# Patient Record
Sex: Female | Born: 1975 | Hispanic: No | Marital: Married | Smoking: Current some day smoker
Health system: Southern US, Community
[De-identification: ages and names within clinical notes are randomized; demographics above are authoritative.]

## PROBLEM LIST (undated history)

## (undated) DIAGNOSIS — Z5189 Encounter for other specified aftercare: Secondary | ICD-10-CM

## (undated) DIAGNOSIS — E785 Hyperlipidemia, unspecified: Secondary | ICD-10-CM

## (undated) DIAGNOSIS — K5900 Constipation, unspecified: Secondary | ICD-10-CM

## (undated) DIAGNOSIS — F909 Attention-deficit hyperactivity disorder, unspecified type: Secondary | ICD-10-CM

## (undated) DIAGNOSIS — R159 Full incontinence of feces: Secondary | ICD-10-CM

## (undated) DIAGNOSIS — E1065 Type 1 diabetes mellitus with hyperglycemia: Secondary | ICD-10-CM

## (undated) DIAGNOSIS — IMO0002 Reserved for concepts with insufficient information to code with codable children: Secondary | ICD-10-CM

## (undated) DIAGNOSIS — K219 Gastro-esophageal reflux disease without esophagitis: Secondary | ICD-10-CM

## (undated) DIAGNOSIS — F4310 Post-traumatic stress disorder, unspecified: Secondary | ICD-10-CM

## (undated) DIAGNOSIS — D649 Anemia, unspecified: Secondary | ICD-10-CM

## (undated) HISTORY — DX: Post-traumatic stress disorder, unspecified: F43.10

## (undated) HISTORY — DX: Hyperlipidemia, unspecified: E78.5

## (undated) HISTORY — PX: TUBAL LIGATION: SHX77

## (undated) HISTORY — DX: Constipation, unspecified: K59.00

## (undated) HISTORY — DX: Type 1 diabetes mellitus with hyperglycemia: E10.65

## (undated) HISTORY — DX: Encounter for other specified aftercare: Z51.89

## (undated) HISTORY — DX: Full incontinence of feces: R15.9

## (undated) HISTORY — DX: Reserved for concepts with insufficient information to code with codable children: IMO0002

---

## 1998-01-30 ENCOUNTER — Emergency Department (HOSPITAL_COMMUNITY): Admission: EM | Admit: 1998-01-30 | Discharge: 1998-01-30 | Payer: Self-pay | Admitting: Emergency Medicine

## 1998-02-13 ENCOUNTER — Inpatient Hospital Stay (HOSPITAL_COMMUNITY): Admission: AD | Admit: 1998-02-13 | Discharge: 1998-02-13 | Payer: Self-pay | Admitting: *Deleted

## 1998-03-30 ENCOUNTER — Emergency Department (HOSPITAL_COMMUNITY): Admission: EM | Admit: 1998-03-30 | Discharge: 1998-03-30 | Payer: Self-pay | Admitting: Emergency Medicine

## 1998-09-08 ENCOUNTER — Emergency Department (HOSPITAL_COMMUNITY): Admission: EM | Admit: 1998-09-08 | Discharge: 1998-09-08 | Payer: Self-pay | Admitting: Emergency Medicine

## 1998-10-17 ENCOUNTER — Encounter: Payer: Self-pay | Admitting: Emergency Medicine

## 1998-10-17 ENCOUNTER — Emergency Department (HOSPITAL_COMMUNITY): Admission: EM | Admit: 1998-10-17 | Discharge: 1998-10-17 | Payer: Self-pay | Admitting: Emergency Medicine

## 2000-03-16 ENCOUNTER — Emergency Department (HOSPITAL_COMMUNITY): Admission: EM | Admit: 2000-03-16 | Discharge: 2000-03-16 | Payer: Self-pay | Admitting: Emergency Medicine

## 2000-05-27 ENCOUNTER — Emergency Department (HOSPITAL_COMMUNITY): Admission: EM | Admit: 2000-05-27 | Discharge: 2000-05-27 | Payer: Self-pay

## 2000-08-28 ENCOUNTER — Emergency Department (HOSPITAL_COMMUNITY): Admission: EM | Admit: 2000-08-28 | Discharge: 2000-08-28 | Payer: Self-pay | Admitting: Emergency Medicine

## 2001-02-08 ENCOUNTER — Emergency Department (HOSPITAL_COMMUNITY): Admission: EM | Admit: 2001-02-08 | Discharge: 2001-02-08 | Payer: Self-pay | Admitting: Emergency Medicine

## 2001-02-16 ENCOUNTER — Encounter: Payer: Self-pay | Admitting: Obstetrics

## 2001-02-16 ENCOUNTER — Ambulatory Visit (HOSPITAL_COMMUNITY): Admission: RE | Admit: 2001-02-16 | Discharge: 2001-02-16 | Payer: Self-pay | Admitting: Obstetrics

## 2001-10-12 ENCOUNTER — Encounter: Payer: Self-pay | Admitting: Emergency Medicine

## 2001-10-12 ENCOUNTER — Emergency Department (HOSPITAL_COMMUNITY): Admission: EM | Admit: 2001-10-12 | Discharge: 2001-10-12 | Payer: Self-pay | Admitting: Emergency Medicine

## 2001-10-14 ENCOUNTER — Ambulatory Visit (HOSPITAL_COMMUNITY): Admission: RE | Admit: 2001-10-14 | Discharge: 2001-10-14 | Payer: Self-pay | Admitting: Obstetrics

## 2010-07-23 DIAGNOSIS — E1065 Type 1 diabetes mellitus with hyperglycemia: Secondary | ICD-10-CM | POA: Diagnosis present

## 2014-11-15 DIAGNOSIS — Z2821 Immunization not carried out because of patient refusal: Secondary | ICD-10-CM | POA: Insufficient documentation

## 2017-05-26 DIAGNOSIS — F4312 Post-traumatic stress disorder, chronic: Secondary | ICD-10-CM | POA: Insufficient documentation

## 2017-10-16 ENCOUNTER — Other Ambulatory Visit: Payer: Self-pay

## 2017-10-16 ENCOUNTER — Inpatient Hospital Stay (HOSPITAL_COMMUNITY)
Admission: EM | Admit: 2017-10-16 | Discharge: 2017-10-17 | DRG: 639 | Disposition: A | Discharge status: Home / Self Care | Payer: Medicaid Other | Attending: Family Medicine | Admitting: Family Medicine

## 2017-10-16 ENCOUNTER — Encounter (HOSPITAL_COMMUNITY): Payer: Self-pay | Admitting: Emergency Medicine

## 2017-10-16 DIAGNOSIS — Z882 Allergy status to sulfonamides status: Secondary | ICD-10-CM

## 2017-10-16 DIAGNOSIS — Z794 Long term (current) use of insulin: Secondary | ICD-10-CM | POA: Diagnosis not present

## 2017-10-16 DIAGNOSIS — Z9114 Patient's other noncompliance with medication regimen: Secondary | ICD-10-CM

## 2017-10-16 DIAGNOSIS — F17210 Nicotine dependence, cigarettes, uncomplicated: Secondary | ICD-10-CM | POA: Diagnosis present

## 2017-10-16 DIAGNOSIS — Z599 Problem related to housing and economic circumstances, unspecified: Secondary | ICD-10-CM | POA: Diagnosis not present

## 2017-10-16 DIAGNOSIS — F4310 Post-traumatic stress disorder, unspecified: Secondary | ICD-10-CM | POA: Diagnosis present

## 2017-10-16 DIAGNOSIS — E1010 Type 1 diabetes mellitus with ketoacidosis without coma: Principal | ICD-10-CM | POA: Diagnosis present

## 2017-10-16 LAB — BLOOD GAS, VENOUS
ACID-BASE DEFICIT: 18.3 mmol/L — AB (ref 0.0–2.0)
Bicarbonate: 9.2 mmol/L — ABNORMAL LOW (ref 20.0–28.0)
O2 SAT: 46.1 %
PCO2 VEN: 25.3 mmHg — AB (ref 44.0–60.0)
Patient temperature: 98.6
pH, Ven: 7.185 — CL (ref 7.250–7.430)

## 2017-10-16 LAB — URINALYSIS, ROUTINE W REFLEX MICROSCOPIC
BILIRUBIN URINE: NEGATIVE
Glucose, UA: >=500 mg/dL — AB
HGB URINE DIPSTICK: NEGATIVE
Ketones, ur: 80 mg/dL — AB
Leukocytes, UA: NEGATIVE
NITRITE: NEGATIVE
Protein, ur: NEGATIVE mg/dL
SPECIFIC GRAVITY, URINE: 1.017 (ref 1.005–1.030)
pH: 5 (ref 5.0–8.0)

## 2017-10-16 LAB — BASIC METABOLIC PANEL
ANION GAP: 24 — AB (ref 5–15)
ANION GAP: 10 (ref 5–15)
Anion gap: 10 (ref 5–15)
BUN: 25 mg/dL — AB (ref 6–20)
BUN: 16 mg/dL (ref 6–20)
BUN: 19 mg/dL (ref 6–20)
CHLORIDE: 108 mmol/L (ref 101–111)
CO2: 9 mmol/L — AB (ref 22–32)
CO2: 18 mmol/L — AB (ref 22–32)
CO2: 18 mmol/L — ABNORMAL LOW (ref 22–32)
CREATININE: 0.63 mg/dL (ref 0.44–1.00)
Calcium: 8.6 mg/dL — ABNORMAL LOW (ref 8.9–10.3)
Calcium: 8.3 mg/dL — ABNORMAL LOW (ref 8.9–10.3)
Calcium: 8.3 mg/dL — ABNORMAL LOW (ref 8.9–10.3)
Chloride: 111 mmol/L (ref 101–111)
Chloride: 110 mmol/L (ref 101–111)
Creatinine, Ser: 1.18 mg/dL — ABNORMAL HIGH (ref 0.44–1.00)
Creatinine, Ser: 0.65 mg/dL (ref 0.44–1.00)
GFR calc Af Amer: >60 mL/min (ref >60)
GFR calc Af Amer: >60 mL/min (ref >60)
GFR calc non Af Amer: 56 mL/min — ABNORMAL LOW (ref >60)
GFR calc non Af Amer: >60 mL/min (ref >60)
GLUCOSE: 378 mg/dL — AB (ref 65–99)
GLUCOSE: 134 mg/dL — AB (ref 65–99)
Glucose, Bld: 151 mg/dL — ABNORMAL HIGH (ref 65–99)
POTASSIUM: 4.5 mmol/L (ref 3.5–5.1)
POTASSIUM: 4 mmol/L (ref 3.5–5.1)
Potassium: 4.2 mmol/L (ref 3.5–5.1)
SODIUM: 138 mmol/L (ref 135–145)
Sodium: 141 mmol/L (ref 135–145)
Sodium: 139 mmol/L (ref 135–145)

## 2017-10-16 LAB — CBG MONITORING, ED
GLUCOSE-CAPILLARY: 507 mg/dL — AB (ref 65–99)
Glucose-Capillary: 251 mg/dL — ABNORMAL HIGH (ref 65–99)
Glucose-Capillary: 241 mg/dL — ABNORMAL HIGH (ref 65–99)
Glucose-Capillary: 279 mg/dL — ABNORMAL HIGH (ref 65–99)
Glucose-Capillary: 154 mg/dL — ABNORMAL HIGH (ref 65–99)

## 2017-10-16 LAB — GLUCOSE, CAPILLARY
GLUCOSE-CAPILLARY: 109 mg/dL — AB (ref 65–99)
GLUCOSE-CAPILLARY: 114 mg/dL — AB (ref 65–99)
Glucose-Capillary: 200 mg/dL — ABNORMAL HIGH (ref 65–99)
Glucose-Capillary: 222 mg/dL — ABNORMAL HIGH (ref 65–99)
Glucose-Capillary: 153 mg/dL — ABNORMAL HIGH (ref 65–99)
Glucose-Capillary: 113 mg/dL — ABNORMAL HIGH (ref 65–99)

## 2017-10-16 LAB — MRSA PCR SCREENING: MRSA BY PCR: NEGATIVE

## 2017-10-16 LAB — CBC
HEMATOCRIT: 38.8 % (ref 36.0–46.0)
Hemoglobin: 12.9 g/dL (ref 12.0–15.0)
MCH: 30.9 pg (ref 26.0–34.0)
MCHC: 33.2 g/dL (ref 30.0–36.0)
MCV: 93 fL (ref 78.0–100.0)
Platelets: 313 10*3/uL (ref 150–400)
RBC: 4.17 MIL/uL (ref 3.87–5.11)
RDW: 13.1 % (ref 11.5–15.5)
WBC: 6.4 10*3/uL (ref 4.0–10.5)

## 2017-10-16 LAB — I-STAT CHEM 8, ED
BUN: 21 mg/dL — AB (ref 6–20)
CHLORIDE: 109 mmol/L (ref 101–111)
CREATININE: 0.6 mg/dL (ref 0.44–1.00)
Calcium, Ion: 1.16 mmol/L (ref 1.15–1.40)
GLUCOSE: 367 mg/dL — AB (ref 65–99)
HCT: 40 % (ref 36.0–46.0)
Hemoglobin: 13.6 g/dL (ref 12.0–15.0)
POTASSIUM: 4.3 mmol/L (ref 3.5–5.1)
Sodium: 140 mmol/L (ref 135–145)
TCO2: 11 mmol/L — ABNORMAL LOW (ref 22–32)

## 2017-10-16 LAB — PHOSPHORUS: PHOSPHORUS: 2.8 mg/dL (ref 2.5–4.6)

## 2017-10-16 LAB — I-STAT BETA HCG BLOOD, ED (MC, WL, AP ONLY): I-stat hCG, quantitative: <5 m[IU]/mL (ref <5)

## 2017-10-16 LAB — HEMOGLOBIN A1C
HEMOGLOBIN A1C: 11.8 % — AB (ref 4.8–5.6)
Mean Plasma Glucose: 291.96 mg/dL

## 2017-10-16 LAB — MAGNESIUM: Magnesium: 1.7 mg/dL (ref 1.7–2.4)

## 2017-10-16 MED ORDER — DEXTROSE-NACL 5-0.45 % IV SOLN
INTRAVENOUS | Status: DC
  Administered 2017-10-16: 15:00:00 via INTRAVENOUS

## 2017-10-16 MED ORDER — ENOXAPARIN SODIUM 40 MG/0.4ML ~~LOC~~ SOLN
40.0000 mg | SUBCUTANEOUS | Status: DC
  Administered 2017-10-16: 40 mg via SUBCUTANEOUS
  Filled 2017-10-16: qty 0.4

## 2017-10-16 MED ORDER — DEXTROSE-NACL 5-0.45 % IV SOLN
INTRAVENOUS | Status: DC
  Administered 2017-10-16: 18:00:00 via INTRAVENOUS

## 2017-10-16 MED ORDER — POTASSIUM CHLORIDE 10 MEQ/100ML IV SOLN
10.0000 meq | INTRAVENOUS | Status: AC
  Administered 2017-10-16 (×2): 10 meq via INTRAVENOUS
  Filled 2017-10-16 (×2): qty 100

## 2017-10-16 MED ORDER — SODIUM CHLORIDE 0.9 % IV BOLUS
1000.0000 mL | Freq: Once | INTRAVENOUS | Status: AC
  Administered 2017-10-16: 1000 mL via INTRAVENOUS

## 2017-10-16 MED ORDER — SODIUM CHLORIDE 0.9 % IV SOLN
INTRAVENOUS | Status: DC
  Administered 2017-10-16: 14:00:00 via INTRAVENOUS

## 2017-10-16 MED ORDER — ONDANSETRON HCL 4 MG/2ML IJ SOLN
4.0000 mg | Freq: Once | INTRAMUSCULAR | Status: AC
  Administered 2017-10-16: 4 mg via INTRAVENOUS
  Filled 2017-10-16: qty 2

## 2017-10-16 MED ORDER — SODIUM CHLORIDE 0.9 % IV SOLN
INTRAVENOUS | Status: DC
  Administered 2017-10-16: 1.8 [IU]/h via INTRAVENOUS
  Administered 2017-10-16: 4.4 [IU]/h via INTRAVENOUS
  Filled 2017-10-16: qty 1

## 2017-10-16 NOTE — H&P (Signed)
History and Physical  Connie Vaughn:[id number] DOB: 06/20/1976 DOA: 10/16/2017  Referring physician: Dr Erma Heritage PCP: Patient, No Pcp Per  Outpatient Specialists:  Patient coming from: Home  Chief Complaint: Nausea, vomiting  HPI: Connie Vaughn is a 42 y.o. female with medical history significant for type 1 diabetes, PTSD, previous DKA who presented to [facility] ED with complaints of persistent nausea and vomiting of 2-days duration.  Ran out of Lantus a week ago and used her NovoLog instead due to financial constraints.  Denies any subjective fevers, chills, or cough.  Admits to polyuria and loose stools.  ED Course: DKA with anion gap 24 and metabolic acidosis with bicarb of 9.  PH of 7.1.  Afebrile with no leukocytosis.  Review of Systems: Review of systems as noted in the HPI.  All other systems reviewed and are negative.   Past Medical History:  Diagnosis Date  . Diabetes mellitus without complication [facility])    Past Surgical History:  Procedure Laterality Date  . TUBAL LIGATION      Social History:  reports that she has been smoking cigarettes.  She has never used smokeless tobacco. She reports that she does not drink alcohol or use drugs.   Allergies  Allergen Reactions  . Sulfa Antibiotics Hives    History reviewed. No pertinent family history.    Prior to Admission medications   Medication Sig Start Date End Date Taking? Authorizing Provider  insulin aspart (NOVOLOG) 100 UNIT/ML injection Inject 10-12 Units into the skin 3 (three) times daily before meals.   Yes [provider]  insulin glargine (LANTUS) 100 UNIT/ML injection Inject 30 Units into the skin at bedtime.   Yes [provider]    Physical Exam: BP (!) 98/58   Pulse (!) 107   Temp (!) 97.5 F (36.4 C) (Oral)   Resp 20   Ht 5\' 4"  (1.626 m)   Wt 61.2 kg (135 lb)   SpO2 99%   BMI 23.17 kg/m   General: 42 year old female well-developed well-nourished in no acute distress.  Alert  and oriented x3. Eyes: Anicteric sclerae. ENT: Dry mucous membranes.  With no erythema or exudates. Neck: No JVD or thyromegaly. Cardiovascular: Regular rate and rhythm with no rubs or gallops. Respiratory: Clear to auscultation with no wheezes or rales. Abdomen: Soft nontender nondistended with normal bowel sounds x4 quadrant. Skin: No ulcerative lesions or rashes. Musculoskeletal: No lower extremity edema.  Moves all 4 extremities. Psychiatric: Mood is appropriate for conditioning and setting. Neurologic: Alert and oriented x3.  No focal motor deficits.          Labs on Admission:  Basic Metabolic Panel: Recent Labs  Lab 10/16/17 1305 10/16/17 1312  NA 141 140  K 4.5 4.3  CL 108 109  CO2 9*  --   GLUCOSE 378* 367*  BUN 25* 21*  CREATININE 1.18* 0.60  CALCIUM 8.6*  --    Liver Function Tests: No results for input(s): AST, ALT, ALKPHOS, BILITOT, PROT, ALBUMIN in the last 168 hours. No results for input(s): LIPASE, AMYLASE in the last 168 hours. No results for input(s): AMMONIA in the last 168 hours. CBC: Recent Labs  Lab 10/16/17 1305 10/16/17 1312  WBC 6.4  --   HGB 12.9 13.6  HCT 38.8 40.0  MCV 93.0  --   PLT 313  --    Cardiac Enzymes: No results for input(s): CKTOTAL, CKMB, CKMBINDEX, TROPONINI in the last 168 hours.  BNP (last 3 results) No results  for input(s): BNP in the last 8760 hours.  ProBNP (last 3 results) No results for input(s): PROBNP in the last 8760 hours.  CBG: Recent Labs  Lab 10/16/17 1218 10/16/17 1421 10/16/17 1451 10/16/17 1606  GLUCAP 507* 251* 241* 279*    Radiological Exams on Admission: No results found.  EKG: Independently reviewed.  Personally reviewed and revealed sinus tachycardia at a rate of 120.  Assessment/Plan Present on Admission: . DKA, type 1 (HCC)  Active Problems:   DKA, type 1 (HCC)  Type I DKA secondary to noncompliance due to financial constraints DKA protocol in place BMP every 4  hours Transition to long-acting lantus once anion gap closes between 8 and 10.  Then continue IV insulin for 2 hours while allowing the patient to eat. Notify MD once anion gap closes Start insulin sliding scale when off insulin drip Obtain hemoglobin A1c Case manager consulted to assist with medications, lantus and novolog  Metabolic acidosis secondary to DKA Continue IV fluid Continue insulin drip Continue BMP every 4 hours Monitor urine output  Noncompliance due to financial constraints Case manager to assist with medications    DVT prophylaxis: Subcu Lovenox daily  Code Status: Full code  Family Communication: Husband at bedside  Disposition Plan: Admit to ICU  Consults called: None  Admission status: Inpatient    Darlin Droparole N Marci Polito MD [facility] Pager [phone]  If 7PM-7AM, please contact night-coverage [email or web address] Password [email or web address]  10/16/2017, 4:22 PM

## 2017-10-16 NOTE — Progress Notes (Signed)
Inpatient Diabetes Program Recommendations  AACE/ADA: New Consensus Statement on Inpatient Glycemic Control (2015)  Target Ranges:  Prepandial:   less than 140 mg/dL      Peak postprandial:   less than 180 mg/dL (1-2 hours)      Critically ill patients:  140 - 180 mg/dL   Lab Results  Component Value Date   GLUCAP 279 (H) 10/16/2017    Review of Glycemic Control  Diabetes history: DM1 Outpatient Diabetes medications: Lantus 30 units QHS, Novolog 10-12 units tidwc Current orders for Inpatient glycemic control: IV insulin per DKA order set  The [place]  She was previously on charity care there but has been unable to have regular access to insulin since September.  She has been intermittently using her friend's insulin.  She says she has been using short acting only and has not had any long-acting recently.    Inpatient Diabetes Program Recommendations:     Continue IV insulin until criteria met for discontinuation of drip. Will need care management consult for medication assistance and PCP HgbA1C - please order. Since pt is Type 1, will need meal coverage insulin when eating - 4 units tidwc. Give Lantus 15-20 units 1-2 hours before discontinuation of drip. Add Novolog 0-9 units tidwc and hs when drip is discontinued.  Will follow.  Thank you. Lorenda Peck, RD, LDN, CDE Inpatient Diabetes Coordinator [phone]

## 2017-10-16 NOTE — ED Provider Notes (Signed)
[facility]-EMERGENCY DEPT Provider Note   CSN: [id number] Arrival date & time: 10/16/17  1207     History   Chief Complaint Chief Complaint  Patient presents with  . Hyperglycemia    HPI Connie Vaughn is a 42 y.o. female.  HPI 42 year old female with history of type 1 diabetes here with generalized weakness.  The patient reportedly just moved from another county.  She was previously on charity care there but has been unable to have regular access to insulin since September.  She has been intermittently using her friend's insulin.  She says she has been using short acting only and has not had any long-acting recently.  Patient states that over the last week, she said progressive worsening polyuria, polydipsia, and polyphagia.  She has had shortness of breath.  No cough.  No fevers.  No chest pain.  No abdominal pain.  She has had some intermittent nausea. H/o DKA and sx feel similar today. No pain currently.   Past Medical History:  Diagnosis Date  . Diabetes mellitus without complication (HCC)     There are no active problems to display for this patient.   History reviewed. No pertinent surgical history.   OB History   None      Home Medications    Prior to Admission medications   Medication Sig Start Date End Date Taking? Authorizing Provider  insulin aspart (NOVOLOG) 100 UNIT/ML injection Inject 10-12 Units into the skin 3 (three) times daily before meals.   Yes [provider]  insulin glargine (LANTUS) 100 UNIT/ML injection Inject 30 Units into the skin at bedtime.   Yes [provider]    Family History No family history on file.  Social History Social History   Tobacco Use  . Smoking status: Not on file  Substance Use Topics  . Alcohol use: Not on file  . Drug use: Not on file     Allergies   Sulfa antibiotics   Review of Systems Review of Systems  Constitutional: Positive for fatigue.  Respiratory:  Positive for shortness of breath.   Endocrine: Positive for polydipsia, polyphagia and polyuria.  Neurological: Positive for weakness.  All other systems reviewed and are negative.    Physical Exam Updated Vital Signs BP (!) 98/58   Pulse (!) 107   Temp (!) 97.5 F (36.4 C) (Oral)   Resp 20   Ht 5\' 4"  (1.626 m)   Wt 61.2 kg (135 lb)   SpO2 99%   BMI 23.17 kg/m   Physical Exam  Constitutional: She is oriented to person, place, and time. She appears well-developed and well-nourished. She appears distressed.  HENT:  Head: Normocephalic and atraumatic.  Dry MM  Eyes: Conjunctivae are normal.  Neck: Neck supple.  Cardiovascular: Regular rhythm and normal heart sounds. Tachycardia present. Exam reveals no friction rub.  No murmur heard. Pulmonary/Chest: Effort normal and breath sounds normal. Tachypnea noted. No respiratory distress. She has no wheezes. She has no rales.  Abdominal: She exhibits no distension.  Musculoskeletal: She exhibits no edema.  Neurological: She is alert and oriented to person, place, and time. She exhibits normal muscle tone.  Skin: Skin is warm. Capillary refill takes less than 2 seconds.  Psychiatric: She has a normal mood and affect.  Nursing note and vitals reviewed.    ED Treatments / Results  Labs (all labs ordered are listed, but only abnormal results are displayed) Labs Reviewed  BASIC METABOLIC PANEL - Abnormal; Notable for  the following components:      Result Value   CO2 9 (*)    Glucose, Bld 378 (*)    BUN 25 (*)    Creatinine, Ser 1.18 (*)    Calcium 8.6 (*)    GFR calc non Af Amer 56 (*)    Anion gap 24 (*)    All other components within normal limits  URINALYSIS, ROUTINE W REFLEX MICROSCOPIC - Abnormal; Notable for the following components:   Color, Urine STRAW (*)    Glucose, UA >=500 (*)    Ketones, ur 80 (*)    Bacteria, UA RARE (*)    Squamous Epithelial / LPF 0-5 (*)    All other components within normal limits  BLOOD  GAS, VENOUS - Abnormal; Notable for the following components:   pH, Ven 7.185 (*)    pCO2, Ven 25.3 (*)    Bicarbonate 9.2 (*)    Acid-base deficit 18.3 (*)    All other components within normal limits  CBG MONITORING, ED - Abnormal; Notable for the following components:   Glucose-Capillary 507 (*)    All other components within normal limits  CBG MONITORING, ED - Abnormal; Notable for the following components:   Glucose-Capillary 251 (*)    All other components within normal limits  I-STAT CHEM 8, ED - Abnormal; Notable for the following components:   BUN 21 (*)    Glucose, Bld 367 (*)    TCO2 11 (*)    All other components within normal limits  CBG MONITORING, ED - Abnormal; Notable for the following components:   Glucose-Capillary 241 (*)    All other components within normal limits  CBC  I-STAT BETA HCG BLOOD, ED (MC, WL, AP ONLY)    EKG EKG Interpretation  Date/Time:  Friday October 16 2017 12:41:46 EDT Ventricular Rate:  120 PR Interval:    QRS Duration: 106 QT Interval:  351 QTC Calculation: 496 R Axis:   135 Text Interpretation:  Sinus tachycardia Right axis deviation Borderline T wave abnormalities Borderline prolonged QT interval No old tracing to compare Confirmed by Shaune Pollack [phone]) on 10/16/2017 2:45:33 PM   Radiology No results found.  Procedures .Critical Care Performed by: Shaune Pollack, MD Authorized by: Shaune Pollack, MD   Critical care provider statement:    Critical care time (minutes):  35   Critical care time was exclusive of:  Separately billable procedures and treating other patients and teaching time   Critical care was necessary to treat or prevent imminent or life-threatening deterioration of the following conditions:  Dehydration, metabolic crisis and endocrine crisis   Critical care was time spent personally by me on the following activities:  Development of treatment plan with patient or surrogate, discussions with consultants,  evaluation of patient's response to treatment, examination of patient, obtaining history from patient or surrogate, ordering and performing treatments and interventions, ordering and review of laboratory studies, ordering and review of radiographic studies, pulse oximetry, re-evaluation of patient's condition and review of old charts   I assumed direction of critical care for this patient from another provider in my specialty: no     (including critical care time)  Medications Ordered in ED Medications  insulin regular (NOVOLIN R,HUMULIN R) 100 Units in sodium chloride 0.9 % 100 mL (1 Units/mL) infusion (1.8 Units/hr Intravenous New Bag/Given 10/16/17 1512)  dextrose 5 %-0.45 % sodium chloride infusion (has no administration in time range)  potassium chloride 10 mEq in 100 mL IVPB (10 mEq Intravenous  New Bag/Given 10/16/17 1510)  0.9 %  sodium chloride infusion ( Intravenous New Bag/Given 10/16/17 1414)  dextrose 5 %-0.45 % sodium chloride infusion ( Intravenous New Bag/Given 10/16/17 1510)  sodium chloride 0.9 % bolus 1,000 mL (0 mLs Intravenous Stopped 10/16/17 1253)  ondansetron (ZOFRAN) injection 4 mg (4 mg Intravenous Given 10/16/17 1417)     Initial Impression / Assessment and Plan / ED Course  I have reviewed the triage vital signs and the nursing notes.  Pertinent labs & imaging results that were available during my care of the patient were reviewed by me and considered in my medical decision making (see chart for details).  Clinical Course as of Oct 16 1525  Fri Oct 16, 2017  [id number] 42 year old type I diabetic here with hyperglycemia and shortness of breath in setting of running out of her insulin.  Concern for DKA.  Patient appears significantly dehydrated clinically.  She is been given 2 L of fluid while awaiting a room.  Will plan to follow-up stat labs, blood gas, and likely need for insulin drip.  No apparent infectious triggers.   [CI]  1242 Patient frustrated with nursing care.  I  had a long discussion with her as well as nursing.  Will also have social work contact her regarding need to follow-up with [place] insurance here in [place].  She previously received charity care in [place].   [CI]  1444 Lab work shows bicarb of 9, anion gap of 24, consistent with DKA.  Mild dehydration.  pH 7.19.  Patient has been given 2 L of fluid.  Will start her on glucose stabilizer.  Supplemental potassium provided.  Admit to stepdown.   [CI]    Clinical Course User Index [CI] Shaune PollackIsaacs, Miko Markwood, MD     Final Clinical Impressions(s) / ED Diagnoses   Final diagnoses:  Diabetic ketoacidosis without coma associated with type 1 diabetes mellitus [facility](HCC)    ED Discharge Orders    None       Shaune PollackIsaacs, Yeraldine Forney, MD 10/16/17 1527

## 2017-10-16 NOTE — ED Notes (Signed)
CRITICAL VALUE STICKER  CRITICAL VALUE: pH 7.185  RECEIVER (on-site recipient of call): Jake T  DATE & TIME NOTIFIED: 1335  MESSENGER (representative from lab): respiratory therapy  MD NOTIFIED: Isaacs  TIME OF NOTIFICATION: 1340  RESPONSE: See orders

## 2017-10-16 NOTE — Progress Notes (Signed)
When bedside rounds were made patient was noted to have two trays in room. States the ED gave them to her.  Patient has not had a diet ordered and is in DKA and on an insulin drip. Glucose levels were in range to stop insulin drip at last reading. Explained to patient that she is NPO and will probably have to restart drip at next glucose check. Connie ColaceMelinda P Klohe Lovering, RN-C

## 2017-10-16 NOTE — ED Notes (Signed)
Bed: WLPT4 Expected date:  Expected time:  Means of arrival:  Comments: 

## 2017-10-16 NOTE — ED Notes (Signed)
Bed: WA08 Expected date:  Expected time:  Means of arrival:  Comments: TR 4 per Erma HeritageIsaacs

## 2017-10-16 NOTE — ED Triage Notes (Signed)
Per EMS pt out of insulin for a week. CBG reading high; pt given one liter of normal saline with EMS.

## 2017-10-16 NOTE — ED Notes (Signed)
ED TO INPATIENT HANDOFF REPORT  Name/Age/Gender Connie Vaughn 42 y.o. female  Code Status   Home/SNF/Other Home  Chief Complaint Hypeglycemia   Level of Care/Admitting Diagnosis ED Disposition    ED Disposition Condition [facility] Area: [facility] [[id number]]  Level of Care: Stepdown [14]  Admit to SDU based on following criteria: Hemodynamic compromise or significant risk of instability:  Patient requiring short term acute titration and management of vasoactive drips, and invasive monitoring (i.e., CVP and Arterial line).  Diagnosis: DKA, type 1 [facility]) [[id number]]  Admitting Physician: Kayleen Memos [[id number]]  Attending Physician: Kayleen Memos [phone]  Estimated length of stay: past midnight tomorrow  Certification:: I certify this patient will need inpatient services for at least 2 midnights  PT Class (Do Not Modify): Inpatient [101]  PT Acc Code (Do Not Modify): Private [1]       Medical History Past Medical History:  Diagnosis Date  . Diabetes mellitus without complication (HCC)     Allergies Allergies  Allergen Reactions  . Sulfa Antibiotics Hives    IV Location/Drains/Wounds Patient Lines/Drains/Airways Status   Active Line/Drains/Airways    Name:   Placement date:   Placement time:   Site:   Days:   Peripheral IV 10/16/17 Left Forearm   10/16/17    1214    Forearm   less than 1   Peripheral IV 10/16/17 Left Hand   10/16/17    1414    Hand   less than 1   Peripheral IV 10/16/17 Right Antecubital   10/16/17    1516    Antecubital   less than 1          Labs/Imaging Results for orders placed or performed during the hospital encounter of 10/16/17 (from the past 48 hour(s))  CBG monitoring, ED     Status: Abnormal   Collection Time: 10/16/17 12:18 PM  Result Value Ref Range   Glucose-Capillary 507 (HH) 65 - 99 mg/dL   Comment 1 Notify RN    Comment 2 Document in Chart   Basic metabolic panel     Status: Abnormal   Collection Time: 10/16/17  1:05 PM  Result Value Ref Range   Sodium 141 135 - 145 mmol/L    Comment: REPEATED TO VERIFY   Potassium 4.5 3.5 - 5.1 mmol/L    Comment: REPEATED TO VERIFY   Chloride 108 101 - 111 mmol/L    Comment: REPEATED TO VERIFY   CO2 9 (L) 22 - 32 mmol/L    Comment: REPEATED TO VERIFY   Glucose, Bld 378 (H) 65 - 99 mg/dL   BUN 25 (H) 6 - 20 mg/dL   Creatinine, Ser 1.18 (H) 0.44 - 1.00 mg/dL   Calcium 8.6 (L) 8.9 - 10.3 mg/dL    Comment: REPEATED TO VERIFY   GFR calc non Af Amer 56 (L) >60 mL/min   GFR calc Af Amer >60 >60 mL/min    Comment: (NOTE) The eGFR has been calculated using the CKD EPI equation. This calculation has not been validated in all clinical situations. eGFR's persistently <60 mL/min signify possible Chronic Kidney Disease.    Anion gap 24 (H) 5 - 15    Comment: REPEATED TO VERIFY Performed at [facility], [place] [address] [place], [place] [address]   CBC     Status: None   Collection Time: 10/16/17  1:05 PM  Result Value Ref Range   WBC 6.4 4.0 -  10.5 K/uL   RBC 4.17 3.87 - 5.11 MIL/uL   Hemoglobin 12.9 12.0 - 15.0 g/dL   HCT 38.8 36.0 - 46.0 %   MCV 93.0 78.0 - 100.0 fL   MCH 30.9 26.0 - 34.0 pg   MCHC 33.2 30.0 - 36.0 g/dL   RDW 13.1 11.5 - 15.5 %   Platelets 313 150 - 400 K/uL    Comment: Performed at [facility], [place] [address], [place], [place] [address]  Blood gas, venous     Status: Abnormal   Collection Time: 10/16/17  1:05 PM  Result Value Ref Range   pH, Ven 7.185 (LL) 7.250 - 7.430    Comment: RBV DR. ISAACS AT 1326 BY T. BURGESS,RRT,RCP ON 10/16/2017    pCO2, Ven 25.3 (L) 44.0 - 60.0 mmHg   pO2, Ven BELOW REPORTABLE RANGE 32.0 - 45.0 mmHg    Comment: RBV DR. ISAACS AT 1326 BY T.BURGESS,RRT,RCP ON 04/19/209    Bicarbonate 9.2 (L) 20.0 - 28.0 mmol/L   Acid-base deficit 18.3 (H) 0.0 - 2.0 mmol/L   O2 Saturation 46.1 %   Patient temperature 98.6    Collection site VEIN    Drawn  by [place]    Sample type VENOUS     Comment: Performed at [facility], [place] [address], [place], [place] [address]  I-Stat beta hCG blood, ED     Status: None   Collection Time: 10/16/17  1:10 PM  Result Value Ref Range   I-stat hCG, quantitative <5.0 <5 mIU/mL   Comment 3            Comment:   GEST. AGE      CONC.  (mIU/mL)   <=1 WEEK        5 - 50     2 WEEKS       50 - 500     3 WEEKS       100 - 10,000     4 WEEKS     1,000 - 30,000        FEMALE AND NON-PREGNANT FEMALE:     LESS THAN 5 mIU/mL   I-Stat Chem 8, ED     Status: Abnormal   Collection Time: 10/16/17  1:12 PM  Result Value Ref Range   Sodium 140 135 - 145 mmol/L   Potassium 4.3 3.5 - 5.1 mmol/L   Chloride 109 101 - 111 mmol/L   BUN 21 (H) 6 - 20 mg/dL   Creatinine, Ser 0.60 0.44 - 1.00 mg/dL   Glucose, Bld 367 (H) 65 - 99 mg/dL   Calcium, Ion 1.16 1.15 - 1.40 mmol/L   TCO2 11 (L) 22 - 32 mmol/L   Hemoglobin 13.6 12.0 - 15.0 g/dL   HCT 40.0 36.0 - 46.0 %  Urinalysis, Routine w reflex microscopic     Status: Abnormal   Collection Time: 10/16/17  2:08 PM  Result Value Ref Range   Color, Urine STRAW (A) YELLOW   APPearance CLEAR CLEAR   Specific Gravity, Urine 1.017 1.005 - 1.030   pH 5.0 5.0 - 8.0   Glucose, UA >=500 (A) NEGATIVE mg/dL   Hgb urine dipstick NEGATIVE NEGATIVE   Bilirubin Urine NEGATIVE NEGATIVE   Ketones, ur 80 (A) NEGATIVE mg/dL   Protein, ur NEGATIVE NEGATIVE mg/dL   Nitrite NEGATIVE NEGATIVE   Leukocytes, UA NEGATIVE NEGATIVE   RBC / HPF 0-5 0 - 5 RBC/hpf   WBC, UA 0-5 0 - 5  WBC/hpf   Bacteria, UA RARE (A) NONE SEEN   Squamous Epithelial / LPF 0-5 (A) NONE SEEN   Mucus PRESENT     Comment: Performed at [facility], [place] [address], [place], [place] [address]  POC CBG, ED     Status: Abnormal   Collection Time: 10/16/17  2:21 PM  Result Value Ref Range   Glucose-Capillary 251 (H) 65 - 99 mg/dL  CBG monitoring, ED     Status: Abnormal    Collection Time: 10/16/17  2:51 PM  Result Value Ref Range   Glucose-Capillary 241 (H) 65 - 99 mg/dL  CBG monitoring, ED     Status: Abnormal   Collection Time: 10/16/17  4:06 PM  Result Value Ref Range   Glucose-Capillary 279 (H) 65 - 99 mg/dL  CBG monitoring, ED     Status: Abnormal   Collection Time: 10/16/17  5:07 PM  Result Value Ref Range   Glucose-Capillary 154 (H) 65 - 99 mg/dL   No results found.  Pending Labs Unresulted Labs (From admission, onward)   None      Vitals/Pain Today's Vitals   10/16/17 1530 10/16/17 1600 10/16/17 1630 10/16/17 1700  BP: 104/65 (!) 103/55 100/61 95/62  Pulse: (!) 114 (!) 110 (!) 104 (!) 106  Resp: '16 17 14 18  ' Temp:      TempSrc:      SpO2: 98% 99% 100% 99%  Weight:      Height:      PainSc:        Isolation Precautions No active isolations  Medications Medications  insulin regular (NOVOLIN R,HUMULIN R) 100 Units in sodium chloride 0.9 % 100 mL (1 Units/mL) infusion (1.9 Units/hr Intravenous Rate/Dose Change 10/16/17 1708)  dextrose 5 %-0.45 % sodium chloride infusion (has no administration in time range)  potassium chloride 10 mEq in 100 mL IVPB (10 mEq Intravenous New Bag/Given 10/16/17 1633)  0.9 %  sodium chloride infusion ( Intravenous New Bag/Given 10/16/17 1414)  dextrose 5 %-0.45 % sodium chloride infusion ( Intravenous New Bag/Given 10/16/17 1510)  sodium chloride 0.9 % bolus 1,000 mL (0 mLs Intravenous Stopped 10/16/17 1253)  ondansetron (ZOFRAN) injection 4 mg (4 mg Intravenous Given 10/16/17 1417)    Mobility walks with person assist

## 2017-10-17 LAB — BASIC METABOLIC PANEL
ANION GAP: 9 (ref 5–15)
Anion gap: 9 (ref 5–15)
BUN: 18 mg/dL (ref 6–20)
BUN: 14 mg/dL (ref 6–20)
CHLORIDE: 110 mmol/L (ref 101–111)
CHLORIDE: 109 mmol/L (ref 101–111)
CO2: 19 mmol/L — ABNORMAL LOW (ref 22–32)
CO2: 20 mmol/L — ABNORMAL LOW (ref 22–32)
Calcium: 8.3 mg/dL — ABNORMAL LOW (ref 8.9–10.3)
Calcium: 8.3 mg/dL — ABNORMAL LOW (ref 8.9–10.3)
Creatinine, Ser: 0.57 mg/dL (ref 0.44–1.00)
Creatinine, Ser: 0.53 mg/dL (ref 0.44–1.00)
GFR calc Af Amer: >60 mL/min (ref >60)
GFR calc Af Amer: >60 mL/min (ref >60)
GFR calc non Af Amer: >60 mL/min (ref >60)
GFR calc non Af Amer: >60 mL/min (ref >60)
GLUCOSE: 151 mg/dL — AB (ref 65–99)
Glucose, Bld: 132 mg/dL — ABNORMAL HIGH (ref 65–99)
POTASSIUM: 3.7 mmol/L (ref 3.5–5.1)
POTASSIUM: 3.7 mmol/L (ref 3.5–5.1)
SODIUM: 138 mmol/L (ref 135–145)
SODIUM: 138 mmol/L (ref 135–145)

## 2017-10-17 LAB — GLUCOSE, CAPILLARY
GLUCOSE-CAPILLARY: 130 mg/dL — AB (ref 65–99)
GLUCOSE-CAPILLARY: 152 mg/dL — AB (ref 65–99)
Glucose-Capillary: 110 mg/dL — ABNORMAL HIGH (ref 65–99)
Glucose-Capillary: 112 mg/dL — ABNORMAL HIGH (ref 65–99)
Glucose-Capillary: 114 mg/dL — ABNORMAL HIGH (ref 65–99)
Glucose-Capillary: 116 mg/dL — ABNORMAL HIGH (ref 65–99)
Glucose-Capillary: 144 mg/dL — ABNORMAL HIGH (ref 65–99)
Glucose-Capillary: 152 mg/dL — ABNORMAL HIGH (ref 65–99)
Glucose-Capillary: 170 mg/dL — ABNORMAL HIGH (ref 65–99)
Glucose-Capillary: 246 mg/dL — ABNORMAL HIGH (ref 65–99)

## 2017-10-17 LAB — MAGNESIUM: Magnesium: 1.8 mg/dL (ref 1.7–2.4)

## 2017-10-17 MED ORDER — INSULIN GLARGINE 100 UNIT/ML ~~LOC~~ SOLN
30.0000 [IU] | Freq: Every day | SUBCUTANEOUS | Status: DC
  Administered 2017-10-17: 30 [IU] via SUBCUTANEOUS
  Filled 2017-10-17: qty 0.3

## 2017-10-17 MED ORDER — INSULIN ASPART PROT & ASPART (70-30 MIX) 100 UNIT/ML ~~LOC~~ SUSP: 30.0000 [IU] | Freq: Every day | SUBCUTANEOUS | Status: DC

## 2017-10-17 MED ORDER — INSULIN ASPART 100 UNIT/ML ~~LOC~~ SOLN: 10.0000 [IU] | Freq: Three times a day (TID) | SUBCUTANEOUS | 1 refills | Status: DC

## 2017-10-17 MED ORDER — INSULIN ASPART 100 UNIT/ML ~~LOC~~ SOLN
0.0000 [IU] | SUBCUTANEOUS | Status: DC
  Administered 2017-10-17: 5 [IU] via SUBCUTANEOUS

## 2017-10-17 MED ORDER — INSULIN GLARGINE 100 UNIT/ML ~~LOC~~ SOLN: 30.0000 [IU] | Freq: Every day | SUBCUTANEOUS | 1 refills | Status: DC

## 2017-10-17 MED ORDER — INSULIN ASPART 100 UNIT/ML ~~LOC~~ SOLN
4.0000 [IU] | Freq: Three times a day (TID) | SUBCUTANEOUS | Status: DC
  Administered 2017-10-17: 4 [IU] via SUBCUTANEOUS

## 2017-10-17 NOTE — Discharge Summary (Signed)
Physician Discharge Summary  Connie Vaughn  ZOX:[id number]  DOB: 11-15-1975  DOA: 10/16/2017 PCP: Patient, No Pcp Per  Admit date: 10/16/2017 Discharge date: 10/17/2017  Admitted From: Home  Disposition: Home   Recommendations for Outpatient Follow-up:  1. Follow up with PCP in 1 week  2. Please obtain BMP/CBC in one week monitor renal function and hemoglobin  Discharge Condition: Stable CODE STATUS: Full code Diet recommendation: Heart Healthy / Carb Modified   Brief/Interim Summary: For full details see H&P/Progress note, but in brief, Connie Vaughn is a 42 year old female with medical history significant for type 1 diabetes mellitus and PTSD who presented to the emergency department complaining of nausea and vomiting for 2 days prior to admission.  Patient ran out of Lantus about a week ago and was trying to manage her glucose with NovoLog only.  Patient developed polyuria and diarrhea.  Upon ED evaluation glucose was found to be 367, bicarb 9 and anion gap of 24.  PH 7.1.  Patient was admitted with working diagnosis of DKA due to noncompliant with medication.  Subjective: Patient seen and examined, she reported that she is hungry, anion gap is closed.  Denies chest pain, shortness of breath and weakness.  Discharge Diagnoses/Hospital Course:  DKA in setting of type 1 diabetes mellitus Noncompliance with medications due to financial issues. Patient was started on insulin drip, subsequently transitioned to subcu insulin with Lantus and NovoLog. A1c 11.8 down from 14 per patient.  Continue home insulin regimen. Case manager contacted provided [facility] letter so patient can get Lantus prescription. Patient tolerated diet well, CBGs were acceptable and patient has been discharged to follow-up as an outpatient.  PTSD Stable  Discharge Instructions  You were cared for by a hospitalist during your hospital stay. If you have any questions about your discharge medications or the care  you received while you were in the hospital after you are discharged, you can call the unit and asked to speak with the hospitalist on call if the hospitalist that took care of you is not available. Once you are discharged, your primary care physician will handle any further medical issues. Please note that NO REFILLS for any discharge medications will be authorized once you are discharged, as it is imperative that you return to your primary care physician (or establish a relationship with a primary care physician if you do not have one) for your aftercare needs so that they can reassess your need for medications and monitor your lab values.  Discharge Instructions    Call MD for:  difficulty breathing, headache or visual disturbances   Complete by:  As directed    Call MD for:  extreme fatigue   Complete by:  As directed    Call MD for:  hives   Complete by:  As directed    Call MD for:  persistant dizziness or light-headedness   Complete by:  As directed    Call MD for:  persistant nausea and vomiting   Complete by:  As directed    Call MD for:  redness, tenderness, or signs of infection (pain, swelling, redness, odor or green/yellow discharge around incision site)   Complete by:  As directed    Call MD for:  severe uncontrolled pain   Complete by:  As directed    Call MD for:  temperature >100.4   Complete by:  As directed    Diet - low sodium heart healthy   Complete by:  As directed  Increase activity slowly   Complete by:  As directed      Allergies as of 10/17/2017      Reactions   Sulfa Antibiotics Hives      Medication List    TAKE these medications   insulin aspart 100 UNIT/ML injection Commonly known as:  novoLOG Inject 10-12 Units into the skin 3 (three) times daily before meals.   insulin glargine 100 UNIT/ML injection Commonly known as:  LANTUS Inject 0.3 mLs (30 Units total) into the skin at bedtime.      Follow-up Information    [facility] Follow up.   Why:  Please call to make an appointment on Monday.  If unable to get an appointment, you may go on Thursday morning at 8am and wait to be seen.  You may use the pharmacy at this location ($4-10/med)if you are a patient any of these 3 clinics. Contact information: [address] [place] [phone]       [facility] Follow up.   Specialty:  Internal Medicine Why:  Please call to make an appointment at this or another listed clinic.  You may use pharmacy at [facility] if you establish care here.  Contact information: [address] [place] [id number] [phone]       [facility] Follow up.   Why:  Please call to make an appointment at this or another listed clinic.  You may use pharmacy at [facility] if you establish care here.  Appointments may be more available at this clinic as they only take certain zip codes- yours is included. Contact information: [place] [place] [phone]         Allergies  Allergen Reactions  . Sulfa Antibiotics Hives    Consultations:  None    Procedures/Studies: No results found.    Discharge Exam: Vitals:   10/17/17 0909 10/17/17 1400  BP: 97/66 128/75  Pulse:    Resp: 13 15  Temp:    SpO2:     Vitals:   10/17/17 0800 10/17/17 0908 10/17/17 0909 10/17/17 1400  BP:   97/66 128/75  Pulse:  91    Resp:  13 13 15   Temp: 98.2 F (36.8 C)     TempSrc: Oral     SpO2:  98%    Weight:      Height:        General: Pt is alert, awake, not in acute distress Cardiovascular: RRR, S1/S2 +, no rubs, no gallops Respiratory: CTA bilaterally, no wheezing, no rhonchi Abdominal: Soft, NT, ND, bowel sounds + Extremities: no edema, no cyanosis   The results of significant diagnostics from this hospitalization (including  imaging, microbiology, ancillary and laboratory) are listed below for reference.     Microbiology: Recent Results (from the past 240 hour(s))  MRSA PCR Screening     Status: None   Collection Time: 10/16/17  5:38 PM  Result Value Ref Range Status   MRSA by PCR NEGATIVE NEGATIVE Final    Comment:        The GeneXpert MRSA Assay (FDA approved for NASAL specimens only), is one component of a comprehensive MRSA colonization surveillance program. It is not intended to diagnose MRSA infection nor to guide or monitor treatment for MRSA infections. Performed at [facility], [address] [place], [place] [id number]  Labs: BNP (last 3 results) No results for input(s): BNP in the last 8760 hours. Basic Metabolic Panel: Recent Labs  Lab 10/16/17 1305 10/16/17 1312 10/16/17 1847 10/16/17 2235 10/17/17 0200 10/17/17 0600  NA 141 140 139 138 138 138  K 4.5 4.3 4.2 4.0 3.7 3.7  CL 108 109 111 110 110 109  CO2 9*  --  18* 18* 19* 20*  GLUCOSE 378* 367* 134* 151* 151* 132*  BUN 25* 21* 16 19 18 14   CREATININE 1.18* 0.60 0.63 0.65 0.57 0.53  CALCIUM 8.6*  --  8.3* 8.3* 8.3* 8.3*  MG  --   --  1.7  --  1.8  --   PHOS  --   --  2.8  --   --   --    Liver Function Tests: No results for input(s): AST, ALT, ALKPHOS, BILITOT, PROT, ALBUMIN in the last 168 hours. No results for input(s): LIPASE, AMYLASE in the last 168 hours. No results for input(s): AMMONIA in the last 168 hours. CBC: Recent Labs  Lab 10/16/17 1305 10/16/17 1312  WBC 6.4  --   HGB 12.9 13.6  HCT 38.8 40.0  MCV 93.0  --   PLT 313  --    Cardiac Enzymes: No results for input(s): CKTOTAL, CKMB, CKMBINDEX, TROPONINI in the last 168 hours. BNP: Invalid input(s): POCBNP CBG: Recent Labs  Lab 10/17/17 0506 10/17/17 0609 10/17/17 0754 10/17/17 1302 10/17/17 1609  GLUCAP 114* 152* 152* 246* 110*   D-Dimer No results for input(s): DDIMER in the last 72 hours. Hgb A1c Recent Labs     10/16/17 1847  HGBA1C 11.8*   Lipid Profile No results for input(s): CHOL, HDL, LDLCALC, TRIG, CHOLHDL, LDLDIRECT in the last 72 hours. Thyroid function studies No results for input(s): TSH, T4TOTAL, T3FREE, THYROIDAB in the last 72 hours.  Invalid input(s): FREET3 Anemia work up No results for input(s): VITAMINB12, FOLATE, FERRITIN, TIBC, IRON, RETICCTPCT in the last 72 hours. Urinalysis    Component Value Date/Time   COLORURINE STRAW (A) 10/16/2017 1408   APPEARANCEUR CLEAR 10/16/2017 1408   LABSPEC 1.017 10/16/2017 1408   PHURINE 5.0 10/16/2017 1408   GLUCOSEU >=500 (A) 10/16/2017 1408   HGBUR NEGATIVE 10/16/2017 1408   BILIRUBINUR NEGATIVE 10/16/2017 1408   KETONESUR 80 (A) 10/16/2017 1408   PROTEINUR NEGATIVE 10/16/2017 1408   NITRITE NEGATIVE 10/16/2017 1408   LEUKOCYTESUR NEGATIVE 10/16/2017 1408   Sepsis Labs Invalid input(s): PROCALCITONIN,  WBC,  LACTICIDVEN Microbiology Recent Results (from the past 240 hour(s))  MRSA PCR Screening     Status: None   Collection Time: 10/16/17  5:38 PM  Result Value Ref Range Status   MRSA by PCR NEGATIVE NEGATIVE Final    Comment:        The GeneXpert MRSA Assay (FDA approved for NASAL specimens only), is one component of a comprehensive MRSA colonization surveillance program. It is not intended to diagnose MRSA infection nor to guide or monitor treatment for MRSA infections. Performed at [facility], [address], [place], [place] [address]      Time coordinating discharge: 35 minutes  SIGNED:  Latrelle Dodrill, MD  [facility] 10/17/2017, 4:28 PM  Pager please text page via  [email or web address]  Note - This record has been created using [employer]. Chart creation errors have been sought, but may not always have been located. Such creation errors do not reflect on the standard of medical care.

## 2017-10-17 NOTE — Care Management (Signed)
Contacted by Dr. Edward JollySilva to ask for assistance for patient's medications and PCP.  Clinics placed on AVS for patient to call and make appointment.  Pt given [facility] letter [facility]  Letter/pharmacy list discussed with and faxed to bedside RN as this CM is covering remotely.  RN will give letter and list to patient to present when prescriptions are filled.  Also will advise patient to f/u with clinics on AVS.

## 2017-10-20 MED FILL — LANTUS 100 UNITS/ML VIAL: 100 | 28 days supply | Qty: 10 | Fill #0

## 2017-10-20 MED FILL — NovoLOG 100 UNIT/ML SOLN: 100 | 27 days supply | Qty: 10 | Fill #0

## 2017-10-20 NOTE — Progress Notes (Signed)
Received call from Pharmacy-they were [facility]-CM updated the info-now successful MATCH Program per pharamcy.

## 2017-11-18 ENCOUNTER — Encounter (INDEPENDENT_AMBULATORY_CARE_PROVIDER_SITE_OTHER): Payer: Self-pay | Admitting: Nurse Practitioner

## 2017-11-18 ENCOUNTER — Other Ambulatory Visit: Payer: Self-pay

## 2017-11-18 ENCOUNTER — Ambulatory Visit (INDEPENDENT_AMBULATORY_CARE_PROVIDER_SITE_OTHER): Payer: Self-pay | Admitting: Nurse Practitioner

## 2017-11-18 VITALS — BP 125/87 | HR 93 | Temp 98.2°F | Ht 64.0 in | Wt 134.8 lb

## 2017-11-18 DIAGNOSIS — F17200 Nicotine dependence, unspecified, uncomplicated: Secondary | ICD-10-CM

## 2017-11-18 DIAGNOSIS — E1065 Type 1 diabetes mellitus with hyperglycemia: Secondary | ICD-10-CM

## 2017-11-18 DIAGNOSIS — E1010 Type 1 diabetes mellitus with ketoacidosis without coma: Secondary | ICD-10-CM

## 2017-11-18 MED ORDER — INSULIN PEN NEEDLE 31G X 5 MM MISC: 2 refills | Status: AC

## 2017-11-18 MED ORDER — GLUCOSE BLOOD VI STRP: ORAL_STRIP | 12 refills | Status: AC

## 2017-11-18 MED ORDER — INSULIN GLARGINE 100 UNIT/ML SOLOSTAR PEN: 20.0000 [IU] | PEN_INJECTOR | Freq: Every day | SUBCUTANEOUS | 11 refills | Status: DC

## 2017-11-18 MED ORDER — INSULIN ASPART 100 UNIT/ML FLEXPEN: 10.0000 [IU] | PEN_INJECTOR | Freq: Three times a day (TID) | SUBCUTANEOUS | 11 refills | Status: DC

## 2017-11-18 MED FILL — TRUE METRIX TEST STRIP: 25 days supply | Qty: 100 | Fill #0

## 2017-11-18 MED FILL — !LANTUS SOLOSTAR 100UNITS/M: 100 | 30 days supply | Qty: 6 | Fill #0

## 2017-11-18 MED FILL — !NOVOLOG FLEXPEN SYRINGE 1: 100/ML | 33 days supply | Qty: 12 | Fill #0

## 2017-11-18 NOTE — Progress Notes (Signed)
Assessment & Plan:  Connie Vaughn was seen today for new patient (initial visit).  Diagnoses and all orders for this visit:  Type 1 diabetes mellitus with hyperglycemia (HCC) -     Insulin Glargine (LANTUS) 100 UNIT/ML Solostar Pen; Inject 20 Units into the skin daily at 10 pm. -     insulin aspart (NOVOLOG) 100 UNIT/ML FlexPen; Inject 10-12 Units into the skin 3 (three) times daily with meals. -     Insulin Pen Needle (B-D UF III MINI PEN NEEDLES) 31G X 5 MM MISC; Use as instructed -     glucose blood test strip; Use as instructed -     Microalbumin/Creatinine Ratio, Urine -     Lipid panel -     TSH Diabetes is poorly controlled. Advised patient to keep a fasting blood sugar log fast, 2 hours post lunch and bedtime which will be reviewed at the next office visit. Continue blood sugar control as discussed in office today, low carbohydrate diet, and regular physical exercise as tolerated, 150 minutes per week (30 min each day, 5 days per week, or 50 min 3 days per week). Keep blood sugar logs with fasting goal of 80-130 mg/dl, post prandial less than 180.  For Hypoglycemia: BS <60 and Hyperglycemia BS >400; contact the clinic ASAP. Annual eye exams and foot exams are recommended.  Tobacco Dependence [name] was counseled on the dangers of tobacco use, and was advised to quit. Reviewed strategies to maximize success, including removing cigarettes and smoking materials from environment, stress management and support of family/friends as well as pharmacological alternatives including: Wellbutrin, Chantix, Nicotine patch, Nicotine gum or lozenges. Smoking cessation support: smoking cessation hotline: 1-800-QUIT-NOW.  Smoking cessation classes are also available through [facility]. Call [phone] or visit our website at [email or web address].   A total of 3 minutes was spent on counseling for smoking cessation and [name] is not ready to quit.   Patient has been counseled on  age-appropriate routine health concerns for screening and prevention. These are reviewed and up-to-date. Referrals have been placed accordingly. Immunizations are up-to-date or declined.   She was referred to the community breast clinic today for mammogram. Subjective:   Chief Complaint  Patient presents with  . New Patient (Initial Visit)    DM   HPI Connie Vaughn 42 y.o. female presents to office today to establish care as a new patient.  She has a history of diabetes mellitus type 1 and PTSD.   Diabetes Mellitus Type 1 She was admitted to the hospital on 10/16/2017 with complaints of nausea and vomiting.  Apparently she had ran out of her Lantus approximately 1 week prior to the hospital admission and had been taking NovoLog only.  She was diagnosed with DKA and medication noncompliance.  She was transitioned from IV insulin to subcutaneous insulin and discharged on 10-17-2017.   Upon alcohol hospital admission she had been a patient at the [facility] of [place] where she was being treated for diabetes and incontinence of feces.  She endorses an upcoming appointment with her gastroenterologist for evaluation of her incontinence.  He moved to [place] last month and is noninsured.  She has been instructed to follow-up with the financial counselor for possible financial assistance.  She reports being denied for Medicaid due to her husband's income.  Her diabetes is chronic in nature and she was diagnosed at the age of 43. She does not check her blood sugars daily and tries to  to count her carbs however she also endorses frequent stress eating of unhealthy foods that tend to raise her blood sugars. Last eye exam 2019. Denies any retinopathy.  Currently taking Lantus 20 units at night and NovoLog 10 to 12 units 3 times daily.  She endorses medication compliance however again to note she is not checking her blood sugars.  She denies any history of hypertension or hyperlipidemia.  Lab Results   Component Value Date   HGBA1C 11.8 (H) 10/16/2017   Tobacco dependence She states that she smokes clove cigarettes.  The cigarettes containing tobacco and cloves and she reports she does not like the smell of tobacco.  She is aware that smoking cigarettes can also interfere with circulation as well as  poorly controlled diabetes and increases her risk for heart attack or stroke.   Review of Systems  Constitutional: Negative for fever, malaise/fatigue and weight loss.  HENT: Negative.  Negative for nosebleeds.   Eyes: Negative.  Negative for blurred vision, double vision and photophobia.  Respiratory: Negative.  Negative for cough and shortness of breath.   Cardiovascular: Negative.  Negative for chest pain, palpitations and leg swelling.  Gastrointestinal: Negative.  Negative for heartburn, nausea and vomiting.       Incontinence; lactose intolerance  Musculoskeletal: Negative.  Negative for myalgias.  Neurological: Negative.  Negative for dizziness, focal weakness, seizures and headaches.  Psychiatric/Behavioral: Negative for suicidal ideas. The patient is nervous/anxious.        PTSD    Past Medical History:  Diagnosis Date  . Diabetes mellitus type 1, uncontrolled (HCC)   . Incontinence, feces   . PTSD (post-traumatic stress disorder)     Past Surgical History:  Procedure Laterality Date  . TUBAL LIGATION      Family History  Problem Relation Age of Onset  . Heart disease Mother   . Congestive Heart Failure Mother   . Congestive Heart Failure Sister   . Diabetes Maternal Grandmother    Social History Reviewed with no changes to be made today.   Outpatient Medications Prior to Visit  Medication Sig Dispense Refill  . Lancets MISC by Does not apply route.    . insulin aspart (NOVOLOG) 100 UNIT/ML injection Inject 10-12 Units into the skin 3 (three) times daily before meals. 10 mL 1  . insulin glargine (LANTUS) 100 UNIT/ML injection Inject 0.3 mLs (30 Units total) into  the skin at bedtime. 10 mL 1   No facility-administered medications prior to visit.     Allergies  Allergen Reactions  . Sulfa Antibiotics Hives       Objective:    BP 125/87 (BP Location: Left Arm, Patient Position: Sitting, Cuff Size: Normal)   Pulse 93   Temp 98.2 F (36.8 C) (Oral)   Ht  (1.626 m)   Wt 134 lb 12.8 oz (61.1 kg)   LMP 10/20/2017 (Approximate)   SpO2 98%   BMI 23.14 kg/m  Wt Readings from Last 3 Encounters:  11/18/17 134 lb 12.8 oz (61.1 kg)  10/16/17 135 lb (61.2 kg)    Physical Exam  Constitutional: She is oriented to person, place, and time. She appears well-developed and well-nourished. She is cooperative.  HENT:  Head: Normocephalic and atraumatic.  Eyes: EOM are normal.  Neck: Normal range of motion.  Cardiovascular: Normal rate, regular rhythm and normal heart sounds. Exam reveals no gallop and no friction rub.  No murmur heard. Pulmonary/Chest: Effort normal and breath sounds normal. No tachypnea. No respiratory distress.  She has no decreased breath sounds. She has no wheezes. She has no rhonchi. She has no rales. She exhibits no tenderness.  Abdominal: Bowel sounds are normal.  Musculoskeletal: Normal range of motion. She exhibits no edema.  Neurological: She is alert and oriented to person, place, and time. Coordination normal.  Skin: Skin is warm and dry.  Psychiatric: She has a normal mood and affect. Her behavior is normal. Judgment and thought content normal.  Nursing note and vitals reviewed.     Patient has been counseled extensively about nutrition and exercise as well as the importance of adherence with medications and regular follow-up. The patient was given clear instructions to go to ER or return to medical center if symptoms don't improve, worsen or new problems develop. The patient verbalized understanding.   Follow-up: Return in about 2 months (around 01/18/2018) for DM.   [name], FNP-BC [facility] [place], [place] [phone]   11/18/2017, 2:15 PM

## 2017-11-18 NOTE — Patient Instructions (Signed)
Stress and Stress Management Stress is a normal reaction to life events. It is what you feel when life demands more than you are used to or more than you can handle. Some stress can be useful. For example, the stress reaction can help you catch the last bus of the day, study for a test, or meet a deadline at work. But stress that occurs too often or for too long can cause problems. It can affect your emotional health and interfere with relationships and normal daily activities. Too much stress can weaken your immune system and increase your risk for physical illness. If you already have a medical problem, stress can make it worse. What are the causes? All sorts of life events may cause stress. An event that causes stress for one person may not be stressful for another person. Major life events commonly cause stress. These may be positive or negative. Examples include losing your job, moving into a new home, getting married, having a baby, or losing a loved one. Less obvious life events may also cause stress, especially if they occur day after day or in combination. Examples include working long hours, driving in traffic, caring for children, being in debt, or being in a difficult relationship. What are the signs or symptoms? Stress may cause emotional symptoms including, the following:  Anxiety. This is feeling worried, afraid, on edge, overwhelmed, or out of control.  Anger. This is feeling irritated or impatient.  Depression. This is feeling sad, down, helpless, or guilty.  Difficulty focusing, remembering, or making decisions.  Stress may cause physical symptoms, including the following:  Aches and pains. These may affect your head, neck, back, stomach, or other areas of your body.  Tight muscles or clenched jaw.  Low energy or trouble sleeping.  Stress may cause unhealthy behaviors, including the following:  Eating to feel better (overeating) or skipping meals.  Sleeping too little,  too much, or both.  Working too much or putting off tasks (procrastination).  Smoking, drinking alcohol, or using drugs to feel better.  How is this diagnosed? Stress is diagnosed through an assessment by your health care provider. Your health care provider will ask questions about your symptoms and any stressful life events.Your health care provider will also ask about your medical history and may order blood tests or other tests. Certain medical conditions and medicine can cause physical symptoms similar to stress. Mental illness can cause emotional symptoms and unhealthy behaviors similar to stress. Your health care provider may refer you to a mental health professional for further evaluation. How is this treated? Stress management is the recommended treatment for stress.The goals of stress management are reducing stressful life events and coping with stress in healthy ways. Techniques for reducing stressful life events include the following:  Stress identification. Self-monitor for stress and identify what causes stress for you. These skills may help you to avoid some stressful events.  Time management. Set your priorities, keep a calendar of events, and learn to say "no." These tools can help you avoid making too many commitments.  Techniques for coping with stress include the following:  Rethinking the problem. Try to think realistically about stressful events rather than ignoring them or overreacting. Try to find the positives in a stressful situation rather than focusing on the negatives.  Exercise. Physical exercise can release both physical and emotional tension. The key is to find a form of exercise you enjoy and do it regularly.  Relaxation techniques. These relax the body and  mind. Examples include yoga, meditation, tai chi, biofeedback, deep breathing, progressive muscle relaxation, listening to music, being out in nature, journaling, and other hobbies. Again, the key is to find  one or more that you enjoy and can do regularly.  Healthy lifestyle. Eat a balanced diet, get plenty of sleep, and do not smoke. Avoid using alcohol or drugs to relax.  Strong support network. Spend time with family, friends, or other people you enjoy being around.Express your feelings and talk things over with someone you trust.  Counseling or talktherapy with a mental health professional may be helpful if you are having difficulty managing stress on your own. Medicine is typically not recommended for the treatment of stress.Talk to your health care provider if you think you need medicine for symptoms of stress. Follow these instructions at home:  Keep all follow-up visits as directed by your health care provider.  Take all medicines as directed by your health care provider. Contact a health care provider if:  Your symptoms get worse or you start having new symptoms.  You feel overwhelmed by your problems and can no longer manage them on your own. Get help right away if:  You feel like hurting yourself or someone else. This information is not intended to replace advice given to you by your health care provider. Make sure you discuss any questions you have with your health care provider. Document Released: 12/10/2000 Document Revised: 11/22/2015 Document Reviewed: 02/08/2013 Elsevier Interactive Patient Education  2017 [employer]vier Inc.  

## 2017-11-19 LAB — MICROALBUMIN / CREATININE URINE RATIO
Creatinine, Urine: 70.2 mg/dL
MICROALB/CREAT RATIO: 7.3 mg/g{creat} (ref 0.0–30.0)
Microalbumin, Urine: 5.1 ug/mL

## 2017-11-19 LAB — LIPID PANEL
CHOL/HDL RATIO: 3.1 ratio (ref 0.0–4.4)
Cholesterol, Total: 197 mg/dL (ref 100–199)
HDL: 63 mg/dL (ref >39)
LDL CALC: 113 mg/dL — AB (ref 0–99)
Triglycerides: 104 mg/dL (ref 0–149)
VLDL CHOLESTEROL CAL: 21 mg/dL (ref 5–40)

## 2017-11-19 LAB — TSH: TSH: 2.14 u[IU]/mL (ref 0.450–4.500)

## 2017-11-27 ENCOUNTER — Ambulatory Visit: Payer: Self-pay

## 2017-11-27 ENCOUNTER — Telehealth (INDEPENDENT_AMBULATORY_CARE_PROVIDER_SITE_OTHER): Payer: Self-pay

## 2017-11-27 NOTE — Telephone Encounter (Signed)
Patient voicemail not setup yet will call back. Connie Vaughn, CMA

## 2017-11-27 NOTE — Telephone Encounter (Signed)
-----   Message from [name], NP sent at 11/23/2017  6:51 PM EDT ----- Thyroid level is normal. Kidneys are not being affected by diabetes at this time. Tests show increased LDL/lipid levels.  Patient should work on a low fat, heart healthy diet and participate in regular aerobic exercise program to control as well by working out at least 150 minutes per week. Avoid red meat/beef, fried foods. No junk foods, sodas, sugary drinks, unhealthy snacking, or smoking.

## 2017-12-02 ENCOUNTER — Telehealth (INDEPENDENT_AMBULATORY_CARE_PROVIDER_SITE_OTHER): Payer: Self-pay

## 2017-12-02 NOTE — Telephone Encounter (Signed)
-----   Message from Claiborne RiggZelda W Fleming, NP sent at 11/23/2017  6:51 PM EDT ----- Thyroid level is normal. Kidneys are not being affected by diabetes at this time. Tests show increased LDL/lipid levels.  Patient should work on a low fat, heart healthy diet and participate in regular aerobic exercise program to control as well by working out at least 150 minutes per week. Avoid red meat/beef, fried foods. No junk foods, sodas, sugary drinks, unhealthy snacking, or smoking.

## 2017-12-02 NOTE — Telephone Encounter (Signed)
Patient is aware of normal thyroid levels, increased LDL/lipids but no medication at this time. Patient is aware that her kidneys are not being affected by her diabetes at this time. Advised to work on a low fat heart healthy diet and participate in regular aerobic exercise program to control by working out at least 150 minutes a week. Avoid red meat/beef and fried foods. No junk foods, sodas, sugary drinks, unhealthy snacking or smoking. Patient expressed understanding. Connie Vaughn, CMA

## 2017-12-04 ENCOUNTER — Telehealth (INDEPENDENT_AMBULATORY_CARE_PROVIDER_SITE_OTHER): Payer: Self-pay | Admitting: Physician Assistant

## 2017-12-04 NOTE — Telephone Encounter (Signed)
Pt called to request a medication change on -Insulin Glargine (LANTUS) 100 UNIT/ML Solostar Pen  She states she has always taken 32 units,[facility]

## 2017-12-05 NOTE — Telephone Encounter (Signed)
Please make an appointment or go to urgent care until she can be seen here.

## 2017-12-07 NOTE — Telephone Encounter (Signed)
Attempted to contact patient several times to schedule something sooner. Unable reach her no VM was set up

## 2017-12-18 MED FILL — !LANTUS SOLOSTAR 100UNITS/M: 100 | 30 days supply | Qty: 6 | Fill #1

## 2017-12-18 MED FILL — $novoLOG FLEXPEN SYRINGE: 100 | 33 days supply | Qty: 12 | Fill #1

## 2018-01-11 ENCOUNTER — Ambulatory Visit: Payer: Self-pay | Attending: Physician Assistant

## 2018-01-12 ENCOUNTER — Telehealth: Payer: Self-pay

## 2018-01-12 NOTE — Telephone Encounter (Signed)
At request of Jenene Slickerarlos Mosquera, [facility] Financial Counselor, application for legal aid assistance faxed to Ranee GosselinMaria Ramirez Perez, [employer] [facility] [place]

## 2018-01-15 MED FILL — $novoLOG FLEXPEN SYRINGE: 100 | 33 days supply | Qty: 12 | Fill #2

## 2018-01-15 MED FILL — $LANTUS SOLOSTAR 100 UNITS/: 100 | 30 days supply | Qty: 6 | Fill #2

## 2018-01-19 ENCOUNTER — Ambulatory Visit (INDEPENDENT_AMBULATORY_CARE_PROVIDER_SITE_OTHER): Payer: Self-pay | Admitting: Physician Assistant

## 2018-01-19 MED FILL — TRUEPLUS PEN NDL 31GX3/16": 31G X 5 MM | 25 days supply | Qty: 100 | Fill #0

## 2018-01-19 MED FILL — TRUEPLUS PEN NDL 31GX3/16: 31G X 5 MM | 25 days supply | Qty: 100 | Fill #0

## 2018-01-28 ENCOUNTER — Ambulatory Visit (INDEPENDENT_AMBULATORY_CARE_PROVIDER_SITE_OTHER): Payer: Self-pay | Admitting: Physician Assistant

## 2018-02-25 MED FILL — $novoLOG FLEXPEN SYRINGE: 100 | 33 days supply | Qty: 12 | Fill #3

## 2018-02-25 MED FILL — $LANTUS SOLOSTAR 100 UNITS/: 100 | 30 days supply | Qty: 6 | Fill #3

## 2018-03-24 ENCOUNTER — Telehealth: Payer: Self-pay

## 2018-03-24 MED FILL — $novoLOG FLEXPEN SYRINGE: 100 | 33 days supply | Qty: 12 | Fill #4

## 2018-03-24 MED FILL — $LANTUS SOLOSTAR 100 UNITS/: 100 | 90 days supply | Qty: 18 | Fill #4

## 2018-03-24 NOTE — Telephone Encounter (Signed)
As per Ranee GosselinMaria Ramirez Perez, [employer] [place], they have offered the patient advice about applying for disability and have closed the case

## 2018-04-23 MED FILL — $novoLOG FLEXPEN SYRINGE: 100 | 99 days supply | Qty: 36 | Fill #5

## 2018-07-16 MED FILL — $LANTUS SOLOSTAR 100 UNITS/: 100 | 36 days supply | Qty: 9 | Fill #0

## 2018-07-26 MED FILL — $novoLOG FLEXPEN SYRINGE: 100 | 50 days supply | Qty: 18 | Fill #6

## 2018-08-18 DIAGNOSIS — E1042 Type 1 diabetes mellitus with diabetic polyneuropathy: Secondary | ICD-10-CM | POA: Insufficient documentation

## 2018-08-23 MED FILL — $LANTUS SOLOSTAR 100 UNITS/: 100 | 60 days supply | Qty: 15 | Fill #1

## 2018-10-19 MED FILL — $novoLOG FLEXPEN SYRINGE: 100 | 50 days supply | Qty: 18 | Fill #7

## 2018-10-19 MED FILL — $LANTUS SOLOSTAR 100 UNITS/: 100 | 60 days supply | Qty: 15 | Fill #2

## 2018-10-19 MED FILL — TRUE METRIX TEST STRIP: 25 days supply | Qty: 100 | Fill #1

## 2019-01-10 ENCOUNTER — Other Ambulatory Visit: Payer: Self-pay

## 2019-01-10 ENCOUNTER — Encounter (INDEPENDENT_AMBULATORY_CARE_PROVIDER_SITE_OTHER): Payer: Managed Care, Other (non HMO) | Admitting: Ophthalmology

## 2019-01-10 DIAGNOSIS — E10311 Type 1 diabetes mellitus with unspecified diabetic retinopathy with macular edema: Secondary | ICD-10-CM | POA: Diagnosis not present

## 2019-01-10 DIAGNOSIS — H43813 Vitreous degeneration, bilateral: Secondary | ICD-10-CM | POA: Diagnosis not present

## 2019-01-10 DIAGNOSIS — E103313 Type 1 diabetes mellitus with moderate nonproliferative diabetic retinopathy with macular edema, bilateral: Secondary | ICD-10-CM | POA: Diagnosis not present

## 2019-01-10 DIAGNOSIS — H2513 Age-related nuclear cataract, bilateral: Secondary | ICD-10-CM

## 2019-01-28 ENCOUNTER — Other Ambulatory Visit: Payer: Self-pay

## 2019-01-28 ENCOUNTER — Ambulatory Visit (INDEPENDENT_AMBULATORY_CARE_PROVIDER_SITE_OTHER): Payer: 59 | Admitting: Mental Health

## 2019-01-28 DIAGNOSIS — F331 Major depressive disorder, recurrent, moderate: Secondary | ICD-10-CM
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
## 2019-02-09 ENCOUNTER — Ambulatory Visit (INDEPENDENT_AMBULATORY_CARE_PROVIDER_SITE_OTHER): Payer: 59 | Admitting: Mental Health

## 2019-02-09 ENCOUNTER — Other Ambulatory Visit: Payer: Self-pay

## 2019-02-09 DIAGNOSIS — F331 Major depressive disorder, recurrent, moderate: Secondary | ICD-10-CM
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
## 2019-02-10 MED FILL — TRUEPLUS 5-BEVEL PEN NEEDLE: 31G X 5 MM | 25 days supply | Qty: 100 | Fill #0

## 2019-02-10 MED FILL — BASAGLAR 100 UNIT/ML KWIKPE: 100 | 30 days supply | Qty: 9 | Fill #0

## 2019-03-08 ENCOUNTER — Ambulatory Visit (INDEPENDENT_AMBULATORY_CARE_PROVIDER_SITE_OTHER): Payer: Self-pay | Admitting: Mental Health

## 2019-03-08 ENCOUNTER — Other Ambulatory Visit: Payer: Self-pay

## 2019-03-08 DIAGNOSIS — F331 Major depressive disorder, recurrent, moderate: Secondary | ICD-10-CM

## 2019-03-08 NOTE — Progress Notes (Signed)
[facility] Psychotherapy  Name: Connie Vaughn Date: 03/08/2019 MRN: [id number] DOB: [birth year] PCP: Patient, No Pcp Per  Time spent:  55 minutes   Mental Status Exam:   Appearance:   Casual     Behavior:  Appropriate  Motor:  Normal  Speech/L anguage:   Clear and Coherent  Affect:  Appropriate  Mood:  depressed  Thought process:  normal  Thought content:    WNL  Sensory/Perceptual disturbances:    WNL  Orientation:  oriented to person, place and time/date  Attention:  Good  Concentration:  Good  Memory:  WNL  Fund of knowledge:   Good  Insight:    Good  Judgment:   Good  Impulse Control:  Good   Reported Symptoms:   Depressed, anxious, irritability, low motivation at times, negative self talk, crying spells, anhedonia  Risk Assessment: Danger to Self:  No Self-injurious Behavior: No Danger to Others: No Duty to Warn:no Physical Aggression / Violence:No  Access to Firearms a concern: No  Gang Involvement:No  Patient / guardian was educated about steps to take if suicide or homicide risk level increases between visits: yes While future psychiatric events cannot be accurately predicted, the patient does not currently require acute inpatient psychiatric care and does not currently meet [facility] involuntary commitment criteria.  Substance Abuse History: Current substance abuse: No     Subjective: Patient arrived on time for today's session.  Discussed progress, recent events.  Patient shared how she was depressed last week reporting to have less motivation and was more irritable.  She continues to adjust to her son moving out.  She shared how she was able to spend time with her husband over the weekend as they were able to go fishing on [address].  Patient shared how this is highly relaxing and enjoyable.  She shared her passion for this and other things such as cooking.  Ways she can continue to identify and express her needs with her husband was shared.   Patient wants to continue this to keep her stress lower.  Provide support as she processed feelings related to her barriers with trust with others due to her past family and other life experiences.  Interventions: CBT, supportive therapy  Diagnoses:    ICD-10-CM   1. Major depressive disorder, recurrent episode, moderate (HCC)  F33.1     Plan of Care:  Complete part II of the assessment.   Anson [place], [facility]

## 2019-03-23 ENCOUNTER — Ambulatory Visit: Payer: 59 | Admitting: Mental Health

## 2019-04-06 ENCOUNTER — Ambulatory Visit: Payer: 59 | Admitting: Mental Health

## 2019-05-16 ENCOUNTER — Encounter (INDEPENDENT_AMBULATORY_CARE_PROVIDER_SITE_OTHER): Payer: Managed Care, Other (non HMO) | Admitting: Ophthalmology

## 2019-05-17 ENCOUNTER — Ambulatory Visit: Payer: 59 | Admitting: Mental Health

## 2019-05-24 ENCOUNTER — Other Ambulatory Visit: Payer: Self-pay

## 2019-05-24 ENCOUNTER — Encounter (INDEPENDENT_AMBULATORY_CARE_PROVIDER_SITE_OTHER): Payer: Managed Care, Other (non HMO) | Admitting: Ophthalmology

## 2019-05-24 DIAGNOSIS — H43813 Vitreous degeneration, bilateral: Secondary | ICD-10-CM

## 2019-05-24 DIAGNOSIS — E11311 Type 2 diabetes mellitus with unspecified diabetic retinopathy with macular edema: Secondary | ICD-10-CM

## 2019-05-24 DIAGNOSIS — H2511 Age-related nuclear cataract, right eye: Secondary | ICD-10-CM

## 2019-05-24 DIAGNOSIS — E113311 Type 2 diabetes mellitus with moderate nonproliferative diabetic retinopathy with macular edema, right eye: Secondary | ICD-10-CM

## 2019-05-24 DIAGNOSIS — E113392 Type 2 diabetes mellitus with moderate nonproliferative diabetic retinopathy without macular edema, left eye: Secondary | ICD-10-CM | POA: Diagnosis not present

## 2019-11-21 ENCOUNTER — Encounter (INDEPENDENT_AMBULATORY_CARE_PROVIDER_SITE_OTHER): Payer: Managed Care, Other (non HMO) | Admitting: Ophthalmology

## 2019-12-07 ENCOUNTER — Other Ambulatory Visit: Payer: Self-pay

## 2019-12-07 ENCOUNTER — Encounter (INDEPENDENT_AMBULATORY_CARE_PROVIDER_SITE_OTHER): Payer: Managed Care, Other (non HMO) | Admitting: Ophthalmology

## 2019-12-07 DIAGNOSIS — H43813 Vitreous degeneration, bilateral: Secondary | ICD-10-CM | POA: Diagnosis not present

## 2019-12-07 DIAGNOSIS — E11311 Type 2 diabetes mellitus with unspecified diabetic retinopathy with macular edema: Secondary | ICD-10-CM

## 2019-12-07 DIAGNOSIS — E113313 Type 2 diabetes mellitus with moderate nonproliferative diabetic retinopathy with macular edema, bilateral: Secondary | ICD-10-CM | POA: Diagnosis not present

## 2020-06-19 ENCOUNTER — Encounter (INDEPENDENT_AMBULATORY_CARE_PROVIDER_SITE_OTHER): Payer: Managed Care, Other (non HMO) | Admitting: Ophthalmology

## 2020-10-06 ENCOUNTER — Other Ambulatory Visit: Payer: Self-pay

## 2020-10-06 ENCOUNTER — Emergency Department (HOSPITAL_COMMUNITY): Payer: Managed Care, Other (non HMO)

## 2020-10-06 ENCOUNTER — Encounter (HOSPITAL_COMMUNITY): Payer: Self-pay

## 2020-10-06 ENCOUNTER — Inpatient Hospital Stay (HOSPITAL_COMMUNITY)
Admission: EM | Admit: 2020-10-06 | Discharge: 2020-10-07 | DRG: 638 | Discharge status: Against Medical Advice | Payer: Managed Care, Other (non HMO) | Attending: Internal Medicine | Admitting: Internal Medicine

## 2020-10-06 DIAGNOSIS — Z20822 Contact with and (suspected) exposure to covid-19: Secondary | ICD-10-CM | POA: Diagnosis present

## 2020-10-06 DIAGNOSIS — F4310 Post-traumatic stress disorder, unspecified: Secondary | ICD-10-CM | POA: Diagnosis present

## 2020-10-06 DIAGNOSIS — D649 Anemia, unspecified: Secondary | ICD-10-CM | POA: Diagnosis present

## 2020-10-06 DIAGNOSIS — E1010 Type 1 diabetes mellitus with ketoacidosis without coma: Principal | ICD-10-CM | POA: Diagnosis present

## 2020-10-06 DIAGNOSIS — Z833 Family history of diabetes mellitus: Secondary | ICD-10-CM

## 2020-10-06 DIAGNOSIS — Z882 Allergy status to sulfonamides status: Secondary | ICD-10-CM

## 2020-10-06 DIAGNOSIS — F17210 Nicotine dependence, cigarettes, uncomplicated: Secondary | ICD-10-CM | POA: Diagnosis present

## 2020-10-06 DIAGNOSIS — Z9851 Tubal ligation status: Secondary | ICD-10-CM

## 2020-10-06 DIAGNOSIS — N179 Acute kidney failure, unspecified: Secondary | ICD-10-CM

## 2020-10-06 DIAGNOSIS — Z8249 Family history of ischemic heart disease and other diseases of the circulatory system: Secondary | ICD-10-CM | POA: Diagnosis not present

## 2020-10-06 DIAGNOSIS — R52 Pain, unspecified: Secondary | ICD-10-CM | POA: Diagnosis present

## 2020-10-06 DIAGNOSIS — E785 Hyperlipidemia, unspecified: Secondary | ICD-10-CM | POA: Diagnosis present

## 2020-10-06 DIAGNOSIS — F1290 Cannabis use, unspecified, uncomplicated: Secondary | ICD-10-CM | POA: Diagnosis present

## 2020-10-06 DIAGNOSIS — Z5329 Procedure and treatment not carried out because of patient's decision for other reasons: Secondary | ICD-10-CM | POA: Diagnosis not present

## 2020-10-06 DIAGNOSIS — R32 Unspecified urinary incontinence: Secondary | ICD-10-CM | POA: Diagnosis present

## 2020-10-06 DIAGNOSIS — I959 Hypotension, unspecified: Secondary | ICD-10-CM | POA: Diagnosis present

## 2020-10-06 DIAGNOSIS — J069 Acute upper respiratory infection, unspecified: Secondary | ICD-10-CM | POA: Diagnosis present

## 2020-10-06 DIAGNOSIS — Z794 Long term (current) use of insulin: Secondary | ICD-10-CM

## 2020-10-06 DIAGNOSIS — E861 Hypovolemia: Secondary | ICD-10-CM | POA: Diagnosis present

## 2020-10-06 DIAGNOSIS — E1110 Type 2 diabetes mellitus with ketoacidosis without coma: Secondary | ICD-10-CM

## 2020-10-06 HISTORY — DX: Acute kidney failure, unspecified: N17.9

## 2020-10-06 HISTORY — DX: Type 2 diabetes mellitus with ketoacidosis without coma: E11.10

## 2020-10-06 LAB — CBC WITH DIFFERENTIAL/PLATELET
Abs Immature Granulocytes: 0.02 10*3/uL (ref 0.00–0.07)
Basophils Absolute: 0 10*3/uL (ref 0.0–0.1)
Basophils Relative: 1 %
Eosinophils Absolute: 0 10*3/uL (ref 0.0–0.5)
Eosinophils Relative: 1 %
HCT: 37.9 % (ref 36.0–46.0)
Hemoglobin: 11.9 g/dL — ABNORMAL LOW (ref 12.0–15.0)
Immature Granulocytes: 0 %
Lymphocytes Relative: 19 %
Lymphs Abs: 1 10*3/uL (ref 0.7–4.0)
MCH: 29.9 pg (ref 26.0–34.0)
MCHC: 31.4 g/dL (ref 30.0–36.0)
MCV: 95.2 fL (ref 80.0–100.0)
Monocytes Absolute: 0.4 10*3/uL (ref 0.1–1.0)
Monocytes Relative: 8 %
Neutro Abs: 3.8 10*3/uL (ref 1.7–7.7)
Neutrophils Relative %: 71 %
Platelets: 246 10*3/uL (ref 150–400)
RBC: 3.98 MIL/uL (ref 3.87–5.11)
RDW: 13.2 % (ref 11.5–15.5)
WBC: 5.3 10*3/uL (ref 4.0–10.5)
nRBC: 0 % (ref 0.0–0.2)

## 2020-10-06 LAB — CBG MONITORING, ED
Glucose-Capillary: >600 mg/dL (ref 70–99)
Glucose-Capillary: >600 mg/dL (ref 70–99)

## 2020-10-06 LAB — URINALYSIS, ROUTINE W REFLEX MICROSCOPIC
Bacteria, UA: NONE SEEN
Bilirubin Urine: NEGATIVE
Glucose, UA: >=500 mg/dL — AB
Ketones, ur: 80 mg/dL — AB
Leukocytes,Ua: NEGATIVE
Nitrite: NEGATIVE
Protein, ur: NEGATIVE mg/dL
Specific Gravity, Urine: 1.023 (ref 1.005–1.030)
pH: 5 (ref 5.0–8.0)

## 2020-10-06 LAB — BLOOD GAS, VENOUS
Acid-base deficit: 13.6 mmol/L — ABNORMAL HIGH (ref 0.0–2.0)
Bicarbonate: 12.6 mmol/L — ABNORMAL LOW (ref 20.0–28.0)
FIO2: 21
O2 Saturation: 84.8 %
Patient temperature: 98.6
pCO2, Ven: 30.7 mmHg — ABNORMAL LOW (ref 44.0–60.0)
pH, Ven: 7.236 — ABNORMAL LOW (ref 7.250–7.430)
pO2, Ven: 58 mmHg — ABNORMAL HIGH (ref 32.0–45.0)

## 2020-10-06 LAB — BASIC METABOLIC PANEL
Anion gap: 18 — ABNORMAL HIGH (ref 5–15)
BUN: 23 mg/dL — ABNORMAL HIGH (ref 6–20)
CO2: 14 mmol/L — ABNORMAL LOW (ref 22–32)
Calcium: 8.3 mg/dL — ABNORMAL LOW (ref 8.9–10.3)
Chloride: 94 mmol/L — ABNORMAL LOW (ref 98–111)
Creatinine, Ser: 1.22 mg/dL — ABNORMAL HIGH (ref 0.44–1.00)
GFR, Estimated: 56 mL/min — ABNORMAL LOW (ref >60)
Glucose, Bld: 804 mg/dL (ref 70–99)
Potassium: 5.3 mmol/L — ABNORMAL HIGH (ref 3.5–5.1)
Sodium: 126 mmol/L — ABNORMAL LOW (ref 135–145)

## 2020-10-06 LAB — GLUCOSE, CAPILLARY
Glucose-Capillary: 400 mg/dL — ABNORMAL HIGH (ref 70–99)
Glucose-Capillary: 315 mg/dL — ABNORMAL HIGH (ref 70–99)

## 2020-10-06 LAB — POC SARS CORONAVIRUS 2 AG -  ED: SARS Coronavirus 2 Ag: NEGATIVE

## 2020-10-06 LAB — MRSA PCR SCREENING: MRSA by PCR: NEGATIVE

## 2020-10-06 LAB — I-STAT BETA HCG BLOOD, ED (MC, WL, AP ONLY): I-stat hCG, quantitative: <5 m[IU]/mL (ref <5)

## 2020-10-06 MED ORDER — DEXTROSE 50 % IV SOLN: 0.0000 mL | INTRAVENOUS | Status: DC | PRN

## 2020-10-06 MED ORDER — INSULIN REGULAR(HUMAN) IN NACL 100-0.9 UT/100ML-% IV SOLN
INTRAVENOUS | Status: DC
  Administered 2020-10-06: 7 [IU]/h via INTRAVENOUS
  Filled 2020-10-06: qty 100

## 2020-10-06 MED ORDER — LACTATED RINGERS IV BOLUS
20.0000 mL/kg | Freq: Once | INTRAVENOUS | Status: AC
  Administered 2020-10-06: 1152 mL via INTRAVENOUS

## 2020-10-06 MED ORDER — CHLORHEXIDINE GLUCONATE CLOTH 2 % EX PADS
6.0000 | MEDICATED_PAD | Freq: Every day | CUTANEOUS | Status: DC
  Administered 2020-10-07: 6 via TOPICAL

## 2020-10-06 MED ORDER — LACTATED RINGERS IV SOLN: INTRAVENOUS | Status: DC

## 2020-10-06 MED ORDER — ENOXAPARIN SODIUM 40 MG/0.4ML ~~LOC~~ SOLN
40.0000 mg | SUBCUTANEOUS | Status: DC
  Administered 2020-10-06: 40 mg via SUBCUTANEOUS
  Filled 2020-10-06: qty 0.4

## 2020-10-06 MED ORDER — DEXTROSE IN LACTATED RINGERS 5 % IV SOLN: INTRAVENOUS | Status: DC

## 2020-10-06 MED ORDER — SODIUM CHLORIDE 0.9 % IV BOLUS
1000.0000 mL | Freq: Once | INTRAVENOUS | Status: AC
  Administered 2020-10-06: 1000 mL via INTRAVENOUS

## 2020-10-06 NOTE — ED Notes (Signed)
Patient noted to have asked husband to bring her a hamburger and "sneak it in under the blanket." Patient reminded of NPO status due to her sugar being in the 800s. Patient verbalized understanding.

## 2020-10-06 NOTE — ED Notes (Signed)
Attempted to start an IV to give fluids. Patient requests to wait until Covid test results come back and she can walk to the restroom. Patient educated on the end for fluids due to low blood pressure. Patient still refused and requested to wait.

## 2020-10-06 NOTE — ED Provider Notes (Signed)
[facility]-EMERGENCY DEPT Provider Note   CSN: [id number] Arrival date & time: 10/06/20  1613     History Chief Complaint  Patient presents with  . bodyaches    Generalized body aches.  . Chills    Runny nose and chills.    Connie Vaughn is a 45 y.o. female diabetes type 1.  Patient presents today with chief complaint of viral-like illness.  Reports that she developed nasal congestion, sinus pressure, generalized weakness, rhinorrhea, chills, decreased appetite, cough, shortness of breath, myalgias 10/02/2020.  Reports symptoms have worsened over this time.   Patient reports no relief of symptoms using over-the-counter medications.  She reports that her son was recently ill as well.  He was not tested for COVID-19.  Reports that she had COVID-19 vaccinations x2.  Patient has not received her booster.  She reports she is a diabetic and has been having difficulty controlling her sugars over the last 5 days.  Patient states that she took 15 units of insulin 1 hour prior to her arrival in the emergency department.  HPI     Past Medical History:  Diagnosis Date  . Diabetes mellitus type 1, uncontrolled (HCC)   . Incontinence, feces   . PTSD (post-traumatic stress disorder)     Patient Active Problem List   Diagnosis Date Noted  . DKA, type 1 (HCC) 10/16/2017  . Diabetic ketoacidosis without coma associated with type 1 diabetes mellitus [facility])     Past Surgical History:  Procedure Laterality Date  . TUBAL LIGATION       OB History   No obstetric history on file.     Family History  Problem Relation Age of Onset  . Heart disease Mother   . Congestive Heart Failure Mother   . Congestive Heart Failure Sister   . Diabetes Maternal Grandmother     Social History   Tobacco Use  . Smoking status: Current Every Day Smoker    Types: Cigarettes  . Smokeless tobacco: Never Used  . Tobacco comment: also uses Black and Mild  Vaping Use  . Vaping Use:  Never used  Substance Use Topics  . Alcohol use: Never  . Drug use: Yes    Types: Marijuana    Comment: edibles    Home Medications Prior to Admission medications   Medication Sig Start Date End Date Taking? Authorizing Provider  glucose blood test strip Use as instructed 11/18/17   [name], NP  insulin aspart (NOVOLOG) 100 UNIT/ML FlexPen Inject 10-12 Units into the skin 3 (three) times daily with meals. 11/18/17   [name], NP  Insulin Glargine (LANTUS) 100 UNIT/ML Solostar Pen Inject 20 Units into the skin daily at 10 pm. 11/18/17 12/18/17  [name], NP  Insulin Pen Needle (B-D UF III MINI PEN NEEDLES) 31G X 5 MM MISC Use as instructed 11/18/17   [name], NP  Lancets MISC by Does not apply route. 07/09/13   [provider]    Allergies    Sulfa antibiotics  Review of Systems   Review of Systems  Constitutional: Positive for appetite change and chills. Negative for fever. Diaphoresis: decreased.  HENT: Positive for congestion, rhinorrhea and sinus pressure. Negative for sore throat.   Eyes: Negative for visual disturbance.  Respiratory: Positive for cough. Negative for shortness of breath.   Cardiovascular: Negative for chest pain, palpitations and leg swelling.  Gastrointestinal: Negative for abdominal pain, constipation, diarrhea, nausea, rectal pain and vomiting.  Genitourinary: Negative for difficulty urinating, dysuria, flank pain, frequency, hematuria, vaginal bleeding, vaginal discharge and vaginal pain.  Musculoskeletal: Positive for myalgias. Negative for back pain and neck pain.  Skin: Negative for color change and rash.  Neurological: Negative for dizziness, syncope, light-headedness and headaches.  Psychiatric/Behavioral: Negative for confusion.    Physical Exam Updated Vital Signs BP 103/62 (BP Location: Left Arm)   Pulse (!) 121   Temp 97.6 F (36.4 C) (Oral)   Resp 20   Ht 5\' 4"  (1.626 m)   Wt 57.6 kg   LMP 10/02/2020  (Exact Date)   BMI 21.80 kg/m   Physical Exam Vitals and nursing note reviewed.  Constitutional:      General: She is not in acute distress.    Appearance: She is ill-appearing. She is not toxic-appearing or diaphoretic.  HENT:     Head: Normocephalic and atraumatic.  Eyes:     General: No scleral icterus.       Right eye: No discharge.        Left eye: No discharge.  Cardiovascular:     Rate and Rhythm: Normal rate.     Heart sounds: Normal heart sounds.  Pulmonary:     Effort: Pulmonary effort is normal. No tachypnea, bradypnea or respiratory distress.     Breath sounds: Normal breath sounds. No stridor.  Abdominal:     General: Abdomen is flat. There is no distension. There are no signs of injury.     Palpations: Abdomen is soft. There is no mass or pulsatile mass.     Tenderness: There is no abdominal tenderness. There is no guarding or rebound.  Musculoskeletal:     Cervical back: Neck supple.     Right lower leg: Normal. No swelling or tenderness. No edema.     Left lower leg: Normal. No swelling or tenderness. No edema.  Skin:    General: Skin is warm and dry.     Coloration: Skin is not jaundiced or pale.  Neurological:     General: No focal deficit present.     Mental Status: She is alert.  Psychiatric:        Behavior: Behavior is cooperative.     ED Results / Procedures / Treatments   Labs (all labs ordered are listed, but only abnormal results are displayed) Labs Reviewed  CBC WITH DIFFERENTIAL/PLATELET - Abnormal; Notable for the following components:      Result Value   Hemoglobin 11.9 (*)    All other components within normal limits  BASIC METABOLIC PANEL - Abnormal; Notable for the following components:   Sodium 126 (*)    Potassium 5.3 (*)    Chloride 94 (*)    CO2 14 (*)    Glucose, Bld 804 (*)    BUN 23 (*)    Creatinine, Ser 1.22 (*)    Calcium 8.3 (*)    GFR, Estimated 56 (*)    Anion gap 18 (*)    All other components within normal  limits  SARS CORONAVIRUS 2 (TAT 6-24 HRS)  BLOOD GAS, VENOUS  BETA-HYDROXYBUTYRIC ACID  URINALYSIS, ROUTINE W REFLEX MICROSCOPIC  POC SARS CORONAVIRUS 2 AG -  ED  I-STAT BETA HCG BLOOD, ED ([facility], [facility], [facility] ONLY)  CBG MONITORING, ED    EKG None  Radiology DG Chest Portable 1 View  Result Date: 10/06/2020 CLINICAL DATA:  Cough.  Myalgias.  Chills.  Rhinorrhea. EXAM: PORTABLE CHEST 1 VIEW COMPARISON:  Report dated 12/25/2015 FINDINGS: Normal sized heart.  Clear lungs with normal vascularity. Moderate dextroconvex thoracic scoliosis. IMPRESSION: No acute abnormality. Electronically Signed   By: Beckie Salts M.D.   On: 10/06/2020 17:42    Procedures .Critical Care Performed by: Haskel Schroeder, PA-C Authorized by: Haskel Schroeder, PA-C   Critical care provider statement:    Critical care time (minutes):  30   Critical care was time spent personally by me on the following activities:  Discussions with consultants, evaluation of patient's response to treatment, examination of patient, ordering and performing treatments and interventions, ordering and review of laboratory studies, ordering and review of radiographic studies, pulse oximetry, re-evaluation of patient's condition, obtaining history from patient or surrogate and review of old charts   Care discussed with: admitting provider       Medications Ordered in ED Medications  lactated ringers bolus 1,152 mL (has no administration in time range)  insulin regular, human (MYXREDLIN) 100 units/ 100 mL infusion (has no administration in time range)  lactated ringers infusion (has no administration in time range)  dextrose 5 % in lactated ringers infusion (has no administration in time range)  dextrose 50 % solution 0-50 mL (has no administration in time range)  sodium chloride 0.9 % bolus 1,000 mL (1,000 mLs Intravenous New Bag/Given 10/06/20 1922)    ED Course  I have reviewed the triage vital signs and the nursing  notes.  Pertinent labs & imaging results that were available during my care of the patient were reviewed by me and considered in my medical decision making (see chart for details).  Clinical Course as of 10/06/20 1922  Sat Oct 06, 2020  1904 Glucose(!!): 804 Informed by RN about critical report  [PB]    Clinical Course User Index [PB] Berneice Heinrich   MDM Rules/Calculators/A&P                          Alert 45 year old female no acute distress, nontoxic-appearing, ill-appearing.  Patient presents with chief complaint of flulike symptoms x5 days.  She also reports that she is having difficulty with controlling her blood sugars.  She is a type I diabetic.  We will obtain Covid test, BMP, CBC, and chest x-ray.  Chest x-ray shows no signs of active cardiopulmonary disease. CBC is unremarkable. Covid antigen test negative; will order PCR test. Advised by RN that is hypotensive at rate of 87/60.  1 L fluid bolus ordered.  Informed by RN  glucose was 804.  Potassium 5.3, bicarb decreased to 14, and anion gap of 18.  Concern for DKA.  Will initiate DKA protocol and admit patient.  2000 spoke to hospitalist Dr.Tu who will see the patient for admission.   Final Clinical Impression(s) / ED Diagnoses Final diagnoses:  Type 1 diabetes mellitus with ketoacidosis without coma [facility])    Rx / DC Orders ED Discharge Orders    None       Berneice Heinrich 10/07/20 0056    Lorre Nick, MD 10/11/20 1112

## 2020-10-06 NOTE — ED Notes (Addendum)
Patient refusing following CBGs. Patient states, "this is why I have this," referencing her CGM on the back of her right arm, "because it's painful and I don't like to do it." Explained to patient that we are unable to use her CGM due to the results not being able to enter the system, and our inability to verify if the results are accurate, in order to properly dose her insulin through endotool. Patient verbalized understanding of the system and need for finger sticks, and patient is still adamantly refusing further CBGs. Tu, DO made aware and is at the bedside speaking with the patient.

## 2020-10-06 NOTE — H&P (Signed)
History and Physical    Connie Vaughn NID:[id number] DOB: 1976-06-23 DOA: 10/06/2020  PCP: Family, [facility]  Patient coming from: Home  I have personally briefly reviewed patient's old medical records in [facility]  Chief Complaint: hyperglycemia  HPI: Connie Vaughn is a 45 y.o. female with medical history significant for type 1diabetes, PTSD hyperlipidemia who presents with concerns of hypoglycemia.  About 5 days ago patient began to have symptoms of sinus congestion, runny nose, chills without objective fever, nausea and diarrhea.  Also had significant worsening body ache today. Has shortness of breath due to body ache and pain. Son at home also has similar symptoms but did not do COVID testing. Sugars have been running in the 500s. She normally  does sliding scale with meals and at nighttime.  States she has been trying to avoid carbs.  She normally follows with endocrinology outpatient but has not seen them for at least 5 months since she switched jobs and has not had time to go.  Unsure of her recent hemoglobin A1c.  ED Course: She was afebrile but tachycardic and hypotensive down to systolic of 80s over 50s with improvement following fluids.  No leukocytosis.  Mild anemia with hemoglobin of 11.9.  Sodium of 126, glucose of 804, anion gap of 18, CO2 of 14.  K of 5.3.  Creatinine of 1.22. VBG and beta hydroxybutyrate pending at time of admission.  UA negative.  Chest x-ray negative.  Review of Systems: Constitutional: No Weight Change, No Fever ENT/Mouth: No sore throat, No Rhinorrhea Eyes: No Eye Pain, No Vision Changes Cardiovascular: No Chest Pain, + SOB Respiratory: + Cough, No Sputum, No Wheezing, no Dyspnea  Gastrointestinal: + Nausea, No Vomiting, + Diarrhea, No Constipation, No Pain Genitourinary: no Urinary Incontinence Musculoskeletal: No Arthralgias, No Myalgias Skin: No Skin Lesions, No Pruritus Neuro: no Weakness, No Numbness Psych: No  Anxiety/Panic, No Depression, no decrease appetite Heme/Lymph: No Bruising, No Bleeding  Past Medical History:  Diagnosis Date  . Diabetes mellitus type 1, uncontrolled (HCC)   . Incontinence, feces   . PTSD (post-traumatic stress disorder)     Past Surgical History:  Procedure Laterality Date  . TUBAL LIGATION       reports that she has been smoking cigarettes. She has never used smokeless tobacco. She reports current drug use. Drug: Marijuana. She reports that she does not drink alcohol. Social History  Allergies  Allergen Reactions  . Sulfa Antibiotics Hives    Family History  Problem Relation Age of Onset  . Heart disease Mother   . Congestive Heart Failure Mother   . Congestive Heart Failure Sister   . Diabetes Maternal Grandmother      Prior to Admission medications   Medication Sig Start Date End Date Taking? Authorizing Provider  acetaminophen (TYLENOL) 500 MG tablet Take 1,000 mg by mouth every 6 (six) hours as needed for moderate pain.   Yes [provider]  Chlorphen-Pseudoephed-APAP (THERAFLU FLU/COLD PO) Take 1 tablet by mouth daily.   Yes [provider]  insulin aspart (NOVOLOG) 100 UNIT/ML FlexPen Inject 10-12 Units into the skin 3 (three) times daily with meals. Patient taking differently: Inject 12-15 Units into the skin 3 (three) times daily with meals. Sliding scale 11/18/17  Yes [name], NP  insulin degludec (TRESIBA FLEXTOUCH) 100 UNIT/ML FlexTouch Pen Inject 12-16 Units into the skin at bedtime. 08/22/19  Yes [provider]  glucose blood test strip Use as instructed 11/18/17  [name], NP  Insulin Glargine (LANTUS) 100 UNIT/ML Solostar Pen Inject 20 Units into the skin daily at 10 pm. Patient not taking: Reported on 10/06/2020 11/18/17 12/18/17  [name], NP  Insulin Pen Needle (B-D UF III MINI PEN NEEDLES) 31G X 5 MM MISC Use as instructed 11/18/17   [name], NP  Lancets MISC by Does not apply  route. 07/09/13   [provider]    Physical Exam: Vitals:   10/06/20 1809 10/06/20 1827 10/06/20 1830 10/06/20 1932  BP:  (!) 86/56 (!) 87/53 109/67  Pulse: (!) 103   (!) 110  Resp:    18  Temp:      TempSrc:      SpO2: 94%   100%  Weight:      Height:        Constitutional: NAD, calm, comfortable, non-toxic appearing middle age female laying at 32 degree incline in bed Vitals:   10/06/20 1809 10/06/20 1827 10/06/20 1830 10/06/20 1932  BP:  (!) 86/56 (!) 87/53 109/67  Pulse: (!) 103   (!) 110  Resp:    18  Temp:      TempSrc:      SpO2: 94%   100%  Weight:      Height:       Eyes: PERRL, lids and conjunctivae normal ENMT: Mucous membranes are moist.  Neck: normal, supple Respiratory: clear to auscultation bilaterally, no wheezing, no crackles. Normal respiratory effort. No accessory muscle use.  Cardiovascular: Sinus tachycardia on telemetry, no murmurs / rubs / gallops. No extremity edema. Abdomen: no tenderness, no masses palpated. Bowel sounds positive.  Musculoskeletal: no clubbing / cyanosis. No joint deformity upper and lower extremities.  Skin: no rashes, lesions, ulcers. No induration Neurologic: CN 2-12 grossly intact. Sensation intact,  Strength 5/5 in all 4.  Psychiatric: Normal judgment and insight. Alert and oriented x 3. Normal mood.     Labs on Admission: I have personally reviewed following labs and imaging studies  CBC: Recent Labs  Lab 10/06/20 1802  WBC 5.3  NEUTROABS 3.8  HGB 11.9*  HCT 37.9  MCV 95.2  PLT 246   Basic Metabolic Panel: Recent Labs  Lab 10/06/20 1802  NA 126*  K 5.3*  CL 94*  CO2 14*  GLUCOSE 804*  BUN 23*  CREATININE 1.22*  CALCIUM 8.3*   GFR: Estimated Creatinine Clearance: 50.8 mL/min (A) (by C-G formula based on SCr of 1.22 mg/dL (H)). Liver Function Tests: No results for input(s): AST, ALT, ALKPHOS, BILITOT, PROT, ALBUMIN in the last 168 hours. No results for input(s): LIPASE, AMYLASE in the last  168 hours. No results for input(s): AMMONIA in the last 168 hours. Coagulation Profile: No results for input(s): INR, PROTIME in the last 168 hours. Cardiac Enzymes: No results for input(s): CKTOTAL, CKMB, CKMBINDEX, TROPONINI in the last 168 hours. BNP (last 3 results) No results for input(s): PROBNP in the last 8760 hours. HbA1C: No results for input(s): HGBA1C in the last 72 hours. CBG: No results for input(s): GLUCAP in the last 168 hours. Lipid Profile: No results for input(s): CHOL, HDL, LDLCALC, TRIG, CHOLHDL, LDLDIRECT in the last 72 hours. Thyroid Function Tests: No results for input(s): TSH, T4TOTAL, FREET4, T3FREE, THYROIDAB in the last 72 hours. Anemia Panel: No results for input(s): VITAMINB12, FOLATE, FERRITIN, TIBC, IRON, RETICCTPCT in the last 72 hours. Urine analysis:    Component Value Date/Time   COLORURINE STRAW (A) 10/06/2020 1913   APPEARANCEUR CLEAR 10/06/2020  1913   LABSPEC 1.023 10/06/2020 1913   PHURINE 5.0 10/06/2020 1913   GLUCOSEU >=500 (A) 10/06/2020 1913   HGBUR MODERATE (A) 10/06/2020 1913   BILIRUBINUR NEGATIVE 10/06/2020 1913   KETONESUR 80 (A) 10/06/2020 1913   PROTEINUR NEGATIVE 10/06/2020 1913   NITRITE NEGATIVE 10/06/2020 1913   LEUKOCYTESUR NEGATIVE 10/06/2020 1913    Radiological Exams on Admission: DG Chest Portable 1 View  Result Date: 10/06/2020 CLINICAL DATA:  Cough.  Myalgias.  Chills.  Rhinorrhea. EXAM: PORTABLE CHEST 1 VIEW COMPARISON:  Report dated 12/25/2015 FINDINGS: Normal sized heart. Clear lungs with normal vascularity. Moderate dextroconvex thoracic scoliosis. IMPRESSION: No acute abnormality. Electronically Signed   By: Beckie Salts M.D.   On: 10/06/2020 17:42      Assessment/Plan  DKA w/hx of uncontrolled Type 1 DM  PH of 7.23, BG of 804, CO2 of 14, gap of 18 - replete potassium as needed - continue insulin gtt with goal of 140-180 and AG <12 - IV NS until BG <250, then switch to D5 1/2 NS  - BMP q4hr  - keep  NPO - obtain HbA1C   Viral URI COVID Ag negative. Pending PCR test.  If negative, will obtain respiratory viral panel  Hypotension Due to hypovolemia from osmotic shifts Monitor with continuous IV fluids  AKI Creatinine of 1.22 admission Follow with repeat labs after continuous IV fluids overnight  Hyperlipidemia No statin on pt's med and is not listed in allergy list Need moderate-intensity statin when able to take PO  DVT prophylaxis:.Lovenox Code Status: Full Family Communication: Plan discussed with patient at bedside  disposition Plan: Home with at least 2 midnight stays  Consults called:  Admission status: inpatient  Level of care: Stepdown  Status is: Inpatient  Remains inpatient appropriate because:Inpatient level of care appropriate due to severity of illness   Dispo: The patient is from: Home              Anticipated d/c is to: Home              Patient currently is not medically stable to d/c.   Difficult to place patient No         Anselm Jungling DO [facility]   If 7PM-7AM, please contact night-coverage [email or web address]   10/06/2020, 8:20 PM

## 2020-10-06 NOTE — ED Notes (Signed)
ED TO INPATIENT HANDOFF REPORT  ED Nurse Name and Phone #: Clarise Cruz [phone]  S Name/Age/Gender Connie Vaughn 45 y.o. female Room/Bed: RESB/RESB  Code Status   Code Status: Full Code  Home/SNF/Other Home Patient oriented to: self, place, time and situation Is this baseline? Yes   Triage Complete: Triage complete  Chief Complaint DKA (diabetic ketoacidosis) ([place]) [E11.10]  Triage Note No notes on file   Allergies Allergies  Allergen Reactions  . Sulfa Antibiotics Hives    Level of Care/Admitting Diagnosis ED Disposition    ED Disposition Condition Comment   Admit  Hospital Area: [place] [[id number]]  Level of Care: Stepdown [14]  Admit to SDU based on following criteria: Other see comments  Comments: DKA  May admit patient to [facility] or [facility] if equivalent level of care is available:: No  Covid Evaluation: Symptomatic Person Under Investigation (PUI)  Diagnosis: DKA (diabetic ketoacidosis) [facility]) [[id number]]  Admitting Physician: Orene Desanctis [[id number]]  Attending Physician: Orene Desanctis [[id number]]  Estimated length of stay: past midnight tomorrow  Certification:: I certify this patient will need inpatient services for at least 2 midnights       B Medical/Surgery History Past Medical History:  Diagnosis Date  . Diabetes mellitus type 1, uncontrolled ([place])   . Incontinence, feces   . PTSD (post-traumatic stress disorder)    Past Surgical History:  Procedure Laterality Date  . TUBAL LIGATION       A IV Location/Drains/Wounds Patient Lines/Drains/Airways Status    Active Line/Drains/Airways    Name Placement date Placement time Site Days   Peripheral IV 10/06/20 Right Antecubital 10/06/20  1919  Antecubital  less than 1          Intake/Output Last 24 hours No intake or output data in the 24 hours ending 10/06/20 2050  Labs/Imaging Results for orders placed or performed during the hospital encounter of 10/06/20 (from the past  48 hour(s))  CBC with Differential     Status: Abnormal   Collection Time: 10/06/20  6:02 PM  Result Value Ref Range   WBC 5.3 4.0 - 10.5 K/uL   RBC 3.98 3.87 - 5.11 MIL/uL   Hemoglobin 11.9 (L) 12.0 - 15.0 g/dL   HCT 37.9 36.0 - 46.0 %   MCV 95.2 80.0 - 100.0 fL   MCH 29.9 26.0 - 34.0 pg   MCHC 31.4 30.0 - 36.0 g/dL   RDW 13.2 11.5 - 15.5 %   Platelets 246 150 - 400 K/uL   nRBC 0.0 0.0 - 0.2 %   Neutrophils Relative % 71 %   Neutro Abs 3.8 1.7 - 7.7 K/uL   Lymphocytes Relative 19 %   Lymphs Abs 1.0 0.7 - 4.0 K/uL   Monocytes Relative 8 %   Monocytes Absolute 0.4 0.1 - 1.0 K/uL   Eosinophils Relative 1 %   Eosinophils Absolute 0.0 0.0 - 0.5 K/uL   Basophils Relative 1 %   Basophils Absolute 0.0 0.0 - 0.1 K/uL   Immature Granulocytes 0 %   Abs Immature Granulocytes 0.02 0.00 - 0.07 K/uL    Comment: Performed at [facility], [place] [address] [place], [place] [address]  Basic metabolic panel     Status: Abnormal   Collection Time: 10/06/20  6:02 PM  Result Value Ref Range   Sodium 126 (L) 135 - 145 mmol/L   Potassium 5.3 (H) 3.5 - 5.1 mmol/L   Chloride 94 (L) 98 - 111 mmol/L  CO2 14 (L) 22 - 32 mmol/L   Glucose, Bld 804 (HH) 70 - 99 mg/dL    Comment: CRITICAL RESULT CALLED TO, READ BACK BY AND VERIFIED WITH: SUSAN CLAPP RN 10/06/20 '@1903'  BY P.HENDERSON    BUN 23 (H) 6 - 20 mg/dL   Creatinine, Ser 1.22 (H) 0.44 - 1.00 mg/dL   Calcium 8.3 (L) 8.9 - 10.3 mg/dL   GFR, Estimated 56 (L) >60 mL/min    Comment: (NOTE) Calculated using the CKD-EPI Creatinine Equation (2021)    Anion gap 18 (H) 5 - 15    Comment: Performed at [facility], [place] [address] [place], [place] [address]  POC SARS Coronavirus 2 Ag-ED - Nasal Swab (BD Veritor Kit)     Status: None   Collection Time: 10/06/20  6:55 PM  Result Value Ref Range   SARS Coronavirus 2 Ag NEGATIVE NEGATIVE    Comment: (NOTE) SARS-CoV-2 antigen NOT DETECTED.   Negative results are  presumptive.  Negative results do not preclude SARS-CoV-2 infection and should not be used as the sole basis for treatment or other patient management decisions, including infection  control decisions, particularly in the presence of clinical signs and  symptoms consistent with COVID-19, or in those who have been in contact with the virus.  Negative results must be combined with clinical observations, patient history, and epidemiological information. The expected result is Negative.  Fact Sheet for Patients: HandmadeRecipes.com.cy  Fact Sheet for Healthcare Providers: FuneralLife.at  This test is not yet approved or cleared by the [place] FDA and  has been authorized for detection and/or diagnosis of SARS-CoV-2 by FDA under an Emergency Use Authorization (EUA).  This EUA will remain in effect (meaning this test can be used) for the duration of  the COV ID-19 declaration under Section 564(b)(1) of the Act, 21 U.S.C. section 360bbb-3(b)(1), unless the authorization is terminated or revoked sooner.    Urinalysis, Routine w reflex microscopic     Status: Abnormal   Collection Time: 10/06/20  7:13 PM  Result Value Ref Range   Color, Urine STRAW (A) YELLOW   APPearance CLEAR CLEAR   Specific Gravity, Urine 1.023 1.005 - 1.030   pH 5.0 5.0 - 8.0   Glucose, UA >=500 (A) NEGATIVE mg/dL   Hgb urine dipstick MODERATE (A) NEGATIVE   Bilirubin Urine NEGATIVE NEGATIVE   Ketones, ur 80 (A) NEGATIVE mg/dL   Protein, ur NEGATIVE NEGATIVE mg/dL   Nitrite NEGATIVE NEGATIVE   Leukocytes,Ua NEGATIVE NEGATIVE   RBC / HPF 6-10 0 - 5 RBC/hpf   Bacteria, UA NONE SEEN NONE SEEN   Squamous Epithelial / LPF 0-5 0 - 5    Comment: Performed at [facility], [place] [address] [place], [place] [address]  CBG monitoring, ED     Status: Abnormal   Collection Time: 10/06/20  8:06 PM  Result Value Ref Range   Glucose-Capillary >600 (HH) 70  - 99 mg/dL    Comment: Glucose reference range applies only to samples taken after fasting for at least 8 hours.  Blood gas, venous (at [facility] and [facility], not at [facility])     Status: Abnormal   Collection Time: 10/06/20  8:07 PM  Result Value Ref Range   FIO2 21.00    pH, Ven 7.236 (L) 7.250 - 7.430   pCO2, Ven 30.7 (L) 44.0 - 60.0 mmHg   pO2, Ven 58.0 (H) 32.0 - 45.0 mmHg   Bicarbonate 12.6 (L) 20.0 - 28.0 mmol/L  Acid-base deficit 13.6 (H) 0.0 - 2.0 mmol/L   O2 Saturation 84.8 %   Patient temperature 98.6     Comment: Performed at [facility], [place] [address], [place], [place] [address]  I-Stat beta hCG blood, ED     Status: None   Collection Time: 10/06/20  8:13 PM  Result Value Ref Range   I-stat hCG, quantitative <5.0 <5 mIU/mL   Comment 3            Comment:   GEST. AGE      CONC.  (mIU/mL)   <=1 WEEK        5 - 50     2 WEEKS       50 - 500     3 WEEKS       100 - 10,000     4 WEEKS     1,000 - 30,000        FEMALE AND NON-PREGNANT FEMALE:     LESS THAN 5 mIU/mL    DG Chest Portable 1 View  Result Date: 10/06/2020 CLINICAL DATA:  Cough.  Myalgias.  Chills.  Rhinorrhea. EXAM: PORTABLE CHEST 1 VIEW COMPARISON:  Report dated 12/25/2015 FINDINGS: Normal sized heart. Clear lungs with normal vascularity. Moderate dextroconvex thoracic scoliosis. IMPRESSION: No acute abnormality. Electronically Signed   By: Claudie Revering M.D.   On: 10/06/2020 17:42    Pending Labs Unresulted Labs (From admission, onward)          Start     Ordered   10/06/20 5974  Basic metabolic panel  (Diabetes Ketoacidosis (DKA))  STAT Now then every 4 hours ,   STAT      10/06/20 2019   10/06/20 2019  Hemoglobin A1c  (Diabetes Ketoacidosis (DKA))  Once,   STAT       Comments: To assess prior glycemic control.    10/06/20 2019   10/06/20 1906  Beta-hydroxybutyric acid  Once,   STAT        10/06/20 1905   10/06/20 1857  SARS CORONAVIRUS 2 (TAT 6-24 HRS) Nasopharyngeal Nasopharyngeal Swab  (Tier 3  - Symptomatic/asymptomatic with Precautions)  Once,   STAT       Question Answer Comment  Is this test for diagnosis or screening Diagnosis of ill patient   Symptomatic for COVID-19 as defined by CDC Yes   Date of Symptom Onset 10/02/2020   Hospitalized for COVID-19 Yes   Admitted to ICU for COVID-19 No   Previously tested for COVID-19 Unknown   Resident in a congregate (group) care setting No   Employed in healthcare setting Unknown   Pregnant Unknown   Has patient completed COVID vaccination(s) (2 doses of Pfizer/Moderna 1 dose of [employer]) Yes   Has patient completed COVID Booster / 3rd dose No      10/06/20 1856          Vitals/Pain Today's Vitals   10/06/20 1809 10/06/20 1827 10/06/20 1830 10/06/20 1932  BP:  (!) 86/56 (!) 87/53 109/67  Pulse: (!) 103   (!) 110  Resp:    18  Temp:      TempSrc:      SpO2: 94%   100%  Weight:      Height:      PainSc:        Isolation Precautions Airborne and Contact precautions  Medications Medications  insulin regular, human (MYXREDLIN) 100 units/ 100 mL infusion (7 Units/hr Intravenous New Bag/Given 10/06/20 2024)  lactated  ringers infusion ( Intravenous New Bag/Given 10/06/20 2026)  dextrose 5 % in lactated ringers infusion (0 mLs Intravenous Hold 10/06/20 1941)  dextrose 50 % solution 0-50 mL (has no administration in time range)  enoxaparin (LOVENOX) injection 40 mg (has no administration in time range)  sodium chloride 0.9 % bolus 1,000 mL (0 mLs Intravenous Stopped 10/06/20 2027)  lactated ringers bolus 1,152 mL (1,152 mLs Intravenous New Bag/Given 10/06/20 2026)    Mobility walks Low fall risk   Focused Assessments    R Recommendations: See Admitting Provider Note  Report given to:   Additional Notes:

## 2020-10-06 NOTE — ED Notes (Signed)
Per DO, after discussion with patient, patient is agreeable to finger sticks.

## 2020-10-06 NOTE — ED Notes (Signed)
Unsuccessful IV attempt x2.  

## 2020-10-07 LAB — BASIC METABOLIC PANEL
Anion gap: 10 (ref 5–15)
Anion gap: 6 (ref 5–15)
BUN: 16 mg/dL (ref 6–20)
BUN: 13 mg/dL (ref 6–20)
CO2: 21 mmol/L — ABNORMAL LOW (ref 22–32)
CO2: 22 mmol/L (ref 22–32)
Calcium: 8.2 mg/dL — ABNORMAL LOW (ref 8.9–10.3)
Calcium: 8.1 mg/dL — ABNORMAL LOW (ref 8.9–10.3)
Chloride: 105 mmol/L (ref 98–111)
Chloride: 110 mmol/L (ref 98–111)
Creatinine, Ser: 0.65 mg/dL (ref 0.44–1.00)
Creatinine, Ser: 0.58 mg/dL (ref 0.44–1.00)
GFR, Estimated: >60 mL/min (ref >60)
GFR, Estimated: >60 mL/min (ref >60)
Glucose, Bld: 232 mg/dL — ABNORMAL HIGH (ref 70–99)
Glucose, Bld: 129 mg/dL — ABNORMAL HIGH (ref 70–99)
Potassium: 3.5 mmol/L (ref 3.5–5.1)
Potassium: 3.8 mmol/L (ref 3.5–5.1)
Sodium: 136 mmol/L (ref 135–145)
Sodium: 138 mmol/L (ref 135–145)

## 2020-10-07 LAB — HEMOGLOBIN A1C
Hgb A1c MFr Bld: 10.8 % — ABNORMAL HIGH (ref 4.8–5.6)
Hgb A1c MFr Bld: 11.1 % — ABNORMAL HIGH (ref 4.8–5.6)
Mean Plasma Glucose: 263.26 mg/dL
Mean Plasma Glucose: 271.87 mg/dL

## 2020-10-07 LAB — GLUCOSE, CAPILLARY
Glucose-Capillary: 228 mg/dL — ABNORMAL HIGH (ref 70–99)
Glucose-Capillary: 182 mg/dL — ABNORMAL HIGH (ref 70–99)
Glucose-Capillary: 194 mg/dL — ABNORMAL HIGH (ref 70–99)
Glucose-Capillary: 155 mg/dL — ABNORMAL HIGH (ref 70–99)
Glucose-Capillary: 137 mg/dL — ABNORMAL HIGH (ref 70–99)
Glucose-Capillary: 156 mg/dL — ABNORMAL HIGH (ref 70–99)
Glucose-Capillary: 141 mg/dL — ABNORMAL HIGH (ref 70–99)

## 2020-10-07 LAB — SARS CORONAVIRUS 2 (TAT 6-24 HRS): SARS Coronavirus 2: NEGATIVE

## 2020-10-07 MED ORDER — BENZONATATE 100 MG PO CAPS
200.0000 mg | ORAL_CAPSULE | Freq: Three times a day (TID) | ORAL | Status: DC | PRN
  Administered 2020-10-07: 200 mg via ORAL
  Filled 2020-10-07: qty 2

## 2020-10-07 MED ORDER — INSULIN GLARGINE 100 UNIT/ML ~~LOC~~ SOLN
15.0000 [IU] | Freq: Every day | SUBCUTANEOUS | Status: DC
  Filled 2020-10-07: qty 0.15

## 2020-10-07 MED ORDER — INSULIN ASPART 100 UNIT/ML ~~LOC~~ SOLN: 0.0000 [IU] | Freq: Every day | SUBCUTANEOUS | Status: DC

## 2020-10-07 MED ORDER — INSULIN ASPART 100 UNIT/ML ~~LOC~~ SOLN: 3.0000 [IU] | Freq: Three times a day (TID) | SUBCUTANEOUS | Status: DC

## 2020-10-07 MED ORDER — INSULIN ASPART 100 UNIT/ML ~~LOC~~ SOLN: 0.0000 [IU] | Freq: Three times a day (TID) | SUBCUTANEOUS | Status: DC

## 2020-10-07 NOTE — Progress Notes (Signed)
Pt was addiment about leaving the hospital this morning. RN explained to pt the importance of staying until we completed the treatment for her DKA and until her COVID test resulted. MD was made aware of pt's intentions to leave. At [phone] pt began to take out her IV and tele leads. Education was completed again but pt signed herself out AMA. Husband present and walked her out.

## 2020-10-07 NOTE — Discharge Summary (Signed)
Physician Discharge Summary  Connie Vaughn ZOX:[id number] DOB: [birth year] DOA: 10/06/2020  PCP: [facility]  Admit date: 10/06/2020 Discharge date: 10/07/2020  PATIENT LEFT AGAINST MEDICAL ADVICE BEFORE SHE COULD BE SEEN AND EXAMINED  Brief/Interim Summary: 45 y.o. female with medical history significant for type 1diabetes, PTSD hyperlipidemia who presents with concerns of hypoglycemia  Discharge Diagnoses:  Principal Problem:   DKA (diabetic ketoacidosis) (HCC) Active Problems:   DKA, type 1 (HCC)   AKI (acute kidney injury) (HCC)   HLD (hyperlipidemia)  DKA w/hx of uncontrolled Type 1 DM  PH of 7.23, BG of 804, CO2 of 14, gap of 18 -Patient was continued on IVF and insulin gtt overnight -Glucose trends improved and anion gap closed -Orders were placed for transition off insulin gtt and onto subq insulin, however before regimen could be initiated, patient had already elected to leave against medical advice. Pt was aware of risks involved in leaving when not medically stable, see nursing documentation  Viral URI COVID neg  Hypotension Due to hypovolemia from osmotic shifts Was given IVF hydration overnight  AKI Creatinine of 1.22 admission Renal function normalized with IVF  Hyperlipidemia No statin on pt's med and is not listed in allergy list   Discharge Instructions   Allergies as of 10/07/2020      Reactions   Sulfa Antibiotics Hives      Medication List    ASK your doctor about these medications   acetaminophen 500 MG tablet Commonly known as: TYLENOL Take 1,000 mg by mouth every 6 (six) hours as needed for moderate pain.   glucose blood test strip Use as instructed   insulin aspart 100 UNIT/ML FlexPen Commonly known as: NOVOLOG Inject 10-12 Units into the skin 3 (three) times daily with meals.   insulin glargine 100 UNIT/ML Solostar Pen Commonly known as: LANTUS Inject 20 Units into the skin daily at 10 pm.   Insulin Pen  Needle 31G X 5 MM Misc Commonly known as: B-D UF III MINI PEN NEEDLES Use as instructed   Lancets Misc by Does not apply route.   THERAFLU FLU/COLD PO Take 1 tablet by mouth daily.   [name] FlexTouch 100 UNIT/ML FlexTouch Pen Generic drug: insulin degludec Inject 12-16 Units into the skin at bedtime.       Allergies  Allergen Reactions  . Sulfa Antibiotics Hives    Procedures/Studies: DG Chest Portable 1 View  Result Date: 10/06/2020 CLINICAL DATA:  Cough.  Myalgias.  Chills.  Rhinorrhea. EXAM: PORTABLE CHEST 1 VIEW COMPARISON:  Report dated 12/25/2015 FINDINGS: Normal sized heart. Clear lungs with normal vascularity. Moderate dextroconvex thoracic scoliosis. IMPRESSION: No acute abnormality. Electronically Signed   By: Beckie Salts M.D.   On: 10/06/2020 17:42     Patient was not seen or examined because she elected to leave against medical advice before she could be seen   Discharge Exam: Vitals:   10/07/20 0700 10/07/20 0800  BP: 123/70   Pulse: 81 81  Resp: 13 14  Temp:    SpO2: 97% 100%   Vitals:   10/07/20 0045 10/07/20 0336 10/07/20 0700 10/07/20 0800  BP: 128/79  123/70   Pulse:   81 81  Resp: 15  13 14   Temp:  98.4 F (36.9 C)    TempSrc:  Oral    SpO2:   97% 100%  Weight:      Height:        The results of significant diagnostics from this hospitalization (including imaging,  microbiology, ancillary and laboratory) are listed below for reference.     Microbiology: Recent Results (from the past 240 hour(s))  SARS CORONAVIRUS 2 (TAT 6-24 HRS) Nasopharyngeal Nasopharyngeal Swab     Status: None   Collection Time: 10/06/20  8:07 PM   Specimen: Nasopharyngeal Swab  Result Value Ref Range Status   SARS Coronavirus 2 NEGATIVE NEGATIVE Final    Comment: (NOTE) SARS-CoV-2 target nucleic acids are NOT DETECTED.  The SARS-CoV-2 RNA is generally detectable in upper and lower respiratory specimens during the acute phase of infection. Negative results do  not preclude SARS-CoV-2 infection, do not rule out co-infections with other pathogens, and should not be used as the sole basis for treatment or other patient management decisions. Negative results must be combined with clinical observations, patient history, and epidemiological information. The expected result is Negative.  Fact Sheet for Patients: HairSlick.no  Fact Sheet for Healthcare Providers: quierodirigir.com  This test is not yet approved or cleared by the [place] FDA and  has been authorized for detection and/or diagnosis of SARS-CoV-2 by FDA under an Emergency Use Authorization (EUA). This EUA will remain  in effect (meaning this test can be used) for the duration of the COVID-19 declaration under Se ction 564(b)(1) of the Act, 21 U.S.C. section 360bbb-3(b)(1), unless the authorization is terminated or revoked sooner.  Performed at [facility], [address] [place], [place] [address]   MRSA PCR Screening     Status: None   Collection Time: 10/06/20 10:01 PM   Specimen: Nasal Mucosa; Nasopharyngeal  Result Value Ref Range Status   MRSA by PCR NEGATIVE NEGATIVE Final    Comment:        The GeneXpert MRSA Assay (FDA approved for NASAL specimens only), is one component of a comprehensive MRSA colonization surveillance program. It is not intended to diagnose MRSA infection nor to guide or monitor treatment for MRSA infections. Performed at [facility], [address], [place], [place] [address]      Labs: BNP (last 3 results) No results for input(s): BNP in the last 8760 hours. Basic Metabolic Panel: Recent Labs  Lab 10/06/20 1802 10/07/20 0104 10/07/20 0801  NA 126* 136 138  K 5.3* 3.5 3.8  CL 94* 105 110  CO2 14* 21* 22  GLUCOSE 804* 232* 129*  BUN 23* 16 13  CREATININE 1.22* 0.65 0.58  CALCIUM 8.3* 8.2* 8.1*   Liver Function Tests: No results for input(s):  AST, ALT, ALKPHOS, BILITOT, PROT, ALBUMIN in the last 168 hours. No results for input(s): LIPASE, AMYLASE in the last 168 hours. No results for input(s): AMMONIA in the last 168 hours. CBC: Recent Labs  Lab 10/06/20 1802  WBC 5.3  NEUTROABS 3.8  HGB 11.9*  HCT 37.9  MCV 95.2  PLT 246   Cardiac Enzymes: No results for input(s): CKTOTAL, CKMB, CKMBINDEX, TROPONINI in the last 168 hours. BNP: Invalid input(s): POCBNP CBG: Recent Labs  Lab 10/07/20 0335 10/07/20 0518 10/07/20 0603 10/07/20 0707 10/07/20 0809  GLUCAP 194* 155* 137* 156* 141*   D-Dimer No results for input(s): DDIMER in the last 72 hours. Hgb A1c Recent Labs    10/07/20 0911  HGBA1C 11.1*   Lipid Profile No results for input(s): CHOL, HDL, LDLCALC, TRIG, CHOLHDL, LDLDIRECT in the last 72 hours. Thyroid function studies No results for input(s): TSH, T4TOTAL, T3FREE, THYROIDAB in the last 72 hours.  Invalid input(s): FREET3 Anemia work up No results for input(s): VITAMINB12, FOLATE, FERRITIN,  TIBC, IRON, RETICCTPCT in the last 72 hours. Urinalysis    Component Value Date/Time   COLORURINE STRAW (A) 10/06/2020 1913   APPEARANCEUR CLEAR 10/06/2020 1913   LABSPEC 1.023 10/06/2020 1913   PHURINE 5.0 10/06/2020 1913   GLUCOSEU >=500 (A) 10/06/2020 1913   HGBUR MODERATE (A) 10/06/2020 1913   BILIRUBINUR NEGATIVE 10/06/2020 1913   KETONESUR 80 (A) 10/06/2020 1913   PROTEINUR NEGATIVE 10/06/2020 1913   NITRITE NEGATIVE 10/06/2020 1913   LEUKOCYTESUR NEGATIVE 10/06/2020 1913   Sepsis Labs Invalid input(s): PROCALCITONIN,  WBC,  LACTICIDVEN Microbiology Recent Results (from the past 240 hour(s))  SARS CORONAVIRUS 2 (TAT 6-24 HRS) Nasopharyngeal Nasopharyngeal Swab     Status: None   Collection Time: 10/06/20  8:07 PM   Specimen: Nasopharyngeal Swab  Result Value Ref Range Status   SARS Coronavirus 2 NEGATIVE NEGATIVE Final    Comment: (NOTE) SARS-CoV-2 target nucleic acids are NOT DETECTED.  The  SARS-CoV-2 RNA is generally detectable in upper and lower respiratory specimens during the acute phase of infection. Negative results do not preclude SARS-CoV-2 infection, do not rule out co-infections with other pathogens, and should not be used as the sole basis for treatment or other patient management decisions. Negative results must be combined with clinical observations, patient history, and epidemiological information. The expected result is Negative.  Fact Sheet for Patients: HairSlick.no  Fact Sheet for Healthcare Providers: quierodirigir.com  This test is not yet approved or cleared by the [place] FDA and  has been authorized for detection and/or diagnosis of SARS-CoV-2 by FDA under an Emergency Use Authorization (EUA). This EUA will remain  in effect (meaning this test can be used) for the duration of the COVID-19 declaration under Se ction 564(b)(1) of the Act, 21 U.S.C. section 360bbb-3(b)(1), unless the authorization is terminated or revoked sooner.  Performed at [facility], [address], [place], [place] [address]   MRSA PCR Screening     Status: None   Collection Time: 10/06/20 10:01 PM   Specimen: Nasal Mucosa; Nasopharyngeal  Result Value Ref Range Status   MRSA by PCR NEGATIVE NEGATIVE Final    Comment:        The GeneXpert MRSA Assay (FDA approved for NASAL specimens only), is one component of a comprehensive MRSA colonization surveillance program. It is not intended to diagnose MRSA infection nor to guide or monitor treatment for MRSA infections. Performed at [facility], [address], [place], [place] [address]      SIGNED:   Rickey Barbara, MD  [facility] 10/07/2020, 3:57 PM  If 7PM-7AM, please contact night-coverage

## 2020-10-07 NOTE — Progress Notes (Signed)
Inpatient Diabetes Program Recommendations  AACE/ADA: New Consensus Statement on Inpatient Glycemic Control (2015)  Target Ranges:  Prepandial:   less than 140 mg/dL      Peak postprandial:   less than 180 mg/dL (1-2 hours)      Critically ill patients:  140 - 180 mg/dL   Lab Results  Component Value Date   GLUCAP 141 (H) 10/07/2020   HGBA1C 11.8 (H) 10/16/2017    Review of Glycemic Control  Diabetes history: DM1 Outpatient Diabetes medications: Tresiba 12-16 units daily + Novolog 12-15 units meal coverage Current orders for Inpatient glycemic control: IV insulin  Inpatient Diabetes Program Recommendations:   When transitioning to subcutaneous insulin give basal insulin 2 hrs. Prior to IV insulin discontinued and cover CBG with correction scale @ time drip is stopped. Consider: -Lantus 15 (.8 x home dose of 18 units =14.4 units) -Novolog 0-9 units q 4 hrs. While NPO then tid + hs 0-5 units -Novolog meal coverage tid when eating 50% meals-consider 3 units and adjust as eating improves  "Connie Amen, MD - 03/28/2020 11:27 AM EDT Formatting of this note is different from the original. History of Present Illness This is a 45 y.o. female who returns for follow-up of type 1 diabetes. Connie Vaughn continues to cite multiple psychosocial and financial barriers which she has been unwilling or unable to address. These have continued to affect her diabetes management including her motivation to make improvements. She is only scanning blood sugars 1-2 times with her Freestyle Libre leading to frequent signal loss. Her sensor has only been active 41% of the time over the past 2 weeks. Her 14 day average glucose is 261 with only 25% time in target range (glucose 70-180) but 5% very low (under 54). No specific patterns can be determined. Hgb A1C today is 10.6%."  Secure chat sent to Dr. Rhona Vaughn.  Thank you, Connie Vaughn. Connie Kren, RN, MSN, CDE  Diabetes Coordinator Inpatient Glycemic Control Team Team Pager  [phone] (8am-5pm) 10/07/2020 8:49 AM

## 2021-02-13 ENCOUNTER — Encounter (INDEPENDENT_AMBULATORY_CARE_PROVIDER_SITE_OTHER): Payer: Managed Care, Other (non HMO) | Admitting: Ophthalmology

## 2021-03-05 ENCOUNTER — Ambulatory Visit (INDEPENDENT_AMBULATORY_CARE_PROVIDER_SITE_OTHER): Payer: BC Managed Care – PPO

## 2021-03-05 ENCOUNTER — Other Ambulatory Visit: Payer: Self-pay

## 2021-03-05 ENCOUNTER — Encounter (HOSPITAL_COMMUNITY): Payer: Self-pay | Admitting: Emergency Medicine

## 2021-03-05 ENCOUNTER — Ambulatory Visit (HOSPITAL_COMMUNITY)
Admission: EM | Admit: 2021-03-05 | Discharge: 2021-03-05 | Disposition: A | Discharge status: Home / Self Care | Payer: BC Managed Care – PPO

## 2021-03-05 ENCOUNTER — Encounter (INDEPENDENT_AMBULATORY_CARE_PROVIDER_SITE_OTHER): Payer: BC Managed Care – PPO | Admitting: Ophthalmology

## 2021-03-05 DIAGNOSIS — L03011 Cellulitis of right finger: Secondary | ICD-10-CM | POA: Diagnosis not present

## 2021-03-05 DIAGNOSIS — H43813 Vitreous degeneration, bilateral: Secondary | ICD-10-CM | POA: Diagnosis not present

## 2021-03-05 DIAGNOSIS — E113313 Type 2 diabetes mellitus with moderate nonproliferative diabetic retinopathy with macular edema, bilateral: Secondary | ICD-10-CM

## 2021-03-05 DIAGNOSIS — M79644 Pain in right finger(s): Secondary | ICD-10-CM | POA: Diagnosis not present

## 2021-03-05 MED ORDER — LIDOCAINE HCL (PF) 1 % IJ SOLN
INTRAMUSCULAR | Status: AC
  Filled 2021-03-05: qty 2

## 2021-03-05 MED ORDER — CEFTRIAXONE SODIUM 1 G IJ SOLR
INTRAMUSCULAR | Status: AC
  Filled 2021-03-05: qty 10

## 2021-03-05 MED ORDER — CEFTRIAXONE SODIUM 1 G IJ SOLR
1.0000 g | Freq: Once | INTRAMUSCULAR | Status: AC
  Administered 2021-03-05: 1 g via INTRAMUSCULAR

## 2021-03-05 NOTE — ED Provider Notes (Signed)
[facility]    CSN: [id number] Arrival date & time: 03/05/21  1001      History   Chief Complaint Chief Complaint  Patient presents with   Hand Pain    HPI Connie Vaughn is a 45 y.o. female.   Patient presents today for evaluation of right ring finger pain and swelling. She states initially pain and swelling was just noted to distal finger but has now spread to remainder of finger and she is now having some numbness in her forearm as well. She is a type 1 diabetic. She denies any fever. No nausea or vomiting. Did have headache today. 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 When swelling started to worsen over the weekend patient was seen at local healthcare office and was prescribed doxycycline but swelling seems to continue to spread.    Hand Pain Pertinent negatives include no shortness of breath.   Past Medical History:  Diagnosis Date   Diabetes mellitus type 1, uncontrolled (HCC)    Incontinence, feces    PTSD (post-traumatic stress disorder)     Patient Active Problem List   Diagnosis Date Noted   DKA (diabetic ketoacidosis) (HCC) 10/06/2020   AKI (acute kidney injury) (HCC) 10/06/2020   HLD (hyperlipidemia) 10/06/2020   DKA, type 1 (HCC) 10/16/2017   Diabetic ketoacidosis without coma associated with type 1 diabetes mellitus (HCC)     Past Surgical History:  Procedure Laterality Date   TUBAL LIGATION      OB History   No obstetric history on file.      Home Medications    Prior to Admission medications   Medication Sig Start Date End Date Taking? Authorizing Provider  doxycycline (MONODOX) 100 MG capsule Take 100 mg by mouth 2 (two) times daily.   Yes [provider]  acetaminophen (TYLENOL) 500 MG tablet Take 1,000 mg by mouth every 6 (six) hours as needed for moderate pain.    [provider]   Chlorphen-Pseudoephed-APAP (THERAFLU FLU/COLD PO) Take 1 tablet by mouth daily. Patient not taking: Reported on 03/05/2021    [provider]  glucose blood test strip Use as instructed 11/18/17   [name], NP  insulin aspart (NOVOLOG) 100 UNIT/ML FlexPen Inject 10-12 Units into the skin 3 (three) times daily with meals. Patient taking differently: Inject 12-15 Units into the skin 3 (three) times daily with meals. Sliding scale 11/18/17   [name], NP  insulin degludec (TRESIBA FLEXTOUCH) 100 UNIT/ML FlexTouch Pen Inject 12-16 Units into the skin at bedtime. 08/22/19   [provider]  Insulin Glargine (LANTUS) 100 UNIT/ML Solostar Pen Inject 20 Units into the skin daily at 10 pm. Patient not taking: Reported on 10/06/2020 11/18/17 12/18/17  [name], NP  Insulin Pen Needle (B-D UF III MINI PEN NEEDLES) 31G X 5 MM MISC Use as instructed 11/18/17   [name], NP  Lancets MISC by Does not apply route. 07/09/13   [provider]    Family History Family History  Problem Relation Age of Onset   Heart disease Mother    Congestive Heart Failure Mother    Congestive Heart Failure Sister    Diabetes Maternal Grandmother     Social History Social History   Tobacco Use   Smoking status: Every Day    Types: Cigarettes   Smokeless  tobacco: Never   Tobacco comments:    also uses Black and Mild  Vaping Use   Vaping Use: Never used  Substance Use Topics   Alcohol use: Never   Drug use: Yes    Types: Marijuana    Comment: edibles     Allergies   Sulfa antibiotics   Review of Systems Review of Systems  Constitutional:  Negative for chills and fever.  Eyes:  Negative for discharge and redness.  Respiratory:  Negative for shortness of breath.   Musculoskeletal:  Positive for arthralgias and joint swelling.  Skin:  Positive for color change.  Neurological:  Positive for numbness.    Physical Exam Triage Vital Signs ED Triage Vitals  [03/05/21 1051]  Enc Vitals Group     BP      Pulse      Resp      Temp      Temp src      SpO2      Weight      Height      Head Circumference      Peak Flow      Pain Score 10     Pain Loc      Pain Edu?      Excl. in GC?    No data found.  Updated Vital Signs BP 134/81 (BP Location: Left Arm)   Pulse 85   Temp 98 F (36.7 C) (Oral)   Resp 18   LMP 02/26/2021   SpO2 99%     Physical Exam Vitals and nursing note reviewed.  Constitutional:      General: She is not in acute distress.    Appearance: Normal appearance. She is not ill-appearing.  HENT:     Head: Normocephalic and atraumatic.  Eyes:     Conjunctiva/sclera: Conjunctivae normal.  Cardiovascular:     Rate and Rhythm: Normal rate.  Pulmonary:     Effort: Pulmonary effort is normal.  Musculoskeletal:     Comments: Decreased ROM of right ring finger due to diffuse swelling. Erythema seemingly worse around nail cuticle. Finger is tender to palpation.   Skin:    General: Skin is warm and dry.  Neurological:     Mental Status: She is alert.  Psychiatric:        Mood and Affect: Mood normal.        Thought Content: Thought content normal.     UC Treatments / Results  Labs (all labs ordered are listed, but only abnormal results are displayed) Labs Reviewed - No data to display  EKG   Radiology DG Finger Ring Right  Result Date: 03/05/2021 CLINICAL DATA:  Right fourth finger wound. EXAM: RIGHT RING FINGER 2+V COMPARISON:  None. FINDINGS: There is no evidence of fracture or dislocation. There is no evidence of arthropathy or other focal bone abnormality. Soft tissue swelling is noted around the proximal interphalangeal joint. Diffuse soft tissue swelling of the finger is noted suggesting cellulitis. IMPRESSION: Diffuse soft tissue swelling is seen involving the right fourth finger concerning for cellulitis. No lytic destruction is noted to suggest osteomyelitis. Electronically Signed   By: Lupita Raider  M.D.   On: 03/05/2021 11:41    Procedures Incision and Drainage  Date/Time: 03/05/2021 12:15 PM Performed by: Tomi Bamberger, PA-C Authorized by: Tomi Bamberger, PA-C   Consent:    Consent obtained:  Verbal   Consent given by:  Patient   Risks, benefits, and alternatives were discussed: yes  Risks discussed:  Incomplete drainage, pain and infection   Alternatives discussed:  Alternative treatment Universal protocol:    Procedure explained and questions answered to patient or proxy's satisfaction: yes     Relevant documents present and verified: yes     Test results available : yes     Imaging studies available: yes     Required blood products, implants, devices, and special equipment available: yes   Location:    Indications for incision and drainage: Paronychia.   Location:  Upper extremity   Upper extremity location:  Finger   Finger location:  R ring finger Pre-procedure details:    Skin preparation:  Povidone-iodine Sedation:    Sedation type:  None Procedure type:    Complexity:  Simple Procedure details:    Ultrasound guidance: no     Needle aspiration: no     Incision types:  Single straight   Incision depth:  Submucosal   Drainage:  Bloody   Drainage amount:  Scant   Wound treatment:  Wound left open Post-procedure details:    Procedure completion:  Tolerated well, no immediate complications (including critical care time)  Medications Ordered in UC Medications  cefTRIAXone (ROCEPHIN) injection 1 g (has no administration in time range)    Initial Impression / Assessment and Plan / UC Course  I have reviewed the triage vital signs and the nursing notes.  Pertinent labs & imaging results that were available during my care of the patient were reviewed by me and considered in my medical decision making (see chart for details).  Attempted paronychia drainage in office without significant drainage.  Concern for cellulitis on x-ray, and given presentation  with 5 doses of doxycycline will administer Rocephin injection in office today as well.  Recommended she continue doxycycline.  Encouraged follow-up in 24 hours if no improvement.  Recommend sooner follow-up with any worsening.  Final Clinical Impressions(s) / UC Diagnoses   Final diagnoses:  Paronychia of finger of right hand  Cellulitis of finger of right hand   Discharge Instructions   None    ED Prescriptions   None    PDMP not reviewed this encounter.   Tomi Bamberger, PA-C 03/05/21 1218

## 2021-03-05 NOTE — ED Triage Notes (Signed)
3 week history of right ring finger pain, swelling.  Over the weekend went to [facility] on [address] and was started on doxycycline. Started taking antibiotic on Saturday.  Finger has worsened, increased pain and swelling.  Pain includes portion of hand, numbness radiating up to right elbow.  Patient is a type 1 diabetic.    CBG readings for 2 weeks have been higher than usual.

## 2021-03-07 ENCOUNTER — Ambulatory Visit (HOSPITAL_COMMUNITY)
Admission: EM | Admit: 2021-03-07 | Discharge: 2021-03-07 | Discharge status: Against Medical Advice | Payer: BC Managed Care – PPO

## 2021-03-07 ENCOUNTER — Other Ambulatory Visit: Payer: Self-pay

## 2021-03-07 NOTE — ED Notes (Signed)
Called x2 from waiting room, no answer

## 2021-03-09 ENCOUNTER — Emergency Department (HOSPITAL_COMMUNITY): Payer: BC Managed Care – PPO | Admitting: Anesthesiology

## 2021-03-09 ENCOUNTER — Emergency Department (HOSPITAL_COMMUNITY): Payer: BC Managed Care – PPO

## 2021-03-09 ENCOUNTER — Other Ambulatory Visit: Payer: Self-pay

## 2021-03-09 ENCOUNTER — Encounter (HOSPITAL_COMMUNITY): Payer: Self-pay | Admitting: *Deleted

## 2021-03-09 ENCOUNTER — Ambulatory Visit (HOSPITAL_COMMUNITY)
Admission: EM | Admit: 2021-03-09 | Discharge: 2021-03-09 | Disposition: A | Discharge status: Home / Self Care | Payer: BC Managed Care – PPO | Attending: Emergency Medicine | Admitting: Emergency Medicine

## 2021-03-09 ENCOUNTER — Encounter (HOSPITAL_COMMUNITY)
Admission: EM | Disposition: A | Discharge status: Home / Self Care | Payer: Self-pay | Source: Home / Self Care | Attending: Emergency Medicine

## 2021-03-09 DIAGNOSIS — E109 Type 1 diabetes mellitus without complications: Secondary | ICD-10-CM | POA: Insufficient documentation

## 2021-03-09 DIAGNOSIS — M79644 Pain in right finger(s): Secondary | ICD-10-CM | POA: Diagnosis present

## 2021-03-09 DIAGNOSIS — Z794 Long term (current) use of insulin: Secondary | ICD-10-CM | POA: Diagnosis not present

## 2021-03-09 DIAGNOSIS — M7989 Other specified soft tissue disorders: Secondary | ICD-10-CM | POA: Insufficient documentation

## 2021-03-09 DIAGNOSIS — Z882 Allergy status to sulfonamides status: Secondary | ICD-10-CM | POA: Diagnosis not present

## 2021-03-09 DIAGNOSIS — Z20822 Contact with and (suspected) exposure to covid-19: Secondary | ICD-10-CM | POA: Diagnosis not present

## 2021-03-09 DIAGNOSIS — F17210 Nicotine dependence, cigarettes, uncomplicated: Secondary | ICD-10-CM | POA: Insufficient documentation

## 2021-03-09 DIAGNOSIS — Z833 Family history of diabetes mellitus: Secondary | ICD-10-CM | POA: Insufficient documentation

## 2021-03-09 DIAGNOSIS — Z9851 Tubal ligation status: Secondary | ICD-10-CM | POA: Diagnosis not present

## 2021-03-09 DIAGNOSIS — R079 Chest pain, unspecified: Secondary | ICD-10-CM

## 2021-03-09 DIAGNOSIS — F1290 Cannabis use, unspecified, uncomplicated: Secondary | ICD-10-CM | POA: Diagnosis not present

## 2021-03-09 DIAGNOSIS — Z8249 Family history of ischemic heart disease and other diseases of the circulatory system: Secondary | ICD-10-CM | POA: Diagnosis not present

## 2021-03-09 DIAGNOSIS — W230XXA Caught, crushed, jammed, or pinched between moving objects, initial encounter: Secondary | ICD-10-CM | POA: Insufficient documentation

## 2021-03-09 HISTORY — DX: Gastro-esophageal reflux disease without esophagitis: K21.9

## 2021-03-09 HISTORY — PX: I & D EXTREMITY: SHX5045

## 2021-03-09 HISTORY — DX: Attention-deficit hyperactivity disorder, unspecified type: F90.9

## 2021-03-09 HISTORY — DX: Anemia, unspecified: D64.9

## 2021-03-09 LAB — CBC WITH DIFFERENTIAL/PLATELET
Abs Immature Granulocytes: 0.04 10*3/uL (ref 0.00–0.07)
Basophils Absolute: 0.1 10*3/uL (ref 0.0–0.1)
Basophils Relative: 1 %
Eosinophils Absolute: 0.2 10*3/uL (ref 0.0–0.5)
Eosinophils Relative: 2 %
HCT: 36.8 % (ref 36.0–46.0)
Hemoglobin: 12.3 g/dL (ref 12.0–15.0)
Immature Granulocytes: 0 %
Lymphocytes Relative: 20 %
Lymphs Abs: 1.9 10*3/uL (ref 0.7–4.0)
MCH: 30.7 pg (ref 26.0–34.0)
MCHC: 33.4 g/dL (ref 30.0–36.0)
MCV: 91.8 fL (ref 80.0–100.0)
Monocytes Absolute: 0.7 10*3/uL (ref 0.1–1.0)
Monocytes Relative: 7 %
Neutro Abs: 6.9 10*3/uL (ref 1.7–7.7)
Neutrophils Relative %: 70 %
Platelets: 388 10*3/uL (ref 150–400)
RBC: 4.01 MIL/uL (ref 3.87–5.11)
RDW: 14.8 % (ref 11.5–15.5)
WBC: 9.8 10*3/uL (ref 4.0–10.5)
nRBC: 0 % (ref 0.0–0.2)

## 2021-03-09 LAB — COMPREHENSIVE METABOLIC PANEL
ALT: 22 U/L (ref 0–44)
AST: 26 U/L (ref 15–41)
Albumin: 3.4 g/dL — ABNORMAL LOW (ref 3.5–5.0)
Alkaline Phosphatase: 113 U/L (ref 38–126)
Anion gap: 10 (ref 5–15)
BUN: 25 mg/dL — ABNORMAL HIGH (ref 6–20)
CO2: 27 mmol/L (ref 22–32)
Calcium: 9.3 mg/dL (ref 8.9–10.3)
Chloride: 100 mmol/L (ref 98–111)
Creatinine, Ser: 0.77 mg/dL (ref 0.44–1.00)
GFR, Estimated: >60 mL/min (ref >60)
Glucose, Bld: 307 mg/dL — ABNORMAL HIGH (ref 70–99)
Potassium: 4.4 mmol/L (ref 3.5–5.1)
Sodium: 137 mmol/L (ref 135–145)
Total Bilirubin: 0.5 mg/dL (ref 0.3–1.2)
Total Protein: 6.8 g/dL (ref 6.5–8.1)

## 2021-03-09 LAB — I-STAT BETA HCG BLOOD, ED (MC, WL, AP ONLY): I-stat hCG, quantitative: <5 m[IU]/mL (ref <5)

## 2021-03-09 LAB — CBG MONITORING, ED
Glucose-Capillary: 300 mg/dL — ABNORMAL HIGH (ref 70–99)
Glucose-Capillary: 60 mg/dL — ABNORMAL LOW (ref 70–99)
Glucose-Capillary: 61 mg/dL — ABNORMAL LOW (ref 70–99)
Glucose-Capillary: 154 mg/dL — ABNORMAL HIGH (ref 70–99)

## 2021-03-09 LAB — LACTIC ACID, PLASMA
Lactic Acid, Venous: 2.2 mmol/L (ref 0.5–1.9)
Lactic Acid, Venous: 2.7 mmol/L (ref 0.5–1.9)

## 2021-03-09 LAB — GLUCOSE, CAPILLARY
Glucose-Capillary: 189 mg/dL — ABNORMAL HIGH (ref 70–99)
Glucose-Capillary: 317 mg/dL — ABNORMAL HIGH (ref 70–99)
Glucose-Capillary: 352 mg/dL — ABNORMAL HIGH (ref 70–99)
Glucose-Capillary: 356 mg/dL — ABNORMAL HIGH (ref 70–99)
Glucose-Capillary: 276 mg/dL — ABNORMAL HIGH (ref 70–99)

## 2021-03-09 LAB — APTT: aPTT: 25 seconds (ref 24–36)

## 2021-03-09 LAB — PROTIME-INR
INR: 0.8 (ref 0.8–1.2)
Prothrombin Time: 11.5 seconds (ref 11.4–15.2)

## 2021-03-09 LAB — SARS CORONAVIRUS 2 BY RT PCR (HOSPITAL ORDER, PERFORMED IN ~~LOC~~ HOSPITAL LAB): SARS Coronavirus 2: NEGATIVE

## 2021-03-09 SURGERY — IRRIGATION AND DEBRIDEMENT EXTREMITY
Anesthesia: General | Site: Hand | Laterality: Right

## 2021-03-09 MED ORDER — BUPIVACAINE HCL (PF) 0.25 % IJ SOLN
INTRAMUSCULAR | Status: DC | PRN
  Administered 2021-03-09: 10 mL

## 2021-03-09 MED ORDER — LIDOCAINE 2% (20 MG/ML) 5 ML SYRINGE
INTRAMUSCULAR | Status: DC | PRN
  Administered 2021-03-09: 40 mg via INTRAVENOUS

## 2021-03-09 MED ORDER — INSULIN ASPART 100 UNIT/ML IJ SOLN
10.0000 [IU] | Freq: Once | INTRAMUSCULAR | Status: AC
  Administered 2021-03-09: 10 [IU] via SUBCUTANEOUS

## 2021-03-09 MED ORDER — HYDROMORPHONE HCL 1 MG/ML IJ SOLN
1.0000 mg | INTRAMUSCULAR | Status: DC | PRN
Start: 2021-03-09 — End: 2021-03-10
  Administered 2021-03-09: 1 mg via INTRAVENOUS
  Filled 2021-03-09: qty 1

## 2021-03-09 MED ORDER — FENTANYL CITRATE (PF) 100 MCG/2ML IJ SOLN: 25.0000 ug | INTRAMUSCULAR | Status: DC | PRN

## 2021-03-09 MED ORDER — VANCOMYCIN HCL 1000 MG IV SOLR
INTRAVENOUS | Status: DC | PRN
  Administered 2021-03-09: 1000 mg via INTRAVENOUS

## 2021-03-09 MED ORDER — OXYCODONE HCL 5 MG PO TABS: 5.0000 mg | ORAL_TABLET | Freq: Once | ORAL | Status: DC | PRN

## 2021-03-09 MED ORDER — LACTATED RINGERS IV BOLUS
1000.0000 mL | Freq: Once | INTRAVENOUS | Status: AC
  Administered 2021-03-09: 1000 mL via INTRAVENOUS

## 2021-03-09 MED ORDER — DOXYCYCLINE HYCLATE 50 MG PO CAPS: 100.0000 mg | ORAL_CAPSULE | Freq: Two times a day (BID) | ORAL | 0 refills | Status: DC

## 2021-03-09 MED ORDER — ACETAMINOPHEN 160 MG/5ML PO SOLN: 325.0000 mg | ORAL | Status: DC | PRN

## 2021-03-09 MED ORDER — EPHEDRINE 5 MG/ML INJ
INTRAVENOUS | Status: AC
  Filled 2021-03-09: qty 5

## 2021-03-09 MED ORDER — AMISULPRIDE (ANTIEMETIC) 5 MG/2ML IV SOLN: 10.0000 mg | Freq: Once | INTRAVENOUS | Status: DC | PRN

## 2021-03-09 MED ORDER — INSULIN ASPART 100 UNIT/ML IJ SOLN
INTRAMUSCULAR | Status: AC
  Filled 2021-03-09: qty 1

## 2021-03-09 MED ORDER — ONDANSETRON HCL 4 MG/2ML IJ SOLN
INTRAMUSCULAR | Status: AC
  Filled 2021-03-09: qty 4

## 2021-03-09 MED ORDER — CHLORHEXIDINE GLUCONATE 0.12 % MT SOLN
OROMUCOSAL | Status: AC
  Administered 2021-03-09: 15 mL via OROMUCOSAL
  Filled 2021-03-09: qty 15

## 2021-03-09 MED ORDER — VANCOMYCIN HCL IN DEXTROSE 1-5 GM/200ML-% IV SOLN
INTRAVENOUS | Status: AC
  Filled 2021-03-09: qty 200

## 2021-03-09 MED ORDER — PROPOFOL 10 MG/ML IV BOLUS
INTRAVENOUS | Status: DC | PRN
  Administered 2021-03-09: 120 mg via INTRAVENOUS

## 2021-03-09 MED ORDER — PROMETHAZINE HCL 25 MG/ML IJ SOLN: 6.2500 mg | INTRAMUSCULAR | Status: DC | PRN

## 2021-03-09 MED ORDER — DEXTROSE 50 % IV SOLN
1.0000 | Freq: Once | INTRAVENOUS | Status: AC
  Administered 2021-03-09: 50 mL via INTRAVENOUS
  Filled 2021-03-09: qty 50

## 2021-03-09 MED ORDER — ACETAMINOPHEN 325 MG PO TABS: 325.0000 mg | ORAL_TABLET | ORAL | Status: DC | PRN

## 2021-03-09 MED ORDER — MORPHINE SULFATE (PF) 4 MG/ML IV SOLN
4.0000 mg | Freq: Once | INTRAVENOUS | Status: AC
  Administered 2021-03-09: 4 mg via INTRAVENOUS
  Filled 2021-03-09: qty 1

## 2021-03-09 MED ORDER — SUCCINYLCHOLINE CHLORIDE 200 MG/10ML IV SOSY
PREFILLED_SYRINGE | INTRAVENOUS | Status: DC | PRN
  Administered 2021-03-09: 100 mg via INTRAVENOUS

## 2021-03-09 MED ORDER — OXYCODONE HCL 5 MG/5ML PO SOLN
5.0000 mg | Freq: Once | ORAL | Status: DC | PRN
Start: 2021-03-09 — End: 2021-03-10

## 2021-03-09 MED ORDER — OXYCODONE-ACETAMINOPHEN 5-325 MG PO TABS: ORAL_TABLET | ORAL | 0 refills | Status: DC

## 2021-03-09 MED ORDER — ONDANSETRON HCL 4 MG/2ML IJ SOLN
INTRAMUSCULAR | Status: DC | PRN
  Administered 2021-03-09: 4 mg via INTRAVENOUS

## 2021-03-09 MED ORDER — MIDAZOLAM HCL 5 MG/5ML IJ SOLN
INTRAMUSCULAR | Status: DC | PRN
  Administered 2021-03-09: 2 mg via INTRAVENOUS

## 2021-03-09 MED ORDER — ORAL CARE MOUTH RINSE: 15.0000 mL | Freq: Once | OROMUCOSAL | Status: AC

## 2021-03-09 MED ORDER — PHENYLEPHRINE HCL-NACL 20-0.9 MG/250ML-% IV SOLN
INTRAVENOUS | Status: DC | PRN
  Administered 2021-03-09: 25 ug/min via INTRAVENOUS

## 2021-03-09 MED ORDER — FENTANYL CITRATE (PF) 250 MCG/5ML IJ SOLN
INTRAMUSCULAR | Status: AC
  Filled 2021-03-09: qty 5

## 2021-03-09 MED ORDER — LACTATED RINGERS IV SOLN: INTRAVENOUS | Status: DC

## 2021-03-09 MED ORDER — 0.9 % SODIUM CHLORIDE (POUR BTL) OPTIME
TOPICAL | Status: DC | PRN
  Administered 2021-03-09: 1000 mL

## 2021-03-09 MED ORDER — ONDANSETRON HCL 4 MG/2ML IJ SOLN
4.0000 mg | Freq: Once | INTRAMUSCULAR | Status: AC
  Administered 2021-03-09: 4 mg via INTRAVENOUS
  Filled 2021-03-09: qty 2

## 2021-03-09 MED ORDER — PHENYLEPHRINE HCL (PRESSORS) 10 MG/ML IV SOLN
INTRAVENOUS | Status: DC | PRN
  Administered 2021-03-09: 80 ug via INTRAVENOUS

## 2021-03-09 MED ORDER — CEFAZOLIN SODIUM-DEXTROSE 1-4 GM/50ML-% IV SOLN
1.0000 g | Freq: Once | INTRAVENOUS | Status: AC
Start: 2021-03-09 — End: 2021-03-09
  Administered 2021-03-09: 1 g via INTRAVENOUS
  Filled 2021-03-09: qty 50

## 2021-03-09 MED ORDER — CHLORHEXIDINE GLUCONATE 0.12 % MT SOLN: 15.0000 mL | Freq: Once | OROMUCOSAL | Status: AC

## 2021-03-09 MED ORDER — MIDAZOLAM HCL 2 MG/2ML IJ SOLN
INTRAMUSCULAR | Status: AC
  Filled 2021-03-09: qty 2

## 2021-03-09 MED ORDER — DEXAMETHASONE SODIUM PHOSPHATE 10 MG/ML IJ SOLN
INTRAMUSCULAR | Status: AC
  Filled 2021-03-09: qty 1

## 2021-03-09 MED ORDER — DEXAMETHASONE SODIUM PHOSPHATE 10 MG/ML IJ SOLN
INTRAMUSCULAR | Status: DC | PRN
  Administered 2021-03-09: 5 mg via INTRAVENOUS

## 2021-03-09 MED ORDER — ACETAMINOPHEN 10 MG/ML IV SOLN: 1000.0000 mg | Freq: Once | INTRAVENOUS | Status: DC | PRN

## 2021-03-09 MED ORDER — BUPIVACAINE HCL (PF) 0.25 % IJ SOLN
INTRAMUSCULAR | Status: AC
  Filled 2021-03-09: qty 30

## 2021-03-09 MED ORDER — FENTANYL CITRATE (PF) 100 MCG/2ML IJ SOLN
INTRAMUSCULAR | Status: DC | PRN
  Administered 2021-03-09: 50 ug via INTRAVENOUS

## 2021-03-09 SURGICAL SUPPLY — 60 items
ADAPTER CATH SYR TO TUBING 38M (ADAPTER) ×2 IMPLANT
ADPR CATH LL SYR 3/32 TPR (ADAPTER) ×1
BAG COUNTER SPONGE SURGICOUNT (BAG) ×2 IMPLANT
BAG SPNG CNTER NS LX DISP (BAG) ×1
BNDG CMPR 9X4 STRL LF SNTH (GAUZE/BANDAGES/DRESSINGS)
BNDG COHESIVE 2X5 TAN STRL LF (GAUZE/BANDAGES/DRESSINGS) IMPLANT
BNDG ELASTIC 3X5.8 VLCR STR LF (GAUZE/BANDAGES/DRESSINGS) ×2 IMPLANT
BNDG ELASTIC 4X5.8 VLCR STR LF (GAUZE/BANDAGES/DRESSINGS) ×2 IMPLANT
BNDG ESMARK 4X9 LF (GAUZE/BANDAGES/DRESSINGS) IMPLANT
BNDG GAUZE ELAST 4 BULKY (GAUZE/BANDAGES/DRESSINGS) ×2 IMPLANT
CANNULA VESSEL 3MM 2 BLNT TIP (CANNULA) ×1 IMPLANT
CORD BIPOLAR FORCEPS 12FT (ELECTRODE) ×2 IMPLANT
COVER SURGICAL LIGHT HANDLE (MISCELLANEOUS) ×2 IMPLANT
CUFF TOURN SGL QUICK 18X4 (TOURNIQUET CUFF) IMPLANT
CUFF TOURN SGL QUICK 24 (TOURNIQUET CUFF)
CUFF TRNQT CYL 24X4X16.5-23 (TOURNIQUET CUFF) IMPLANT
DECANTER SPIKE VIAL GLASS SM (MISCELLANEOUS) ×2 IMPLANT
DRAIN PENROSE 1/4X12 LTX STRL (WOUND CARE) IMPLANT
DRSG PAD ABDOMINAL 8X10 ST (GAUZE/BANDAGES/DRESSINGS) ×4 IMPLANT
GAUZE PACKING IODOFORM 1/4X15 (PACKING) ×1 IMPLANT
GAUZE SPONGE 4X4 12PLY STRL (GAUZE/BANDAGES/DRESSINGS) ×2 IMPLANT
GAUZE XEROFORM 1X8 LF (GAUZE/BANDAGES/DRESSINGS) ×2 IMPLANT
GLOVE SRG 8 PF TXTR STRL LF DI (GLOVE) ×1 IMPLANT
GLOVE SURG ENC MOIS LTX SZ7.5 (GLOVE) ×2 IMPLANT
GLOVE SURG ORTHO LTX SZ8 (GLOVE) IMPLANT
GLOVE SURG POLY ORTHO LF SZ8 (GLOVE) IMPLANT
GLOVE SURG UNDER POLY LF SZ8 (GLOVE) ×2
GLOVE SURG UNDER POLY LF SZ8.5 (GLOVE) IMPLANT
GOWN STRL REUS W/ TWL LRG LVL3 (GOWN DISPOSABLE) ×1 IMPLANT
GOWN STRL REUS W/ TWL XL LVL3 (GOWN DISPOSABLE) ×1 IMPLANT
GOWN STRL REUS W/TWL LRG LVL3 (GOWN DISPOSABLE) ×2
GOWN STRL REUS W/TWL XL LVL3 (GOWN DISPOSABLE) ×2
KIT BASIN OR (CUSTOM PROCEDURE TRAY) ×2 IMPLANT
KIT TURNOVER KIT B (KITS) ×2 IMPLANT
LOOP VESSEL MAXI BLUE (MISCELLANEOUS) IMPLANT
MANIFOLD NEPTUNE II (INSTRUMENTS) IMPLANT
NDL HYPO 25X1 1.5 SAFETY (NEEDLE) IMPLANT
NEEDLE HYPO 25X1 1.5 SAFETY (NEEDLE) IMPLANT
NS IRRIG 1000ML POUR BTL (IV SOLUTION) ×2 IMPLANT
PACK ORTHO EXTREMITY (CUSTOM PROCEDURE TRAY) ×2 IMPLANT
PAD ARMBOARD 7.5X6 YLW CONV (MISCELLANEOUS) ×4 IMPLANT
PAD CAST 4YDX4 CTTN HI CHSV (CAST SUPPLIES) IMPLANT
PADDING CAST COTTON 4X4 STRL (CAST SUPPLIES) ×2
SET CYSTO W/LG BORE CLAMP LF (SET/KITS/TRAYS/PACK) ×1 IMPLANT
SOL PREP POV-IOD 4OZ 10% (MISCELLANEOUS) ×4 IMPLANT
SPLINT PLASTER EXTRA FAST 3X15 (CAST SUPPLIES) ×1
SPLINT PLASTER GYPS XFAST 3X15 (CAST SUPPLIES) IMPLANT
SPONGE T-LAP 4X18 ~~LOC~~+RFID (SPONGE) ×2 IMPLANT
SUT ETHILON 4 0 P 3 18 (SUTURE) IMPLANT
SUT ETHILON 4 0 PS 2 18 (SUTURE) IMPLANT
SUT MON AB 5-0 P3 18 (SUTURE) IMPLANT
SWAB COLLECTION DEVICE MRSA (MISCELLANEOUS) IMPLANT
SWAB CULTURE ESWAB REG 1ML (MISCELLANEOUS) ×1 IMPLANT
SYR 20ML LL LF (SYRINGE) ×2 IMPLANT
SYR CONTROL 10ML LL (SYRINGE) IMPLANT
TOWEL GREEN STERILE (TOWEL DISPOSABLE) ×2 IMPLANT
TUBE CONNECTING 12X1/4 (SUCTIONS) ×2 IMPLANT
TUBE FEEDING ENTERAL 5FR 16IN (TUBING) IMPLANT
UNDERPAD 30X36 HEAVY ABSORB (UNDERPADS AND DIAPERS) ×2 IMPLANT
YANKAUER SUCT BULB TIP NO VENT (SUCTIONS) ×2 IMPLANT

## 2021-03-09 NOTE — ED Notes (Addendum)
Provider notified regarding critical Lactic Acid result

## 2021-03-09 NOTE — H&P (Addendum)
Connie Vaughn is an 45 y.o. female.   Chief Complaint: finger infection HPI: 45 yo female with 3 weeks of worsening issues right ring finger.  States she hits finger a lot at work.  Finger especially worse today with increased swelling and pain.  No fevers or chills.  Had I&D of paronychia at UC four days ago with no purulent expression.  Per note from 03/05/21, diffuse swelling of the finger at that time.  Xrays viewed and interpreted by me: 3 views right hand show no fractures, dislocations, radioopaque foreign bodies.  Labs reviewed: WBC 9.8  Allergies:  Allergies  Allergen Reactions   Sulfa Antibiotics Hives    Past Medical History:  Diagnosis Date   ADHD (attention deficit hyperactivity disorder)    Anemia    Diabetes mellitus type 1, uncontrolled (HCC)    GERD (gastroesophageal reflux disease)    Incontinence, feces    PTSD (post-traumatic stress disorder)     Past Surgical History:  Procedure Laterality Date   TUBAL LIGATION      Family History: Family History  Problem Relation Age of Onset   Heart disease Mother    Congestive Heart Failure Mother    Congestive Heart Failure Sister    Diabetes Maternal Grandmother     Social History:   reports that she has been smoking cigarettes. She has a 0.50 pack-year smoking history. She has never used smokeless tobacco. She reports current drug use. Drug: Marijuana. She reports that she does not drink alcohol.  Medications: Medications Prior to Admission  Medication Sig Dispense Refill   D3-50 1.25 MG (50000 UT) capsule Take 50,000 Units by mouth 3 (three) times a week.     doxycycline (MONODOX) 100 MG capsule Take 100 mg by mouth 2 (two) times daily.     insulin aspart (NOVOLOG) 100 UNIT/ML FlexPen Inject 10-12 Units into the skin 3 (three) times daily with meals. (Patient taking differently: Inject 12-15 Units into the skin 3 (three) times daily with meals. Sliding scale) 15 mL 11   insulin degludec (TRESIBA FLEXTOUCH)  100 UNIT/ML FlexTouch Pen Inject 18 Units into the skin at bedtime.     glucose blood test strip Use as instructed 100 each 12   Insulin Pen Needle (B-D UF III MINI PEN NEEDLES) 31G X 5 MM MISC Use as instructed 100 each 2   Lancets MISC by Does not apply route.      Results for orders placed or performed during the hospital encounter of 03/09/21 (from the past 48 hour(s))  CBG monitoring, ED     Status: Abnormal   Collection Time: 03/09/21  6:15 AM  Result Value Ref Range   Glucose-Capillary 300 (H) 70 - 99 mg/dL    Comment: Glucose reference range applies only to samples taken after fasting for at least 8 hours.  Lactic acid, plasma     Status: Abnormal   Collection Time: 03/09/21  6:16 AM  Result Value Ref Range   Lactic Acid, Venous 2.2 (HH) 0.5 - 1.9 mmol/L    Comment: CRITICAL RESULT CALLED TO, READ BACK BY AND VERIFIED WITH: Daune Perch RN BY SSTEPHENS [phone] Performed at [facility], [address], [place], [place] [address]   Comprehensive metabolic panel     Status: Abnormal   Collection Time: 03/09/21  6:16 AM  Result Value Ref Range   Sodium 137 135 - 145 mmol/L   Potassium 4.4 3.5 - 5.1 mmol/L   Chloride 100 98 - 111  mmol/L   CO2 27 22 - 32 mmol/L   Glucose, Bld 307 (H) 70 - 99 mg/dL    Comment: Glucose reference range applies only to samples taken after fasting for at least 8 hours.   BUN 25 (H) 6 - 20 mg/dL   Creatinine, Ser 7.16 0.44 - 1.00 mg/dL   Calcium 9.3 8.9 - 96.7 mg/dL   Total Protein 6.8 6.5 - 8.1 g/dL   Albumin 3.4 (L) 3.5 - 5.0 g/dL   AST 26 15 - 41 U/L   ALT 22 0 - 44 U/L   Alkaline Phosphatase 113 38 - 126 U/L   Total Bilirubin 0.5 0.3 - 1.2 mg/dL   GFR, Estimated >89 >38 mL/min    Comment: (NOTE) Calculated using the CKD-EPI Creatinine Equation (2021)    Anion gap 10 5 - 15    Comment: Performed at [facility], [address] [place], [place] [address]  CBC WITH DIFFERENTIAL     Status: None   Collection Time: 03/09/21  6:16  AM  Result Value Ref Range   WBC 9.8 4.0 - 10.5 K/uL   RBC 4.01 3.87 - 5.11 MIL/uL   Hemoglobin 12.3 12.0 - 15.0 g/dL   HCT 10.2 58.5 - 27.7 %   MCV 91.8 80.0 - 100.0 fL   MCH 30.7 26.0 - 34.0 pg   MCHC 33.4 30.0 - 36.0 g/dL   RDW 82.4 23.5 - 36.1 %   Platelets 388 150 - 400 K/uL   nRBC 0.0 0.0 - 0.2 %   Neutrophils Relative % 70 %   Neutro Abs 6.9 1.7 - 7.7 K/uL   Lymphocytes Relative 20 %   Lymphs Abs 1.9 0.7 - 4.0 K/uL   Monocytes Relative 7 %   Monocytes Absolute 0.7 0.1 - 1.0 K/uL   Eosinophils Relative 2 %   Eosinophils Absolute 0.2 0.0 - 0.5 K/uL   Basophils Relative 1 %   Basophils Absolute 0.1 0.0 - 0.1 K/uL   Immature Granulocytes 0 %   Abs Immature Granulocytes 0.04 0.00 - 0.07 K/uL    Comment: Performed at [facility], [address], [place], [place] [address]  Protime-INR     Status: None   Collection Time: 03/09/21  6:16 AM  Result Value Ref Range   Prothrombin Time 11.5 11.4 - 15.2 seconds   INR 0.8 0.8 - 1.2    Comment: (NOTE) INR goal varies based on device and disease states. Performed at [facility], [address] [place], [place] [address]   APTT     Status: None   Collection Time: 03/09/21  6:16 AM  Result Value Ref Range   aPTT 25 24 - 36 seconds    Comment: Performed at [facility], [address] [place], [place] [address]  I-Stat beta hCG blood, ED     Status: None   Collection Time: 03/09/21  6:24 AM  Result Value Ref Range   I-stat hCG, quantitative <5.0 <5 mIU/mL   Comment 3            Comment:   GEST. AGE      CONC.  (mIU/mL)   <=1 WEEK        5 - 50     2 WEEKS       50 - 500     3 WEEKS       100 - 10,000     4 WEEKS  1,000 - 30,000        FEMALE AND NON-PREGNANT FEMALE:     LESS THAN 5 mIU/mL   SARS Coronavirus 2 by RT PCR (hospital order, performed in [facility] hospital lab)     Status: None   Collection Time: 03/09/21  7:57 AM  Result Value Ref Range   SARS Coronavirus 2 NEGATIVE NEGATIVE     Comment: (NOTE) SARS-CoV-2 target nucleic acids are NOT DETECTED.  The SARS-CoV-2 RNA is generally detectable in upper and lower respiratory specimens during the acute phase of infection. The lowest concentration of SARS-CoV-2 viral copies this assay can detect is 250 copies / mL. A negative result does not preclude SARS-CoV-2 infection and should not be used as the sole basis for treatment or other patient management decisions.  A negative result may occur with improper specimen collection / handling, submission of specimen other than nasopharyngeal swab, presence of viral mutation(s) within the areas targeted by this assay, and inadequate number of viral copies (<250 copies / mL). A negative result must be combined with clinical observations, patient history, and epidemiological information.  Fact Sheet for Patients:   BoilerBrush.com.cyhttps://www.fda.gov/media/136312/download  Fact Sheet for Healthcare Providers: https://pope.com/https://www.fda.gov/media/136313/download  This test is not yet approved or  cleared by the [place] FDA and has been authorized for detection and/or diagnosis of SARS-CoV-2 by FDA under an Emergency Use Authorization (EUA).  This EUA will remain in effect (meaning this test can be used) for the duration of the COVID-19 declaration under Section 564(b)(1) of the Act, 21 U.S.C. section 360bbb-3(b)(1), unless the authorization is terminated or revoked sooner.  Performed at [facility], [address] [place], [place] [address]   Lactic acid, plasma     Status: Abnormal   Collection Time: 03/09/21  8:09 AM  Result Value Ref Range   Lactic Acid, Venous 2.7 (HH) 0.5 - 1.9 mmol/L    Comment: CRITICAL VALUE NOTED.  VALUE IS CONSISTENT WITH PREVIOUSLY REPORTED AND CALLED VALUE. Performed at [facility], [address] [place], [place] [address]   POC CBG, ED     Status: Abnormal   Collection Time: 03/09/21 11:27 AM  Result Value Ref Range   Glucose-Capillary 60 (L)  70 - 99 mg/dL    Comment: Glucose reference range applies only to samples taken after fasting for at least 8 hours.  POC CBG, ED     Status: Abnormal   Collection Time: 03/09/21 12:59 PM  Result Value Ref Range   Glucose-Capillary 61 (L) 70 - 99 mg/dL    Comment: Glucose reference range applies only to samples taken after fasting for at least 8 hours.  POC CBG, ED     Status: Abnormal   Collection Time: 03/09/21  2:31 PM  Result Value Ref Range   Glucose-Capillary 154 (H) 70 - 99 mg/dL    Comment: Glucose reference range applies only to samples taken after fasting for at least 8 hours.  Glucose, capillary     Status: Abnormal   Collection Time: 03/09/21  3:49 PM  Result Value Ref Range   Glucose-Capillary 189 (H) 70 - 99 mg/dL    Comment: Glucose reference range applies only to samples taken after fasting for at least 8 hours.    DG Chest 1 View  Result Date: 03/09/2021 CLINICAL DATA:  45 year old female with chest pain. EXAM: CHEST  1 VIEW COMPARISON:  Portable chest 10/06/2020. FINDINGS: Portable AP upright view at 0901 hours. Lung volumes and mediastinal contours  remain normal. Visualized tracheal air column is within normal limits. No acute osseous abnormality identified. Chronic dextroconvex thoracic scoliosis. Paucity of bowel gas. IMPRESSION: Negative portable chest. Electronically Signed   By: Odessa Fleming M.D.   On: 03/09/2021 09:37   DG Hand Complete Right  Result Date: 03/09/2021 CLINICAL DATA:  45 year old female with right hand cellulitis following crush injury of the 4th finger at work 3 weeks ago. No improvement with antibiotics. EXAM: RIGHT HAND - COMPLETE 3+ VIEW COMPARISON:  Right 4th finger series 03/05/2021. FINDINGS: 3 portable views of the right hand. Soft tissue swelling about the right 4th finger, especially the ulnar and dorsal aspect. No soft tissue gas. Underlying phalanges appear intact and normally aligned. Normal joint spaces. Negative elsewhere, no acute osseous  abnormality identified. IMPRESSION: Soft tissue swelling with no osseous abnormality identified in the right hand. Electronically Signed   By: Odessa Fleming M.D.   On: 03/09/2021 09:38      Blood pressure (!) 176/95, pulse 99, temperature 97.9 F (36.6 C), temperature source Oral, resp. rate 18, height 5\' 4"  (1.626 m), weight 57.6 kg, last menstrual period 02/26/2021, SpO2 99 %.  General appearance: alert, cooperative, and appears stated age Head: Normocephalic, without obvious abnormality, atraumatic Neck: supple, symmetrical, trachea midline Resp: clear to auscultation bilaterally Cardio: regular rate and rhythm Extremities: Intact sensation and capillary refill all digits.  +epl/fpl/io.  Scabbed wound at dorsal nail fold ring finger.  Ring finger markedly swollen.  Tender to palpation volarly and dorsally.  Pain with any motion of finger.  Tender along flexor sheath.  Tender at mp, pip, dip joints. Pulses: 2+ and symmetric Skin: Skin color, texture, turgor normal. No rashes or lesions Neurologic: Grossly normal Incision/Wound: as above  Assessment/Plan Right ring finger infection.  Concerning for flexor sheath infection.  Also concern of mp, pip, dip joint infection given tenderness dorsally at joints and pain with any motion of joints.  Patient is type I diabetic.  Recommend incision and drainage right ring finger in OR including flexor sheath and possibly mp, pip, dip joints.  Risks, benefits and alternatives of surgery were discussed including risks of blood loss, infection, damage to nerves/vessels/tendons/ligament/bone, failure of surgery, need for additional surgery, complication with wound healing, stiffness, need for repeat irrigation and debridement.  She voiced understanding of these risks and elected to proceed.    02/28/2021 03/09/2021, 5:28 PM

## 2021-03-09 NOTE — Progress Notes (Signed)
03/09/2021 @ 1900 RN Leslie Dales called Dr. Shona Simpson to inform of patients blood sugar value of 317. Dr, Hart Rochester ordered 10 units SQ Novolog insulin to be given. Leslie Dales administered insulin as ordered. Upon rechecking pt's blood sugar at 1930 she was noted to have a result of 352. Leslie Dales, RN contacted Dr. Hart Rochester again and received a another  order for  a second one time dose of 10units Novolog insulin. Teodoro Kil administered  as ordered. Dr, Hart Rochester stated that if the third check of her blood sugar showed a downward trend that she would be approved for discharge at that time because the patient should know how to manage her diabetes at home. This is a chronis issue for her and he is aware and feels safe sending her home as long as her blood sugar trends down. 09/06/2020 @ 1935 Manson Allan, RN

## 2021-03-09 NOTE — Anesthesia Procedure Notes (Signed)
Procedure Name: Intubation Date/Time: 03/09/2021 5:49 PM Performed by: Clearnce Sorrel, CRNA Pre-anesthesia Checklist: Patient identified, Emergency Drugs available, Suction available and Patient being monitored Patient Re-evaluated:Patient Re-evaluated prior to induction Oxygen Delivery Method: Circle System Utilized Preoxygenation: Pre-oxygenation with 100% oxygen Induction Type: IV induction Ventilation: Mask ventilation without difficulty Laryngoscope Size: Mac and 3 Grade View: Grade I Tube type: Oral Tube size: 7.0 mm Number of attempts: 1 Airway Equipment and Method: Stylet and Oral airway Placement Confirmation: ETT inserted through vocal cords under direct vision, positive ETCO2 and breath sounds checked- equal and bilateral Secured at: 22 cm Tube secured with: Tape Dental Injury: Teeth and Oropharynx as per pre-operative assessment

## 2021-03-09 NOTE — ED Notes (Signed)
Nurse report given to [facility], [facility] fund[facility].

## 2021-03-09 NOTE — Discharge Instructions (Signed)

## 2021-03-09 NOTE — ED Triage Notes (Addendum)
Pt c/o infection to ring finger on the R hand x several weeks. Finger noted to be discolored and limited motion. Pt is type 1 diabetic, has been seen at [facility], given PO antibiotic and inject. Reports worsening infection from finger into hand, numbness reported up to elbow. Pt has had uncontrolled blood sugars at home

## 2021-03-09 NOTE — Op Note (Addendum)
NAME: Connie Vaughn MEDICAL RECORD NO: [id number] DATE OF BIRTH: 07/19/1975 FACILITY: [facility] LOCATION: [facility] OR PHYSICIAN: Tami Ribas, MD   OPERATIVE REPORT   DATE OF PROCEDURE: 03/09/21    PREOPERATIVE DIAGNOSIS: Right ring finger infection possible flexor sheath infection possible MP/PIP/DIP joint infection   POSTOPERATIVE DIAGNOSIS: Right ring finger flexor sheath infection, possible MP/PIP/DIP joint infection   PROCEDURE: 1.  Incision and drainage right ring finger flexor sheath 2.  Incision and drainage right ring finger MP joint 3.  Incision drainage right ring finger PIP joint 4.  Incision drainage right ring finger DIP joint   SURGEON:  Betha Loa, M.D.   ASSISTANT: none   ANESTHESIA:  General   INTRAVENOUS FLUIDS:  Per anesthesia flow sheet.   ESTIMATED BLOOD LOSS:  Minimal.   COMPLICATIONS:  None.   SPECIMENS: Cultures to micro   TOURNIQUET TIME:    Total Tourniquet Time Documented: Upper Arm (Right) - 42 minutes Total: Upper Arm (Right) - 42 minutes    DISPOSITION:  Stable to PACU.   INDICATIONS: 45 year old female has had 3 weeks of pain of the right ring finger.  She does not remember specific injuries but states she bumped her finger a lot at work.  She was seen 4 days ago at urgent care where a I&D was performed of possible paronychia.  She has had continued and increasing swelling and pain of the finger.  She presented to the emergency department today.  I recommended incision and drainage in the operating room including the flexor sheath and possibly MP PIP and DIP joints due to significant pain to palpation dorsally at the joints.  Risks, benefits and alternatives of surgery were discussed including the risks of blood loss, infection, damage to nerves, vessels, tendons, ligaments, bone for surgery, need for additional surgery, complications with wound healing, continued pain, stiffness, need for repeat irrigation and debridement.  She voiced  understanding of these risks and elected to proceed.  OPERATIVE COURSE:  After being identified preoperatively by myself,  the patient and I agreed on the procedure and site of the procedure.  The surgical site was marked.  Surgical consent had been signed. She was given IV antibiotics as preoperative antibiotic prophylaxis. She was transferred to the operating room and placed on the operating table in supine position with the Right upper extremity on an arm board.  General anesthesia was induced by the anesthesiologist.  Right upper extremity was prepped and draped in normal sterile orthopedic fashion.  A surgical pause was performed between the surgeons, anesthesia, and operating room staff and all were in agreement as to the patient, procedure, and site of procedure.  Tourniquet at the proximal aspect of the extremity was inflated to 250 mmHg after exsanguination of the arm with an Esmarch bandage.  The nail and nail fold were inspected.  There was swelling at the dorsal ulnar aspect of the nail fold.  The Glorious Peach was used to elevate the dorsal nail fold.  No purulence was encountered.  The nail was removed.  There was inflammation of the nailbed at the ulnar side.  Incision was made at the volar pad of the ring finger.  The tendon sheath was opened.  An incision was made at the volar aspect of the MP joint.  This is carried in subcutaneous tissues by spreading technique.  Bipolar electrocautery is used to obtain hemostasis.  The tendon sheath was opened releasing a portion of the A1 pulley.  There was purulence within the  tendon sheath.  Cultures were taken for aerobes and anaerobes.  Incision was made at the dorsal ulnar aspect of the DIP joint.  The DIP joint was entered underneath the extensor tendon.  There was no purulence within the joint.  The swollen area at the dorsal nail fold was able to be opened through this wound as well.  Incision was made at the dorsal ulnar aspect of the PIP joint.  The PIP  joint was opened between the lateral band and central slip.  Again there was no purulence within the joint.  An incision was made at the dorsal ulnar aspect of the MP joint.  An incision was made in the sagittal band at the ulnar side.  The MP joint was entered.  There is no purulence within the joint.  A small portion of the capsule was removed to allow adequate drainage.  A vessel cannula was used to copiously irrigate the MP PIP and DIP joints.  A #5 pediatric feeding tube was placed into the flexor sheath from the proximal wound.  This was used to irrigate the flexor sheath.  Good E-point was obtained from the proximal and distal wounds.  Initially a stream of fluid came not through the distal wound but from the ulnar nail gutter.  It was felt that this was the likely source of the infection traveling from the paronychia to the flexor sheath.  The flexor sheath was copiously irrigated with sterile saline with good effluent obtained from both the proximal and distal wounds.  All wounds were then copiously irrigated with sterile saline including the MP PIP and DIP joints again.  Quarter-inch iodoform packing was used.  Shonna Chock was placed into the MP PIP and DIP joints and all wounds packed with the iodoform gauze.  The pediatric feeding tube was removed from the flexor sheath.  Digital block was performed with quarter percent plain Marcaine to aid in postoperative analgesia.  A piece of Xeroform was placed in the nail fold and the wound dressed with sterile Xeroform.  All other wounds were dressed with sterile 4 x 4's and wrapped with a Kerlix bandage.  A volar splint was placed and wrapped with Kerlix and Ace bandage.  The tourniquet was deflated at 42 minutes.  Fingertips were pink with brisk capillary refill after deflation of tourniquet.  The operative  drapes were broken down.  The patient was awoken from anesthesia safely.  She was transferred back to the stretcher and taken to PACU in stable condition.  I will  see her back in the office in 3-4 days for postoperative followup.  I will give her a prescription for Percocet 5/325 1-2 tabs PO q6 hours prn pain, dispense # 20 and doxycycline 100 mg p.o. twice daily x10 days .  Debridement type: Excisional Debridement  Side: right  Body Location: ring finger   Tools used for debridement: scissors  Debridement depth beyond dead/damaged tissue down to healthy viable tissue: yes  Tissue layer involved: skin, subcutaneous tissue, muscle / fascia  Nature of tissue removed: Purulence  Irrigation volume: 1000     Irrigation fluid type: Normal Saline   Betha Loa, MD Electronically signed, 03/09/21

## 2021-03-09 NOTE — ED Provider Notes (Signed)
Emergency Medicine Provider Triage Evaluation Note  Connie Vaughn , a 45 y.o. female  was evaluated in triage.  Pt complains of worsening infection of the right ring finger for several weeks.  Reports she struck it at work and noticed some bleeding but did not think much about it.  Reports she is a type I diabetic.  She was seen at [facility] last week and given doxycycline.  Reports things began to get worse and she was seen at [facility] urgent care several days ago, told to continue the doxycycline and was given an injection.  Reports that time they attempted to I&D the infection but no purulent drainage was obtained.  Patient reports pain and numbness from the tip of the finger to her right elbow.  Patient reports her blood sugars at home have been reading high despite taking her insulin.  She reports severe pain in the hand along with swelling and changing colors.  Review of Systems  Positive: Right hand pain, high blood sugars Negative: Fever, chills  Physical Exam  BP (!) 162/96 (BP Location: Right Arm)   Pulse (!) 101   Temp 98.2 F (36.8 C) (Oral)   Resp 16   LMP 02/26/2021   SpO2 99%  Gen:   Awake, uncomfortable Resp:  Normal effort  MSK:   Swelling, erythema and ecchymosis to the right ring finger extending down into the dorsum and palmar portion of the right hand.  Significantly decreased range of motion. Other:  Blood sugar 300  Medical Decision Making  Medically screening exam initiated at 6:11 AM.  Appropriate orders placed.  Rudene Anda was informed that the remainder of the evaluation will be completed by another provider, this initial triage assessment does not replace that evaluation, and the importance of remaining in the ED until their evaluation is complete.  Type I diabetic with right hand infection.  Concern for possible sepsis.  Sepsis screen initiated.  Suspect patient will need admission.   Gweneth Fredlund, Boyd Kerbs 03/09/21 [id number]    Gilda Crease, MD 03/09/21 [phone]

## 2021-03-09 NOTE — ED Provider Notes (Signed)
[facility] Vaughn [facility] EMERGENCY DEPARTMENT Provider Note   CSN: [id number] Arrival date & time: 03/09/21  0549     History Chief Complaint  Patient presents with   Finger Injury    Connie Vaughn is a 45 y.o. female with concern for pain, swelling, redness, warmth to touch, indication for imaging of her right hand.  Initially started with infection of the right ring finger for 3 weeks ago, after she struck it at work (works in [employer]) who states that it bled a small mount of the time she can think about it.  She was later seen at [facility] last week and started on oral doxycycline, however redness, swelling, and pain have extended up both the dorsum and volar aspects of the right hand with paresthesias shooting up to the right elbow.  There is attempted I&D of paronychia of the right ring finger with very scant purulent output.  She was also administered a dose of Rocephin IM at urgent care.  Presents today with concern for worsening pain and extension of swelling of the back of the hand.  She continues to endorse compliance with doxycycline at home, but endorses nausea associated with severity of pain.  Denies any fevers or chills at home, denies any vomiting.  I personally read this patient's medical records.  She is a type I diabetic and endorses hyperglycemia with blood sugars around 300 at home the last few days despite normal usage of her insulin. Per chart review, Tdap UTD, administered in 2019.   HPI     Past Medical History:  Diagnosis Date   ADHD (attention deficit hyperactivity disorder)    Anemia    Diabetes mellitus type 1, uncontrolled (HCC)    GERD (gastroesophageal reflux disease)    Incontinence, feces    PTSD (post-traumatic stress disorder)     Patient Active Problem List   Diagnosis Date Noted   DKA (diabetic ketoacidosis) (HCC) 10/06/2020   AKI (acute kidney injury) (HCC) 10/06/2020   HLD (hyperlipidemia) 10/06/2020   DKA, type  1 (HCC) 10/16/2017   Diabetic ketoacidosis without coma associated with type 1 diabetes mellitus (HCC)     Past Surgical History:  Procedure Laterality Date   TUBAL LIGATION       OB History   No obstetric history on file.     Family History  Problem Relation Age of Onset   Heart disease Mother    Congestive Heart Failure Mother    Congestive Heart Failure Sister    Diabetes Maternal Grandmother     Social History   Tobacco Use   Smoking status: Some Days    Packs/day: 0.25    Years: 2.00    Pack years: 0.50    Types: Cigarettes   Smokeless tobacco: Never   Tobacco comments:    also uses Black and Mild  Vaping Use   Vaping Use: Never used  Substance Use Topics   Alcohol use: Never   Drug use: Yes    Types: Marijuana    Comment: edibles    Home Medications Prior to Admission medications   Medication Sig Start Date End Date Taking? Authorizing Provider  D3-50 1.25 MG (50000 UT) capsule Take 50,000 Units by mouth 3 (three) times a week. 03/06/21  Yes [provider]  doxycycline (MONODOX) 100 MG capsule Take 100 mg by mouth 2 (two) times daily.   Yes [provider]  insulin aspart (NOVOLOG) 100 UNIT/ML FlexPen Inject 10-12 Units into the skin  3 (three) times daily with meals. Patient taking differently: Inject 12-15 Units into the skin 3 (three) times daily with meals. Sliding scale 11/18/17  Yes Claiborne RiggFleming, Zelda W, NP  insulin degludec (TRESIBA FLEXTOUCH) 100 UNIT/ML FlexTouch Pen Inject 18 Units into the skin at bedtime. 08/22/19  Yes [provider]  glucose blood test strip Use as instructed 11/18/17   Claiborne RiggFleming, Zelda W, NP  Insulin Pen Needle (B-D UF III MINI PEN NEEDLES) 31G X 5 MM MISC Use as instructed 11/18/17   Claiborne RiggFleming, Zelda W, NP  Lancets MISC by Does not apply route. 07/09/13   [provider]    Allergies    Sulfa antibiotics  Review of Systems   Review of Systems  Constitutional: Negative.   HENT: Negative.     Respiratory: Negative.    Cardiovascular: Negative.   Gastrointestinal:  Positive for nausea. Negative for abdominal pain, blood in stool, constipation, diarrhea and vomiting.  Genitourinary: Negative.   Musculoskeletal:        Pain, swelling, redness, and warmth to the right hand, primarily in the right ring finger  Skin:  Positive for color change.  Neurological: Negative.    Physical Exam Updated Vital Signs BP (!) 176/95   Pulse 99   Temp 97.9 F (36.6 C) (Oral)   Resp 18   Ht 5\' 4"  (1.626 m)   Wt 57.6 kg   LMP 02/26/2021   SpO2 99%   BMI 21.80 kg/m   Physical Exam Vitals and nursing note reviewed.  Constitutional:      Appearance: She is normal weight. She is not ill-appearing or toxic-appearing.  HENT:     Head: Normocephalic and atraumatic.     Nose: Nose normal. No congestion.     Mouth/Throat:     Mouth: Mucous membranes are moist.     Pharynx: No oropharyngeal exudate or posterior oropharyngeal erythema.  Eyes:     General: Lids are normal. Vision grossly intact.        Right eye: No discharge.        Left eye: No discharge.     Extraocular Movements: Extraocular movements intact.     Conjunctiva/sclera: Conjunctivae normal.     Pupils: Pupils are equal, round, and reactive to light.  Neck:     Trachea: Trachea and phonation normal.  Cardiovascular:     Rate and Rhythm: Regular rhythm. Tachycardia present.     Pulses: Normal pulses.     Heart sounds: Normal heart sounds. No murmur heard.    Comments: HR 105 at time of exam Pulmonary:     Effort: Pulmonary effort is normal. No tachypnea, bradypnea, accessory muscle usage, prolonged expiration or respiratory distress.     Breath sounds: Normal breath sounds. No wheezing or rales.  Chest:     Chest wall: No mass, lacerations, deformity, swelling, tenderness, crepitus or edema.  Abdominal:     General: Bowel sounds are normal. There is no distension.     Palpations: Abdomen is soft.     Tenderness: There  is no abdominal tenderness. There is no right CVA tenderness, left CVA tenderness, guarding or rebound.    Musculoskeletal:        General: No deformity.     Right elbow: Normal.     Left elbow: Normal.     Right forearm: Tenderness present. No swelling, edema, deformity or bony tenderness.     Left forearm: Normal.     Right wrist: Tenderness present. No swelling, deformity, effusion, lacerations,  bony tenderness, snuff box tenderness or crepitus. Normal range of motion. Normal pulse.     Left wrist: Normal.     Right hand: Swelling, tenderness and bony tenderness present. Decreased range of motion. Normal sensation. Normal capillary refill. Normal pulse.     Left hand: Normal.       Hands:     Cervical back: Full passive range of motion without pain, normal range of motion and neck supple. No rigidity, tenderness or crepitus. No pain with movement, spinous process tenderness or muscular tenderness.     Right lower leg: No edema.     Left lower leg: No edema.     Comments: Normal sensation and cap refill in all 5 digits of the right hand.  2+ radial pulse bilaterally.  Some tenderness palpation of the distal forearm and wrist without erythema, swelling, ecchymosis, or induration.  Lymphadenopathy:     Cervical: No cervical adenopathy.  Skin:    General: Skin is warm and dry.     Capillary Refill: Capillary refill takes less than 2 seconds.     Findings: Ecchymosis and erythema present.  Neurological:     General: No focal deficit present.     Mental Status: She is alert and oriented to person, place, and time. Mental status is at baseline.  Psychiatric:        Mood and Affect: Mood normal.           ED Results / Procedures / Treatments   Labs (all labs ordered are listed, but only abnormal results are displayed) Labs Reviewed  LACTIC ACID, PLASMA - Abnormal; Notable for the following components:      Result Value   Lactic Acid, Venous 2.2 (*)    All other components  within normal limits  LACTIC ACID, PLASMA - Abnormal; Notable for the following components:   Lactic Acid, Venous 2.7 (*)    All other components within normal limits  COMPREHENSIVE METABOLIC PANEL - Abnormal; Notable for the following components:   Glucose, Bld 307 (*)    BUN 25 (*)    Albumin 3.4 (*)    All other components within normal limits  GLUCOSE, CAPILLARY - Abnormal; Notable for the following components:   Glucose-Capillary 189 (*)    All other components within normal limits  CBG MONITORING, ED - Abnormal; Notable for the following components:   Glucose-Capillary 300 (*)    All other components within normal limits  CBG MONITORING, ED - Abnormal; Notable for the following components:   Glucose-Capillary 60 (*)    All other components within normal limits  CBG MONITORING, ED - Abnormal; Notable for the following components:   Glucose-Capillary 61 (*)    All other components within normal limits  CBG MONITORING, ED - Abnormal; Notable for the following components:   Glucose-Capillary 154 (*)    All other components within normal limits  SARS CORONAVIRUS 2 (HOSPITAL ORDER, PERFORMED IN [facility])  CULTURE, BLOOD (ROUTINE X 2)  CULTURE, BLOOD (ROUTINE X 2)  CBC WITH DIFFERENTIAL/PLATELET  PROTIME-INR  APTT  URINALYSIS, ROUTINE W REFLEX MICROSCOPIC  I-STAT BETA HCG BLOOD, ED ([facility], [facility], [facility] ONLY)  CBG MONITORING, ED  CBG MONITORING, ED  CBG MONITORING, ED  CBG MONITORING, ED  CBG MONITORING, ED  CBG MONITORING, ED    EKG None  Radiology DG Chest 1 View  Result Date: 03/09/2021 CLINICAL DATA:  45 year old female with chest pain. EXAM: CHEST  1 VIEW COMPARISON:  Portable chest 10/06/2020. FINDINGS:  Portable AP upright view at 0901 hours. Lung volumes and mediastinal contours remain normal. Visualized tracheal air column is within normal limits. No acute osseous abnormality identified. Chronic dextroconvex thoracic scoliosis. Paucity of bowel gas.  IMPRESSION: Negative portable chest. Electronically Signed   By: Odessa Fleming M.D.   On: 03/09/2021 09:37   DG Hand Complete Right  Result Date: 03/09/2021 CLINICAL DATA:  45 year old female with right hand cellulitis following crush injury of the 4th finger at work 3 weeks ago. No improvement with antibiotics. EXAM: RIGHT HAND - COMPLETE 3+ VIEW COMPARISON:  Right 4th finger series 03/05/2021. FINDINGS: 3 portable views of the right hand. Soft tissue swelling about the right 4th finger, especially the ulnar and dorsal aspect. No soft tissue gas. Underlying phalanges appear intact and normally aligned. Normal joint spaces. Negative elsewhere, no acute osseous abnormality identified. IMPRESSION: Soft tissue swelling with no osseous abnormality identified in the right hand. Electronically Signed   By: Odessa Fleming M.D.   On: 03/09/2021 09:38    Procedures Procedures   Medications Ordered in ED Medications  HYDROmorphone (DILAUDID) injection 1 mg ( Intravenous MAR Hold 03/09/21 1554)  lactated ringers infusion (has no administration in time range)  chlorhexidine (PERIDEX) 0.12 % solution 15 mL (has no administration in time range)    Or  MEDLINE mouth rinse (has no administration in time range)  chlorhexidine (PERIDEX) 0.12 % solution (has no administration in time range)  morphine 4 MG/ML injection 4 mg (4 mg Intravenous Given 03/09/21 1015)  ondansetron (ZOFRAN) injection 4 mg (4 mg Intravenous Given 03/09/21 1003)  ceFAZolin (ANCEF) IVPB 1 g/50 mL premix (0 g Intravenous Stopped 03/09/21 1045)  lactated ringers bolus 1,000 mL (0 mLs Intravenous Stopped 03/09/21 1526)  dextrose 50 % solution 50 mL (50 mLs Intravenous Given 03/09/21 1140)  morphine 4 MG/ML injection 4 mg (4 mg Intravenous Given 03/09/21 1230)  dextrose 50 % solution 50 mL (50 mLs Intravenous Given 03/09/21 1348)    ED Course  I have reviewed the triage vital signs and the nursing notes.  Pertinent labs & imaging results that were available  during my care of the patient were reviewed by me and considered in my medical decision making (see chart for details).  Clinical Course as of 03/09/21 1614  Sat Mar 09, 2021  1610 Critical result, lactic acid 2.2. [RS]  1039 Patient ate breakfast at 730 this morning, and this morning with sausage and egg.  6 ounces of water 20 minutes ago.  Case discussed with hand surgeon, Dr. Merlyn Lot, who will plan to take patient to the OR for drainage and washout as she is undergone attempted I&D of paronychia last week.  Agrees with plan for Ancef, patient will likely be able to be discharged home this evening.  Patient does have her husband coming to the bedside later today.  I appreciate his collaboration in the care of this patient. [RS]  1125 RN informed this provider that the patient's CBG by her continuous glucose monitor was <60. CBG obtained by RN, amp of D50 ordered.  [RS]  1217 Patient reevaluated, with persistent pain, more analgesia ordered.  [RS]    Clinical Course User Index [RS] Gurshaan Matsuoka, Idelia Salm   MDM Rules/Calculators/A&P                         45 year old female with swelling and pain to the right hand since being diagnosed with infection 1 week ago.  Differential  diagnosis includes limited to paronychia, felon, cellulitis, erysipelas, osteomyelitis, sepsis.  Hypertensive and mildly tachycardic on intake, vital signs normal.  Cardiopulmonary exam revealed mild tachycardia but was otherwise unremarkable.  Abdominal exam is benign.  Musculoskeletal exam as above with findings concerning for extending cellulitis of the right fourth finger now extending to the hand and wrist.  Basic labs obtained in triage overnight, CBC unremarkable without leukocytosis, patient has been on doxycycline for 1 week.  CMP with hyperglycemia to 307, lactic acid elevated to 2.2.  Repeat lactic elevated to 2.7.  Patient being administered Ancef and IV fluids, case discussed with hand surgeon as above  who will take patient to the OR this afternoon for washout.  She has been n.p.o. since 730 this morning.  Transient episode of hypoglycemia, treated with amp of D50.  Did require second D50 normal.  Appropriately increased her blood glucose.  Netty was understanding with medical evaluation treatment plan thus far.  Each of her questions was answered to her expressed satisfaction.  She is amenable plan for washout with likely discharge home in standing.   Patient transported to the OR for washout.  This chart was dictated using voice recognition software, Dragon. Despite the best efforts of this provider to proofread and correct errors, errors may still occur which can change documentation meaning.   Final Clinical Impression(s) / ED Diagnoses Final diagnoses:  Chest pain    Rx / DC Orders ED Discharge Orders     None        Sherrilee Gilles 03/09/21 1614    Long, Arlyss Repress, MD 03/17/21 1753

## 2021-03-09 NOTE — Anesthesia Postprocedure Evaluation (Signed)
Anesthesia Post Note  Patient: Connie Vaughn  Procedure(s) Performed: IRRIGATION AND DEBRIDEMENT RIGHT RING FINGER (Right: Hand)     Patient location during evaluation: PACU Anesthesia Type: General Level of consciousness: awake and alert Pain management: pain level controlled Vital Signs Assessment: post-procedure vital signs reviewed and stable Respiratory status: spontaneous breathing, nonlabored ventilation, respiratory function stable and patient connected to nasal cannula oxygen Cardiovascular status: blood pressure returned to baseline and stable Postop Assessment: no apparent nausea or vomiting Anesthetic complications: no   No notable events documented.  Last Vitals:  Vitals:   03/09/21 1945 03/09/21 1955  BP: 138/87 123/80  Pulse: 83 84  Resp: 12 14  Temp:  36.8 C  SpO2: 99% 100%    Last Pain:  Vitals:   03/09/21 1955  TempSrc:   PainSc: 0-No pain                 Shelton Silvas

## 2021-03-09 NOTE — Progress Notes (Signed)
09/06/2020 @ 1955 Pt's blood sugar now 276. She is awake. Denies pain or distress, is tolerating Ginger Ale well and states she is "ready to go home".  Agnes Lawrence has notified husband to have him drive up for patient pick up. 03/09/2021 @ 1957 Manson Allan, RN

## 2021-03-09 NOTE — Transfer of Care (Signed)
Immediate Anesthesia Transfer of Care Note  Patient: Connie Vaughn  Procedure(s) Performed: IRRIGATION AND DEBRIDEMENT RIGHT RING FINGER (Right: Hand)  Patient Location: PACU  Anesthesia Type:General  Level of Consciousness: sedated  Airway & Oxygen Therapy: Patient Spontanous Breathing  Post-op Assessment: Report given to RN and Post -op Vital signs reviewed and stable  Post vital signs: Reviewed and stable  Last Vitals:  Vitals Value Taken Time  BP 125/61 03/09/21 1858  Temp    Pulse 81 03/09/21 1900  Resp 10 03/09/21 1900  SpO2 99 % 03/09/21 1900  Vitals shown include unvalidated device data.  Last Pain:  Vitals:   03/09/21 1609  TempSrc: Oral  PainSc: 10-Worst pain ever      Patients Stated Pain Goal: 7 (03/09/21 1609)  Complications: No notable events documented.

## 2021-03-09 NOTE — Anesthesia Preprocedure Evaluation (Signed)
Anesthesia Evaluation  Patient identified by MRN, date of birth, ID band Patient awake    Reviewed: Allergy & Precautions, NPO status , Patient's Chart, lab work & pertinent test results  Airway Mallampati: I  TM Distance: >3 FB Neck ROM: Full    Dental  (+) Teeth Intact, Dental Advisory Given   Pulmonary Current Smoker and Patient abstained from smoking.,    breath sounds clear to auscultation       Cardiovascular  Rhythm:Regular Rate:Normal     Neuro/Psych PSYCHIATRIC DISORDERS Anxiety    GI/Hepatic Neg liver ROS, GERD  ,  Endo/Other  diabetes, Type 1, Insulin Dependent  Renal/GU Renal disease     Musculoskeletal   Abdominal Normal abdominal exam  (+)   Peds  Hematology   Anesthesia Other Findings   Reproductive/Obstetrics                             Anesthesia Physical Anesthesia Plan  ASA: 2 and emergent  Anesthesia Plan: General   Post-op Pain Management:    Induction: Intravenous, Rapid sequence and Cricoid pressure planned  PONV Risk Score and Plan: 3 and Ondansetron, Dexamethasone and Midazolam  Airway Management Planned: Oral ETT  Additional Equipment: None  Intra-op Plan:   Post-operative Plan: Extubation in OR  Informed Consent: I have reviewed the patients History and Physical, chart, labs and discussed the procedure including the risks, benefits and alternatives for the proposed anesthesia with the patient or authorized representative who has indicated his/her understanding and acceptance.     Dental advisory given  Plan Discussed with: CRNA  Anesthesia Plan Comments:         Anesthesia Quick Evaluation

## 2021-03-10 ENCOUNTER — Encounter (HOSPITAL_COMMUNITY): Payer: Self-pay | Admitting: Orthopedic Surgery

## 2021-03-13 ENCOUNTER — Other Ambulatory Visit: Payer: Self-pay

## 2021-03-13 ENCOUNTER — Encounter (INDEPENDENT_AMBULATORY_CARE_PROVIDER_SITE_OTHER): Payer: BC Managed Care – PPO | Admitting: Ophthalmology

## 2021-03-13 DIAGNOSIS — E113312 Type 2 diabetes mellitus with moderate nonproliferative diabetic retinopathy with macular edema, left eye: Secondary | ICD-10-CM

## 2021-03-14 LAB — CULTURE, BLOOD (ROUTINE X 2)
Culture: NO GROWTH
Culture: NO GROWTH
Special Requests: ADEQUATE
Special Requests: ADEQUATE

## 2021-03-15 LAB — AEROBIC/ANAEROBIC CULTURE W GRAM STAIN (SURGICAL/DEEP WOUND)

## 2021-03-22 ENCOUNTER — Encounter (INDEPENDENT_AMBULATORY_CARE_PROVIDER_SITE_OTHER): Payer: Managed Care, Other (non HMO) | Admitting: Ophthalmology

## 2021-03-25 ENCOUNTER — Other Ambulatory Visit: Payer: Self-pay

## 2021-03-25 ENCOUNTER — Encounter (INDEPENDENT_AMBULATORY_CARE_PROVIDER_SITE_OTHER): Payer: BC Managed Care – PPO | Admitting: Ophthalmology

## 2021-03-25 DIAGNOSIS — E113312 Type 2 diabetes mellitus with moderate nonproliferative diabetic retinopathy with macular edema, left eye: Secondary | ICD-10-CM | POA: Diagnosis not present

## 2021-03-27 ENCOUNTER — Other Ambulatory Visit: Payer: Self-pay

## 2021-03-27 ENCOUNTER — Encounter (HOSPITAL_BASED_OUTPATIENT_CLINIC_OR_DEPARTMENT_OTHER): Payer: Self-pay

## 2021-03-27 ENCOUNTER — Emergency Department (HOSPITAL_BASED_OUTPATIENT_CLINIC_OR_DEPARTMENT_OTHER)
Admission: EM | Admit: 2021-03-27 | Discharge: 2021-03-28 | Disposition: A | Discharge status: Home / Self Care | Payer: BC Managed Care – PPO | Attending: Emergency Medicine | Admitting: Emergency Medicine

## 2021-03-27 DIAGNOSIS — R748 Abnormal levels of other serum enzymes: Secondary | ICD-10-CM | POA: Diagnosis not present

## 2021-03-27 DIAGNOSIS — R159 Full incontinence of feces: Secondary | ICD-10-CM | POA: Diagnosis not present

## 2021-03-27 DIAGNOSIS — K6289 Other specified diseases of anus and rectum: Secondary | ICD-10-CM | POA: Insufficient documentation

## 2021-03-27 DIAGNOSIS — E860 Dehydration: Secondary | ICD-10-CM | POA: Diagnosis not present

## 2021-03-27 DIAGNOSIS — E109 Type 1 diabetes mellitus without complications: Secondary | ICD-10-CM | POA: Insufficient documentation

## 2021-03-27 DIAGNOSIS — Z794 Long term (current) use of insulin: Secondary | ICD-10-CM | POA: Diagnosis not present

## 2021-03-27 DIAGNOSIS — R1013 Epigastric pain: Secondary | ICD-10-CM | POA: Insufficient documentation

## 2021-03-27 DIAGNOSIS — R197 Diarrhea, unspecified: Secondary | ICD-10-CM | POA: Diagnosis not present

## 2021-03-27 DIAGNOSIS — F17210 Nicotine dependence, cigarettes, uncomplicated: Secondary | ICD-10-CM | POA: Diagnosis not present

## 2021-03-27 DIAGNOSIS — K219 Gastro-esophageal reflux disease without esophagitis: Secondary | ICD-10-CM | POA: Insufficient documentation

## 2021-03-27 LAB — URINALYSIS, ROUTINE W REFLEX MICROSCOPIC
Bilirubin Urine: NEGATIVE
Glucose, UA: >1000 mg/dL — AB
Hgb urine dipstick: NEGATIVE
Ketones, ur: 15 mg/dL — AB
Nitrite: NEGATIVE
Specific Gravity, Urine: 1.044 — ABNORMAL HIGH (ref 1.005–1.030)
pH: 5.5 (ref 5.0–8.0)

## 2021-03-27 LAB — COMPREHENSIVE METABOLIC PANEL
ALT: 27 U/L (ref 0–44)
AST: 32 U/L (ref 15–41)
Albumin: 3.9 g/dL (ref 3.5–5.0)
Alkaline Phosphatase: 106 U/L (ref 38–126)
Anion gap: 17 — ABNORMAL HIGH (ref 5–15)
BUN: 30 mg/dL — ABNORMAL HIGH (ref 6–20)
CO2: 21 mmol/L — ABNORMAL LOW (ref 22–32)
Calcium: 10.3 mg/dL (ref 8.9–10.3)
Chloride: 96 mmol/L — ABNORMAL LOW (ref 98–111)
Creatinine, Ser: 0.89 mg/dL (ref 0.44–1.00)
GFR, Estimated: >60 mL/min (ref >60)
Glucose, Bld: 152 mg/dL — ABNORMAL HIGH (ref 70–99)
Potassium: 4 mmol/L (ref 3.5–5.1)
Sodium: 134 mmol/L — ABNORMAL LOW (ref 135–145)
Total Bilirubin: 0.3 mg/dL (ref 0.3–1.2)
Total Protein: 6.8 g/dL (ref 6.5–8.1)

## 2021-03-27 LAB — CBC
HCT: 34.4 % — ABNORMAL LOW (ref 36.0–46.0)
Hemoglobin: 11.5 g/dL — ABNORMAL LOW (ref 12.0–15.0)
MCH: 29.4 pg (ref 26.0–34.0)
MCHC: 33.4 g/dL (ref 30.0–36.0)
MCV: 88 fL (ref 80.0–100.0)
Platelets: 400 10*3/uL (ref 150–400)
RBC: 3.91 MIL/uL (ref 3.87–5.11)
RDW: 13.4 % (ref 11.5–15.5)
WBC: 6.8 10*3/uL (ref 4.0–10.5)
nRBC: 0 % (ref 0.0–0.2)

## 2021-03-27 LAB — LIPASE, BLOOD: Lipase: 281 U/L — ABNORMAL HIGH (ref 11–51)

## 2021-03-27 LAB — PREGNANCY, URINE: Preg Test, Ur: NEGATIVE

## 2021-03-27 NOTE — ED Triage Notes (Signed)
Pt reports epigastric pain and nausea x a wk - also states she can't control her bowels and like constantly coming out.   Pt just had hand surgery and was on abx for 2 wks. Denies fever.

## 2021-03-27 NOTE — ED Provider Notes (Signed)
[facility] Provider Note: Connie Dell, MD, FACEP  CSN: [id number] MRN: [id number] ARRIVAL: 03/27/21 at 1937 ROOM: DB007/DB007   CHIEF COMPLAINT  Diarrhea   HISTORY OF PRESENT ILLNESS  03/27/21 11:58 PM Connie Vaughn is a 45 y.o. female is a type I diabetic who recently had surgery of the right hand on 03/09/2021 for a flexor sheath infection.  She was discharged on doxycycline 100 mg twice daily.  She completed this course about a week ago.  Since the onset of her infection she has had labile blood sugars typically running in the 300s which is not typical for her.  She is here with 1 week of diarrhea and fecal incontinence.  She states the stools are sometimes mixed with mucus and sometimes have an abnormal odor but she has not noticed any steatorrhea.  She has had associated epigastric pain.  The pain is present basically only when her epigastrium is pressed.  She rates her pain as a 4 out of 10.  She had a CT scan of the abdomen and pelvis performed yesterday at [facility] which showed no acute abnormalities.  It did show hepatomegaly.  She is also having perianal pain and irritation which she attributes to frequent bowel movements and toilet tissue use.   Past Medical History:  Diagnosis Date   ADHD (attention deficit hyperactivity disorder)    Anemia    Diabetes mellitus type 1, uncontrolled (HCC)    GERD (gastroesophageal reflux disease)    Incontinence, feces    PTSD (post-traumatic stress disorder)     Past Surgical History:  Procedure Laterality Date   I & D EXTREMITY Right 03/09/2021   Procedure: IRRIGATION AND DEBRIDEMENT RIGHT RING FINGER;  Surgeon: Betha Loa, MD;  Location: [facility] OR;  Service: Orthopedics;  Laterality: Right;   TUBAL LIGATION      Family History  Problem Relation Age of Onset   Heart disease Mother    Congestive Heart Failure Mother    Congestive Heart Failure Sister    Diabetes Maternal Grandmother     Social History   Tobacco Use    Smoking status: Some Days    Packs/day: 0.25    Years: 2.00    Pack years: 0.50    Types: Cigarettes   Smokeless tobacco: Never   Tobacco comments:    also uses Black and Mild  Vaping Use   Vaping Use: Never used  Substance Use Topics   Alcohol use: Never   Drug use: Yes    Types: Marijuana    Comment: edibles    Prior to Admission medications   Medication Sig Start Date End Date Taking? Authorizing Provider  D3-50 1.25 MG (50000 UT) capsule Take 50,000 Units by mouth 3 (three) times a week. 03/06/21   [provider]  glucose blood test strip Use as instructed 11/18/17   [name], NP  insulin aspart (NOVOLOG) 100 UNIT/ML FlexPen Inject 10-12 Units into the skin 3 (three) times daily with meals. Patient taking differently: Inject 12-15 Units into the skin 3 (three) times daily with meals. Sliding scale 11/18/17   [name], NP  insulin degludec (TRESIBA FLEXTOUCH) 100 UNIT/ML FlexTouch Pen Inject 18 Units into the skin at bedtime. 08/22/19   [provider]  Insulin Pen Needle (B-D UF III MINI PEN NEEDLES) 31G X 5 MM MISC Use as instructed 11/18/17   [name], NP  Lancets MISC by Does not apply route. 07/09/13   [provider]  Allergies Sulfa antibiotics   REVIEW OF SYSTEMS  Negative except as noted here or in the History of Present Illness.   PHYSICAL EXAMINATION  Initial Vital Signs Blood pressure 111/60, pulse 97, temperature 98.5 F (36.9 C), temperature source Oral, resp. rate 16, height 5\' 4"  (1.626 m), weight 57 kg, last menstrual period 02/26/2021, SpO2 98 %.  Examination General: Well-developed, well-nourished female in no acute distress; appearance consistent with age of record HENT: normocephalic; atraumatic Eyes: pupils equal, round and reactive to light; extraocular muscles intact Neck: supple Heart: regular rate and rhythm Lungs: clear to auscultation bilaterally Abdomen: soft; nondistended; nontender; or  hepatosplenomegaly; bowel sounds present Extremities: No deformity; full range of motion; pulses normal Neurologic: Awake, alert and oriented; motor function intact in all extremities and symmetric; no facial droop Skin: Warm and dry Psychiatric: Normal mood and affect   RESULTS  Summary of this visit's results, reviewed and interpreted by myself:   EKG Interpretation  Date/Time:    Ventricular Rate:    PR Interval:    QRS Duration:   QT Interval:    QTC Calculation:   R Axis:     Text Interpretation:         Laboratory Studies: Results for orders placed or performed during the hospital encounter of 03/27/21 (from the past 24 hour(s))  Lipase, blood     Status: Abnormal   Collection Time: 03/27/21  9:20 PM  Result Value Ref Range   Lipase 281 (H) 11 - 51 U/L  Comprehensive metabolic panel     Status: Abnormal   Collection Time: 03/27/21  9:20 PM  Result Value Ref Range   Sodium 134 (L) 135 - 145 mmol/L   Potassium 4.0 3.5 - 5.1 mmol/L   Chloride 96 (L) 98 - 111 mmol/L   CO2 21 (L) 22 - 32 mmol/L   Glucose, Bld 152 (H) 70 - 99 mg/dL   BUN 30 (H) 6 - 20 mg/dL   Creatinine, Ser 03/29/21 0.44 - 1.00 mg/dL   Calcium 4.09 8.9 - 81.1 mg/dL   Total Protein 6.8 6.5 - 8.1 g/dL   Albumin 3.9 3.5 - 5.0 g/dL   AST 32 15 - 41 U/L   ALT 27 0 - 44 U/L   Alkaline Phosphatase 106 38 - 126 U/L   Total Bilirubin 0.3 0.3 - 1.2 mg/dL   GFR, Estimated 91.4 >78 mL/min   Anion gap 17 (H) 5 - 15  CBC     Status: Abnormal   Collection Time: 03/27/21  9:20 PM  Result Value Ref Range   WBC 6.8 4.0 - 10.5 K/uL   RBC 3.91 3.87 - 5.11 MIL/uL   Hemoglobin 11.5 (L) 12.0 - 15.0 g/dL   HCT 03/29/21 (L) 56.2 - 13.0 %   MCV 88.0 80.0 - 100.0 fL   MCH 29.4 26.0 - 34.0 pg   MCHC 33.4 30.0 - 36.0 g/dL   RDW 86.5 78.4 - 69.6 %   Platelets 400 150 - 400 K/uL   nRBC 0.0 0.0 - 0.2 %  Urinalysis, Routine w reflex microscopic Urine, Clean Catch     Status: Abnormal   Collection Time: 03/27/21  9:59 PM   Result Value Ref Range   Color, Urine YELLOW YELLOW   APPearance CLEAR CLEAR   Specific Gravity, Urine 1.044 (H) 1.005 - 1.030   pH 5.5 5.0 - 8.0   Glucose, UA >1,000 (A) NEGATIVE mg/dL   Hgb urine dipstick NEGATIVE NEGATIVE   Bilirubin Urine NEGATIVE NEGATIVE  Ketones, ur 15 (A) NEGATIVE mg/dL   Protein, ur TRACE (A) NEGATIVE mg/dL   Nitrite NEGATIVE NEGATIVE   Leukocytes,Ua SMALL (A) NEGATIVE   RBC / HPF 0-5 0 - 5 RBC/hpf   WBC, UA 0-5 0 - 5 WBC/hpf   Squamous Epithelial / LPF 0-5 0 - 5   Mucus PRESENT    Ca Oxalate Crys, UA PRESENT   Pregnancy, urine     Status: None   Collection Time: 03/27/21  9:59 PM  Result Value Ref Range   Preg Test, Ur NEGATIVE NEGATIVE   Imaging Studies: No results found.  ED COURSE and MDM  Nursing notes, initial and subsequent vitals signs, including pulse oximetry, reviewed and interpreted by myself.  Vitals:   03/28/21 0030 03/28/21 0155 03/28/21 0230 03/28/21 0330  BP: 138/67 (!) 145/69 (!) 158/80 113/70  Pulse: 94 97 85 96  Resp: 16 15 16 16   Temp:      TempSrc:      SpO2: 100% 99% 100% 100%  Weight:      Height:       Medications  lactated ringers bolus 1,000 mL (0 mLs Intravenous Stopped 03/28/21 0256)   3:39 AM Patient given a liter of LR for dehydration.  She has not been able to provide a stool specimen so we will have her collect a stool specimen as an outpatient.  Doxycycline is a very unlikely cause of C. difficile colitis and in fact usually has a protective effect against C. difficile colitis.  The patient does not wish to start any empiric antibiotics for C. difficile without definitive proof of an infection.  She was advised of her CT scan which showed no acute findings.  Her pancreas was unremarkable and the CT despite her lipase being elevated.  She could have a mild pancreatitis.  She specifically does not wish to take any medications for her pancreatitis as she does not like opioids.   PROCEDURES  Procedures   ED  DIAGNOSES     ICD-10-CM   1. Diarrhea, unspecified type  R19.7     2. Elevated lipase  R74.8     3. Dehydration  E86.0          Tykerria Mccubbins, 03/30/21, MD 03/28/21 [phone]

## 2021-03-28 MED ORDER — LACTATED RINGERS IV BOLUS
1000.0000 mL | Freq: Once | INTRAVENOUS | Status: AC
  Administered 2021-03-28: 1000 mL via INTRAVENOUS

## 2021-07-24 ENCOUNTER — Encounter (INDEPENDENT_AMBULATORY_CARE_PROVIDER_SITE_OTHER): Payer: BC Managed Care – PPO | Admitting: Ophthalmology

## 2021-07-28 ENCOUNTER — Other Ambulatory Visit: Payer: Self-pay

## 2021-07-28 ENCOUNTER — Encounter (HOSPITAL_COMMUNITY): Payer: Self-pay

## 2021-07-28 ENCOUNTER — Emergency Department (HOSPITAL_COMMUNITY)
Admission: EM | Admit: 2021-07-28 | Discharge: 2021-07-28 | Disposition: A | Discharge status: Home / Self Care | Payer: BC Managed Care – PPO | Attending: Emergency Medicine | Admitting: Emergency Medicine

## 2021-07-28 DIAGNOSIS — R5383 Other fatigue: Secondary | ICD-10-CM | POA: Insufficient documentation

## 2021-07-28 DIAGNOSIS — R739 Hyperglycemia, unspecified: Secondary | ICD-10-CM | POA: Diagnosis present

## 2021-07-28 DIAGNOSIS — M791 Myalgia, unspecified site: Secondary | ICD-10-CM | POA: Diagnosis not present

## 2021-07-28 DIAGNOSIS — Z794 Long term (current) use of insulin: Secondary | ICD-10-CM | POA: Diagnosis not present

## 2021-07-28 LAB — COMPREHENSIVE METABOLIC PANEL
ALT: 21 U/L (ref 0–44)
AST: 24 U/L (ref 15–41)
Albumin: 3.7 g/dL (ref 3.5–5.0)
Alkaline Phosphatase: 81 U/L (ref 38–126)
Anion gap: 10 (ref 5–15)
BUN: 17 mg/dL (ref 6–20)
CO2: 28 mmol/L (ref 22–32)
Calcium: 9.4 mg/dL (ref 8.9–10.3)
Chloride: 98 mmol/L (ref 98–111)
Creatinine, Ser: 0.79 mg/dL (ref 0.44–1.00)
GFR, Estimated: >60 mL/min (ref >60)
Glucose, Bld: 237 mg/dL — ABNORMAL HIGH (ref 70–99)
Potassium: 3.5 mmol/L (ref 3.5–5.1)
Sodium: 136 mmol/L (ref 135–145)
Total Bilirubin: 0.5 mg/dL (ref 0.3–1.2)
Total Protein: 7.2 g/dL (ref 6.5–8.1)

## 2021-07-28 LAB — CBC WITH DIFFERENTIAL/PLATELET
Abs Immature Granulocytes: 0.01 10*3/uL (ref 0.00–0.07)
Basophils Absolute: 0.1 10*3/uL (ref 0.0–0.1)
Basophils Relative: 1 %
Eosinophils Absolute: 0.2 10*3/uL (ref 0.0–0.5)
Eosinophils Relative: 3 %
HCT: 38.6 % (ref 36.0–46.0)
Hemoglobin: 12.1 g/dL (ref 12.0–15.0)
Immature Granulocytes: 0 %
Lymphocytes Relative: 35 %
Lymphs Abs: 2.5 10*3/uL (ref 0.7–4.0)
MCH: 27.8 pg (ref 26.0–34.0)
MCHC: 31.3 g/dL (ref 30.0–36.0)
MCV: 88.5 fL (ref 80.0–100.0)
Monocytes Absolute: 0.6 10*3/uL (ref 0.1–1.0)
Monocytes Relative: 9 %
Neutro Abs: 3.6 10*3/uL (ref 1.7–7.7)
Neutrophils Relative %: 52 %
Platelets: 304 10*3/uL (ref 150–400)
RBC: 4.36 MIL/uL (ref 3.87–5.11)
RDW: 14.7 % (ref 11.5–15.5)
WBC: 7 10*3/uL (ref 4.0–10.5)
nRBC: 0 % (ref 0.0–0.2)

## 2021-07-28 LAB — CK: Total CK: 175 U/L (ref 38–234)

## 2021-07-28 LAB — CBG MONITORING, ED
Glucose-Capillary: 315 mg/dL — ABNORMAL HIGH (ref 70–99)
Glucose-Capillary: 198 mg/dL — ABNORMAL HIGH (ref 70–99)

## 2021-07-28 LAB — LACTIC ACID, PLASMA: Lactic Acid, Venous: 2.1 mmol/L (ref 0.5–1.9)

## 2021-07-28 MED ORDER — LACTATED RINGERS IV BOLUS
1000.0000 mL | Freq: Once | INTRAVENOUS | Status: AC
  Administered 2021-07-28: 1000 mL via INTRAVENOUS

## 2021-07-28 MED ORDER — KETOROLAC TROMETHAMINE 30 MG/ML IJ SOLN
15.0000 mg | Freq: Once | INTRAMUSCULAR | Status: AC
  Administered 2021-07-28: 15 mg via INTRAVENOUS
  Filled 2021-07-28: qty 1

## 2021-07-28 MED ORDER — FENTANYL CITRATE PF 50 MCG/ML IJ SOSY
50.0000 ug | PREFILLED_SYRINGE | Freq: Once | INTRAMUSCULAR | Status: AC
  Administered 2021-07-28: 50 ug via INTRAVENOUS
  Filled 2021-07-28: qty 1

## 2021-07-28 NOTE — ED Provider Notes (Signed)
[facility] EMERGENCY DEPARTMENT Provider Note   CSN: [id number] Arrival date & time: 07/28/21  [id number]     History  Chief Complaint  Patient presents with   Hyperglycemia    Connie Vaughn is a 46 y.o. female.  Patient type I diabetic here with cramping in her lower extremities.  States over the past 3 nights has had cramping and discomfort to her lower legs bilaterally that is worse at night and wakes her from sleep.  Is not there during the daytime.  She believes she is dehydrated.  Admits she does not drink much water but drinks tea instead which she states is sugar-free.  She had diarrhea several days ago which has resolved and has not had any vomiting.  She however still feels that she is dehydrated.  States her sugar was too high to measure at home earlier tonight but EMS checked that it was 355.  States compliance with her insulin "when I am at home but not so much at work".  States she had cramping in her legs similar to this several years ago but not recently.  No back pain.  No weakness, numbness or tingling.  She is never been diagnosed with neuropathy.  No chest pain or shortness of breath.  No dizziness, cough, fever, runny nose or sore throat.  She attempted to drink water at home and take mustard without relief.  The history is provided by the patient.  Hyperglycemia Associated symptoms: fatigue   Associated symptoms: no abdominal pain, no chest pain, no dizziness, no dysuria, no fever, no nausea, no shortness of breath, no vomiting and no weakness       Home Medications Prior to Admission medications   Medication Sig Start Date End Date Taking? Authorizing Provider  D3-50 1.25 MG (50000 UT) capsule Take 50,000 Units by mouth 3 (three) times a week. 03/06/21   [provider]  glucose blood test strip Use as instructed 11/18/17   Gildardo Pounds, NP  insulin aspart (NOVOLOG) 100 UNIT/ML FlexPen Inject 10-12 Units into the skin 3 (three) times  daily with meals. Patient taking differently: Inject 12-15 Units into the skin 3 (three) times daily with meals. Sliding scale 11/18/17   Gildardo Pounds, NP  insulin degludec (TRESIBA FLEXTOUCH) 100 UNIT/ML FlexTouch Pen Inject 18 Units into the skin at bedtime. 08/22/19   [provider]  Insulin Pen Needle (B-D UF III MINI PEN NEEDLES) 31G X 5 MM MISC Use as instructed 11/18/17   Gildardo Pounds, NP  Lancets MISC by Does not apply route. 07/09/13   [provider]      Allergies    Sulfa antibiotics    Review of Systems   Review of Systems  Constitutional:  Positive for fatigue. Negative for activity change, appetite change and fever.  HENT:  Negative for congestion and rhinorrhea.   Respiratory:  Negative for cough, chest tightness and shortness of breath.   Cardiovascular:  Negative for chest pain.  Gastrointestinal:  Negative for abdominal pain, nausea and vomiting.  Genitourinary:  Negative for dysuria and hematuria.  Musculoskeletal:  Positive for arthralgias and myalgias.  Skin:  Negative for rash.  Neurological:  Negative for dizziness, weakness and headaches.   all other systems are negative except as noted in the HPI and PMH.   Physical Exam Updated Vital Signs BP (!) 143/79 (BP Location: Right Arm)    Pulse (!) 101    Temp 97.7 F (36.5 C) (Oral)  Resp 19    Ht 5\' 4"  (1.626 m)    Wt 59 kg    SpO2 100%    BMI 22.31 kg/m  Physical Exam Vitals and nursing note reviewed.  Constitutional:      General: She is not in acute distress.    Appearance: She is well-developed.  HENT:     Head: Normocephalic and atraumatic.     Mouth/Throat:     Pharynx: No oropharyngeal exudate.  Eyes:     Conjunctiva/sclera: Conjunctivae normal.     Pupils: Pupils are equal, round, and reactive to light.  Neck:     Comments: No meningismus. Cardiovascular:     Rate and Rhythm: Normal rate and regular rhythm.     Heart sounds: Normal heart sounds. No murmur  heard. Pulmonary:     Effort: Pulmonary effort is normal. No respiratory distress.     Breath sounds: Normal breath sounds.  Abdominal:     Palpations: Abdomen is soft.     Tenderness: There is no abdominal tenderness. There is no guarding or rebound.  Musculoskeletal:        General: No tenderness. Normal range of motion.     Cervical back: Normal range of motion and neck supple.     Comments: Lower extremities are tender to palpation diffusely.  Compartments are soft.  Intact DP and PT pulses bilaterally. Full range of motion of bilateral knees and ankles without pain  Skin:    General: Skin is warm.     Capillary Refill: Capillary refill takes less than 2 seconds.     Findings: No rash.  Neurological:     General: No focal deficit present.     Mental Status: She is alert and oriented to person, place, and time. Mental status is at baseline.     Cranial Nerves: No cranial nerve deficit.     Motor: No abnormal muscle tone.     Coordination: Coordination normal.     Comments:  5/5 strength throughout. CN 2-12 intact.Equal grip strength.   Psychiatric:        Behavior: Behavior normal.    ED Results / Procedures / Treatments   Labs (all labs ordered are listed, but only abnormal results are displayed) Labs Reviewed  COMPREHENSIVE METABOLIC PANEL - Abnormal; Notable for the following components:      Result Value   Glucose, Bld 237 (*)    All other components within normal limits  LACTIC ACID, PLASMA - Abnormal; Notable for the following components:   Lactic Acid, Venous 2.1 (*)    All other components within normal limits  CBG MONITORING, ED - Abnormal; Notable for the following components:   Glucose-Capillary 315 (*)    All other components within normal limits  CBG MONITORING, ED - Abnormal; Notable for the following components:   Glucose-Capillary 198 (*)    All other components within normal limits  CBC WITH DIFFERENTIAL/PLATELET  CK  LACTIC ACID, PLASMA  URINALYSIS,  ROUTINE W REFLEX MICROSCOPIC  I-STAT VENOUS BLOOD GAS, ED    EKG None  Radiology No results found.  Procedures Procedures    Medications Ordered in ED Medications  lactated ringers bolus 1,000 mL (has no administration in time range)  ketorolac (TORADOL) 30 MG/ML injection 15 mg (has no administration in time range)    ED Course/ Medical Decision Making/ A&P  Medical Decision Making Amount and/or Complexity of Data Reviewed Labs: ordered.  Risk Prescription drug management.   Diabetic with lower extremity cramps and discomfort with concern for dehydration.  Vitals are stable, no distress.  Neurovascular intact.  Low suspicion for DVT as there is no swelling and the legs are symmetrically tender. Blood sugar 315  IVF and toradol given.  Labs with hyperglycemia without evidence of DKA.  Electrolytes and CK reassuring.   Patient feels improved after IV hydration.  Tolerating p.o.  Electrolytes are reassuring and CK is normal.  Low suspicion for acute limb ischemia, DVT, cellulitis.  BLood sugar improved to 198.  Discussed p.o. hydration at home, anti-inflammatories and PCP follow-up.  Return precautions are discussed.       Final Clinical Impression(s) / ED Diagnoses Final diagnoses:  Myalgia  Hyperglycemia    Rx / DC Orders ED Discharge Orders     None         Chanti Golubski, Annie Main, MD 07/28/21 [phone]

## 2021-07-28 NOTE — Discharge Instructions (Signed)
Keep yourself hydrated.  Take your medications as prescribed.  You may use sugar-free Gatorade or Powerade to help with your muscle cramps. Return to the ED with new or worsening symptoms.

## 2021-07-28 NOTE — ED Triage Notes (Signed)
Pt c/o dehydration, stress, and "high blood sugars". Per pt, last CBG read as "high". Pt states she has T1D and was previously admitted for DKA.

## 2021-11-27 ENCOUNTER — Encounter: Payer: Self-pay | Admitting: Gastroenterology

## 2021-12-24 ENCOUNTER — Ambulatory Visit: Payer: BC Managed Care – PPO | Admitting: Gastroenterology

## 2022-09-14 IMAGING — DX DG FINGER RING 2+V*R*
3 series · 3 of 3 positions shown · non-contrast
Comparison: None.

CLINICAL DATA: Right fourth finger wound.

EXAM:
RIGHT RING FINGER 2+V

[finger ap]
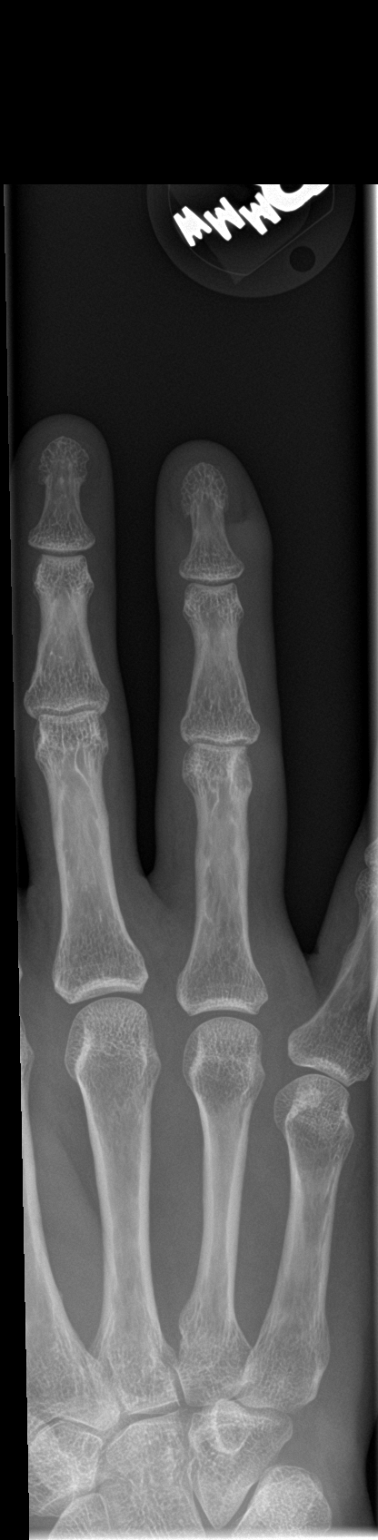

[finger obl]
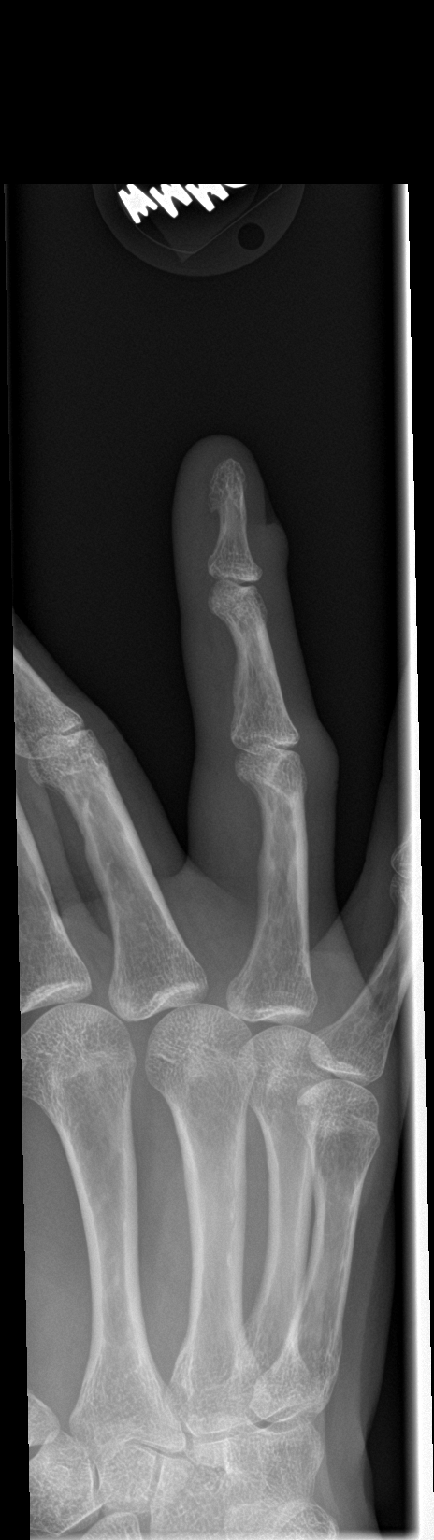

[finger lat]
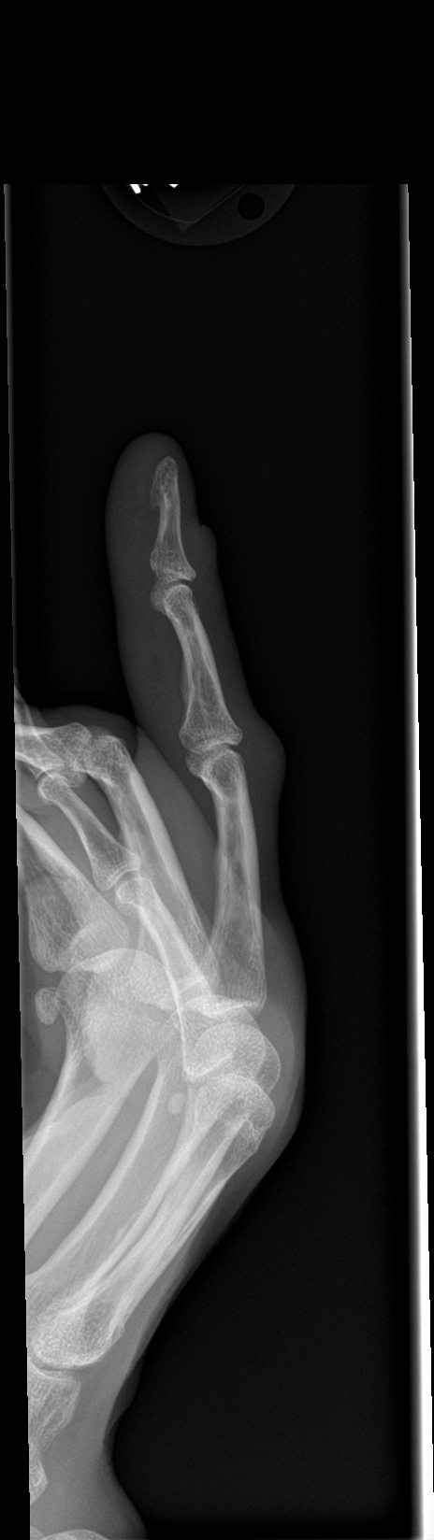

[3 of 3 positions shown; findings below may reference images not displayed]

FINDINGS: There is no evidence of fracture or dislocation. There is no
evidence of arthropathy or other focal bone abnormality. Soft tissue
swelling is noted around the proximal interphalangeal joint. Diffuse
soft tissue swelling of the finger is noted suggesting cellulitis.
IMPRESSION: Diffuse soft tissue swelling is seen involving the right fourth
finger concerning for cellulitis. No lytic destruction is noted to
suggest osteomyelitis.

## 2022-09-18 IMAGING — DX DG CHEST 1V
1 series · 1 of 1 positions shown · non-contrast
Comparison: Portable chest 10/06/2020.

CLINICAL DATA: 45-year-old female with chest pain.

EXAM:
CHEST  1 VIEW

[chest ap]
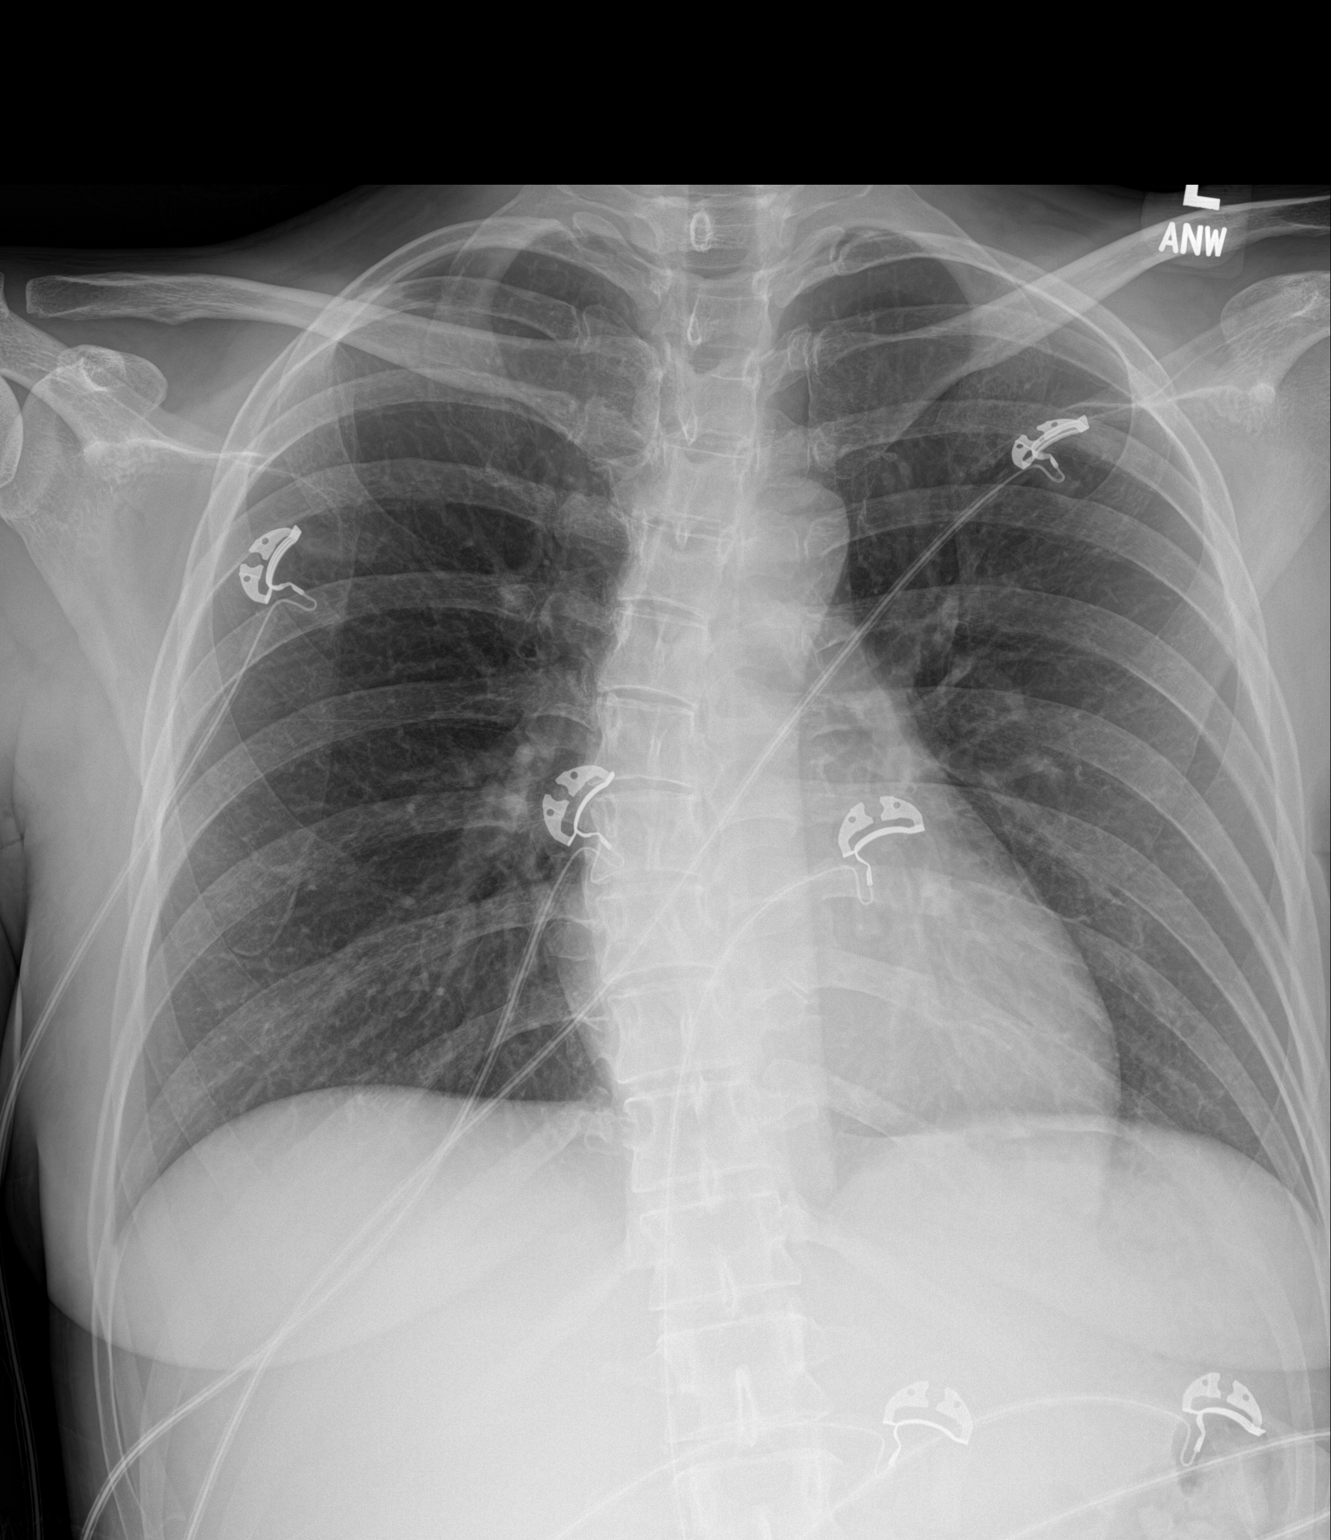

[1 of 1 positions shown; findings below may reference images not displayed]

FINDINGS: Portable AP upright view at 1719 hours. Lung volumes and mediastinal
contours remain normal. Visualized tracheal air column is within
normal limits. No acute osseous abnormality identified. Chronic
dextroconvex thoracic scoliosis. Paucity of bowel gas.
IMPRESSION: Negative portable chest.

## 2022-09-18 IMAGING — DX DG HAND COMPLETE 3+V*R*
3 series · 3 of 3 positions shown · non-contrast
Comparison: Right 4th finger series 03/05/2021.

CLINICAL DATA: 45-year-old female with right hand cellulitis
following crush injury of the 4th finger at work 3 weeks ago. No
improvement with antibiotics.

EXAM:
RIGHT HAND - COMPLETE 3+ VIEW

[hand ap]
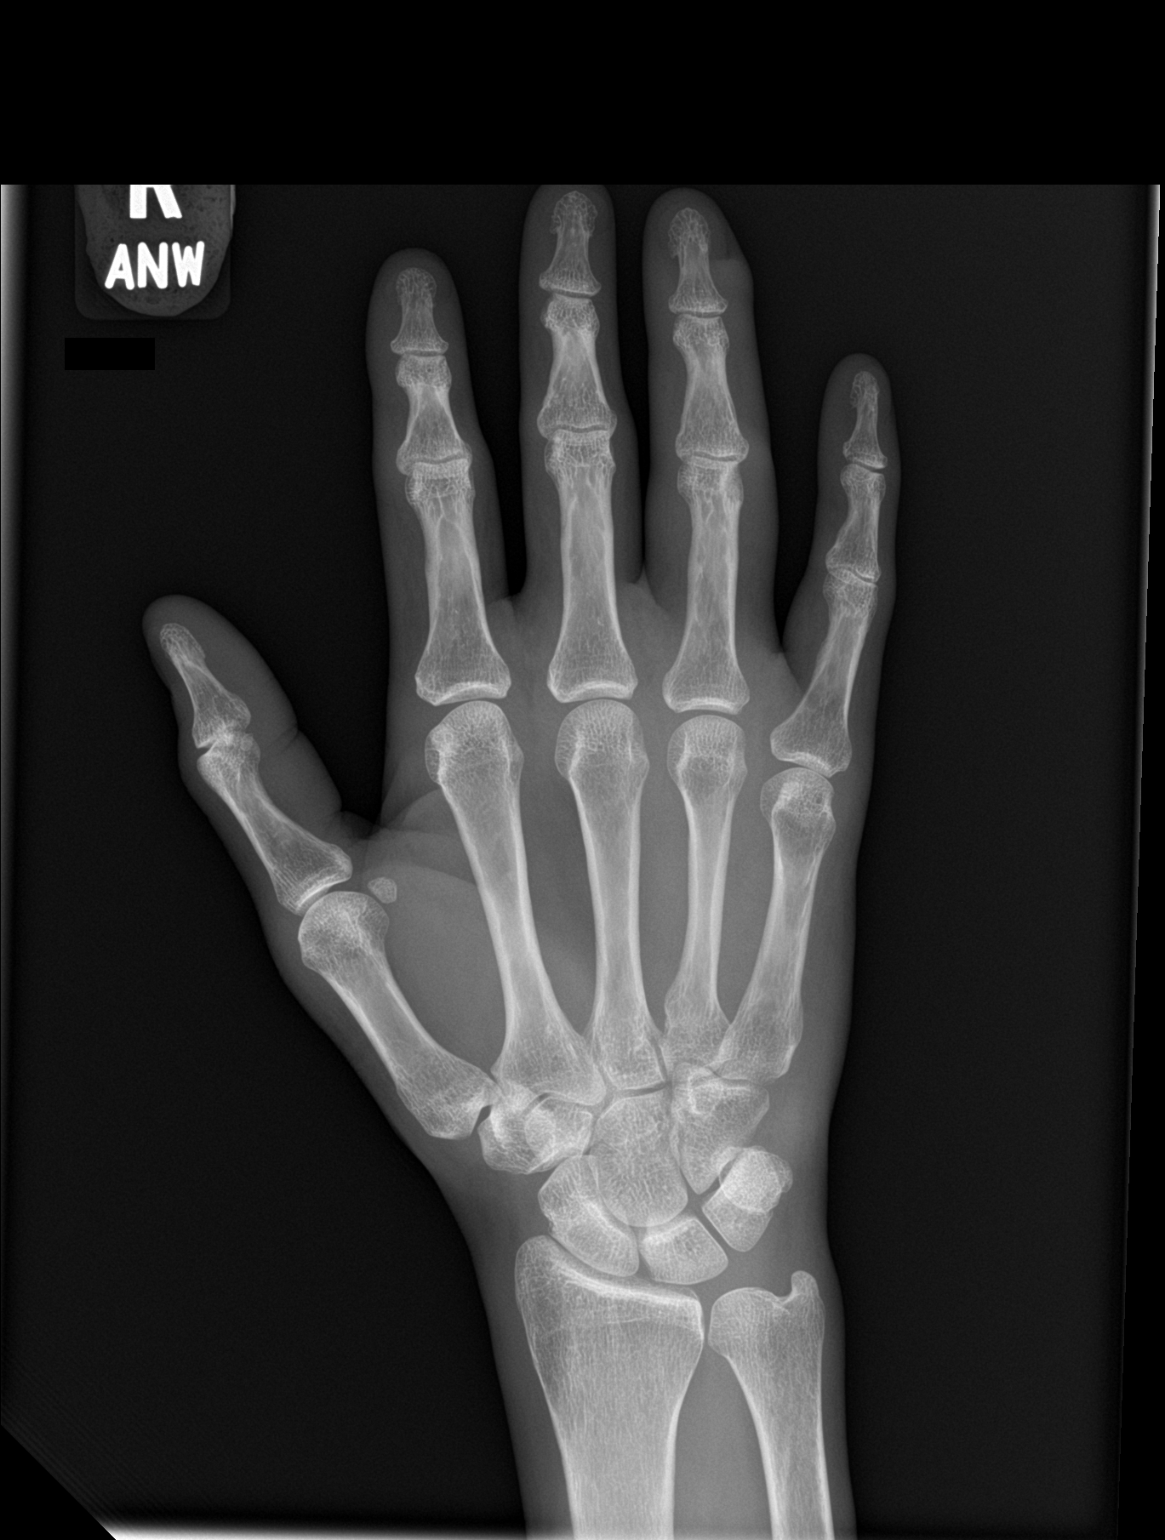

[hand obl]
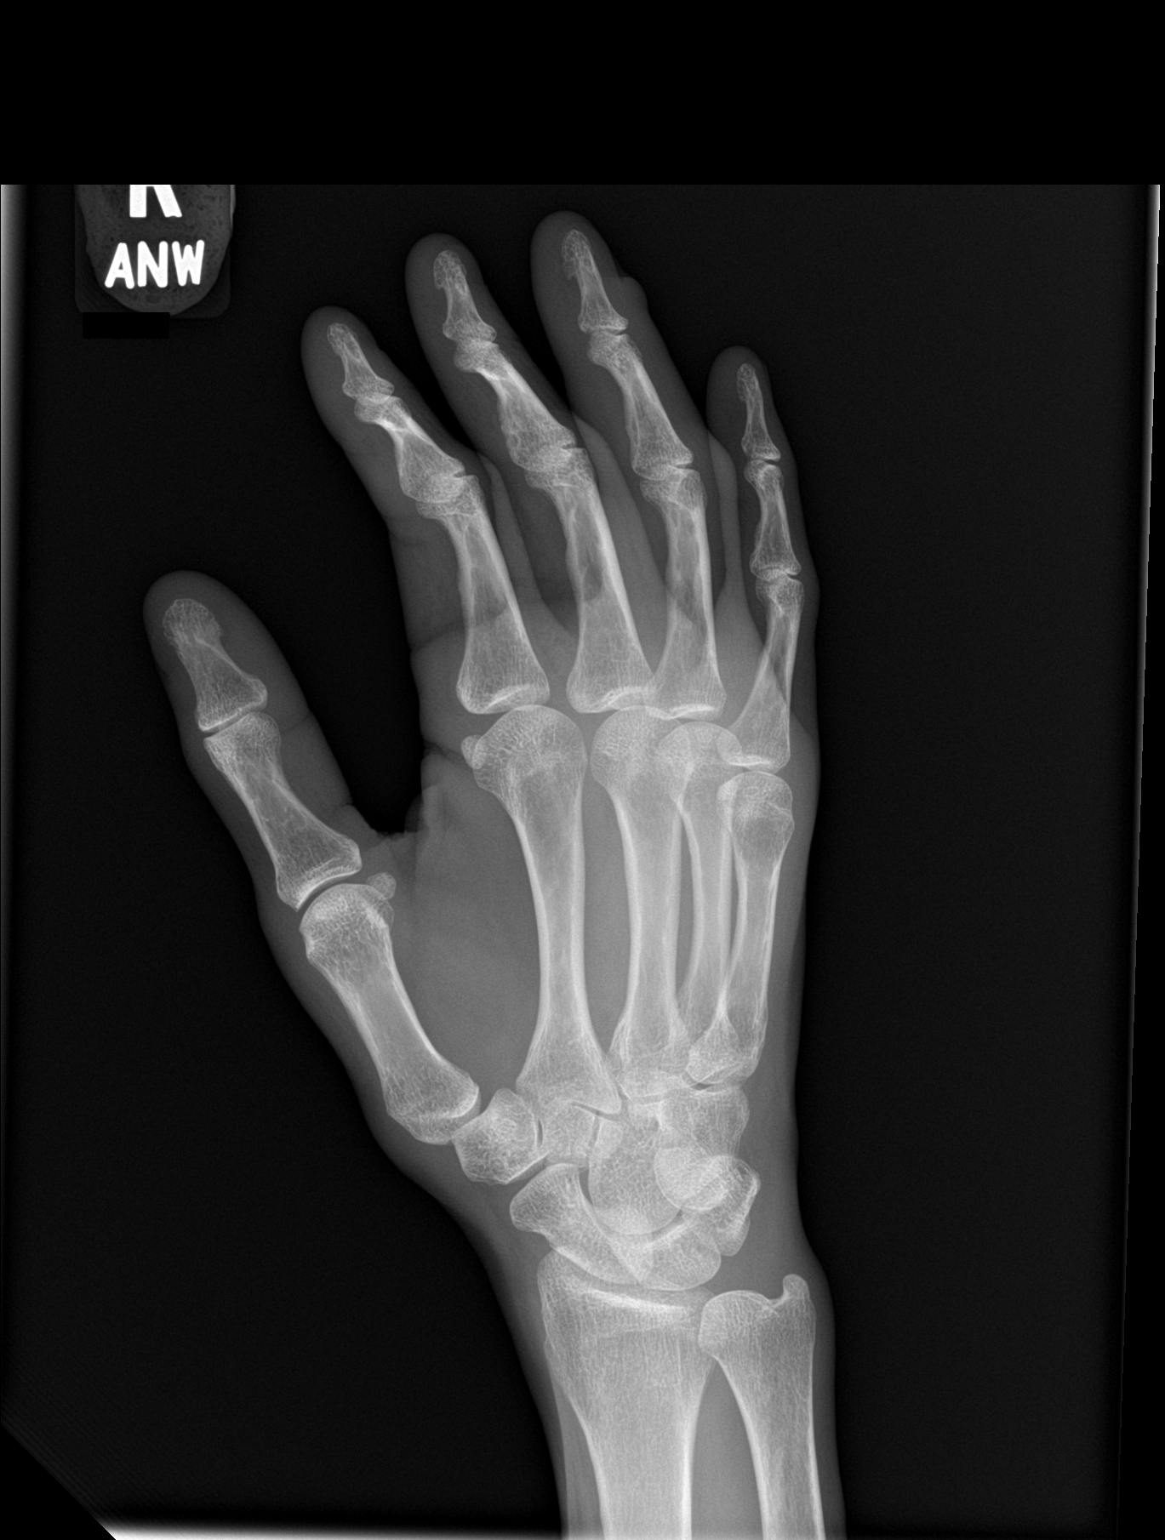

[hand lat]
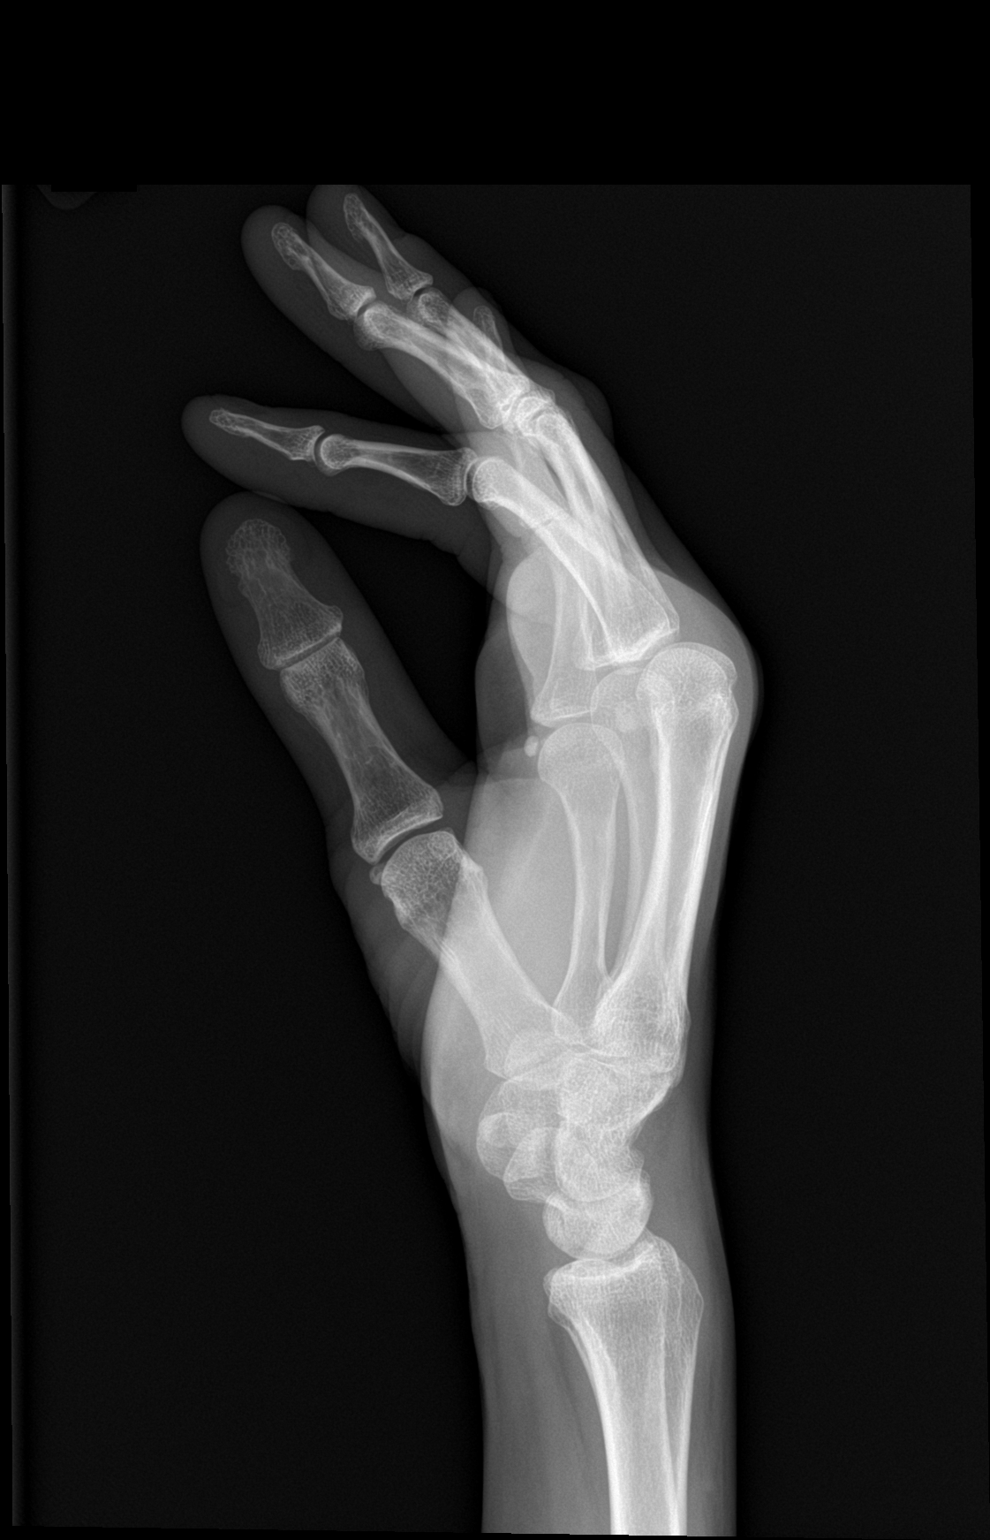

[3 of 3 positions shown; findings below may reference images not displayed]

FINDINGS: 3 portable views of the right hand. Soft tissue swelling about the
right 4th finger, especially the ulnar and dorsal aspect. No soft
tissue gas. Underlying phalanges appear intact and normally aligned.
Normal joint spaces.

Negative elsewhere, no acute osseous abnormality identified.
IMPRESSION: Soft tissue swelling with no osseous abnormality identified in the
right hand.

## 2022-11-14 ENCOUNTER — Other Ambulatory Visit: Payer: Self-pay

## 2022-11-14 ENCOUNTER — Emergency Department (HOSPITAL_BASED_OUTPATIENT_CLINIC_OR_DEPARTMENT_OTHER)
Admission: EM | Admit: 2022-11-14 | Discharge: 2022-11-14 | Disposition: A | Discharge status: Home / Self Care | Payer: Managed Care, Other (non HMO) | Attending: Emergency Medicine | Admitting: Emergency Medicine

## 2022-11-14 DIAGNOSIS — M791 Myalgia, unspecified site: Secondary | ICD-10-CM | POA: Insufficient documentation

## 2022-11-14 DIAGNOSIS — Z794 Long term (current) use of insulin: Secondary | ICD-10-CM | POA: Diagnosis not present

## 2022-11-14 DIAGNOSIS — E1065 Type 1 diabetes mellitus with hyperglycemia: Secondary | ICD-10-CM | POA: Insufficient documentation

## 2022-11-14 DIAGNOSIS — R739 Hyperglycemia, unspecified: Secondary | ICD-10-CM

## 2022-11-14 DIAGNOSIS — R252 Cramp and spasm: Secondary | ICD-10-CM | POA: Diagnosis present

## 2022-11-14 LAB — PREGNANCY, URINE: Preg Test, Ur: NEGATIVE

## 2022-11-14 LAB — I-STAT VENOUS BLOOD GAS, ED
Acid-Base Excess: 3 mmol/L — ABNORMAL HIGH (ref 0.0–2.0)
Bicarbonate: 28.7 mmol/L — ABNORMAL HIGH (ref 20.0–28.0)
Calcium, Ion: 1.26 mmol/L (ref 1.15–1.40)
HCT: 33 % — ABNORMAL LOW (ref 36.0–46.0)
Hemoglobin: 11.2 g/dL — ABNORMAL LOW (ref 12.0–15.0)
O2 Saturation: 34 %
Patient temperature: 98.2
Potassium: 3.6 mmol/L (ref 3.5–5.1)
Sodium: 134 mmol/L — ABNORMAL LOW (ref 135–145)
TCO2: 30 mmol/L (ref 22–32)
pCO2, Ven: 49.2 mmHg (ref 44–60)
pH, Ven: 7.373 (ref 7.25–7.43)
pO2, Ven: 21 mmHg — CL (ref 32–45)

## 2022-11-14 LAB — URINALYSIS, ROUTINE W REFLEX MICROSCOPIC
Bilirubin Urine: NEGATIVE
Glucose, UA: >1000 mg/dL — AB
Ketones, ur: NEGATIVE mg/dL
Leukocytes,Ua: NEGATIVE
Nitrite: NEGATIVE
Specific Gravity, Urine: 1.012 (ref 1.005–1.030)
pH: 6 (ref 5.0–8.0)

## 2022-11-14 LAB — BASIC METABOLIC PANEL
Anion gap: 9 (ref 5–15)
BUN: 29 mg/dL — ABNORMAL HIGH (ref 6–20)
CO2: 28 mmol/L (ref 22–32)
Calcium: 9.1 mg/dL (ref 8.9–10.3)
Chloride: 98 mmol/L (ref 98–111)
Creatinine, Ser: 0.92 mg/dL (ref 0.44–1.00)
GFR, Estimated: >60 mL/min (ref >60)
Glucose, Bld: 204 mg/dL — ABNORMAL HIGH (ref 70–99)
Potassium: 3.4 mmol/L — ABNORMAL LOW (ref 3.5–5.1)
Sodium: 135 mmol/L (ref 135–145)

## 2022-11-14 LAB — CBC
HCT: 35 % — ABNORMAL LOW (ref 36.0–46.0)
Hemoglobin: 12.1 g/dL (ref 12.0–15.0)
MCH: 29.8 pg (ref 26.0–34.0)
MCHC: 34.6 g/dL (ref 30.0–36.0)
MCV: 86.2 fL (ref 80.0–100.0)
Platelets: 276 10*3/uL (ref 150–400)
RBC: 4.06 MIL/uL (ref 3.87–5.11)
RDW: 13.4 % (ref 11.5–15.5)
WBC: 9.3 10*3/uL (ref 4.0–10.5)
nRBC: 0 % (ref 0.0–0.2)

## 2022-11-14 LAB — LACTIC ACID, PLASMA
Lactic Acid, Venous: 2.4 mmol/L (ref 0.5–1.9)
Lactic Acid, Venous: 1.1 mmol/L (ref 0.5–1.9)

## 2022-11-14 LAB — CK: Total CK: 63 U/L (ref 38–234)

## 2022-11-14 LAB — CBG MONITORING, ED: Glucose-Capillary: 217 mg/dL — ABNORMAL HIGH (ref 70–99)

## 2022-11-14 MED ORDER — LACTATED RINGERS IV BOLUS
1000.0000 mL | Freq: Once | INTRAVENOUS | Status: AC
  Administered 2022-11-14: 1000 mL via INTRAVENOUS

## 2022-11-14 MED ORDER — POTASSIUM CHLORIDE CRYS ER 20 MEQ PO TBCR
40.0000 meq | EXTENDED_RELEASE_TABLET | Freq: Once | ORAL | Status: AC
  Administered 2022-11-14: 40 meq via ORAL
  Filled 2022-11-14: qty 2

## 2022-11-14 MED ORDER — KETOROLAC TROMETHAMINE 30 MG/ML IJ SOLN
15.0000 mg | Freq: Once | INTRAMUSCULAR | Status: AC
  Administered 2022-11-14: 15 mg via INTRAVENOUS
  Filled 2022-11-14: qty 1

## 2022-11-14 NOTE — ED Triage Notes (Signed)
Pt arrives from home via [facility], per report, Coming from home, c/o leg cramping in both leg from knees down to her toes. Today she has only had hot tea, not as much water. CBG 317, 140/92, hr 100

## 2022-11-14 NOTE — Discharge Instructions (Addendum)
Keep yourself hydrated.  Take your insulin as prescribed.  Use anti-inflammatories as needed for muscle aches.  Follow-up with your doctor.  Return to the ED with new or worsening symptoms.

## 2022-11-14 NOTE — ED Notes (Addendum)
Pt refused CBG check, md aware and at bedside.

## 2022-11-14 NOTE — ED Triage Notes (Addendum)
Pt says that she woke up this morning with bad cramping in both legs. Reports her sugar has not been controlled, she thinks d/t stress. Has not been taking her insulin as often as she is supposed to. Nausea, denies vomiting. Says she feels dehydrated.

## 2022-11-14 NOTE — ED Provider Notes (Signed)
[facility] AT [facility] Provider Note   CSN: [id number] Arrival date & time: 11/14/22  0251     History  Chief Complaint  Patient presents with   Leg Pain    Connie Vaughn is a 47 y.o. female.  Type I diabetic presents by EMS with bilateral lower extremity cramps and pain.  States she woke out of sleep this morning with severe cramps to her bilateral lower legs from the knees down.  Denies any fall or injury.  Was not having any pain when she went to bed.  Has had similar symptoms in the past due to dehydration.  States she drinks a lot of tea at work but has not been eating or drinking very well other than that.  States she has not been as compliant with her insulin as she should be due to "being busy".  Denies chest pain, shortness of breath, nausea, vomiting, pain with urination or blood in the urine.  No back pain.  No abdominal pain.  No focal weakness, numbness or tingling. Last time this occurred was January 2023 when she was in the ED at [facility].  The history is provided by the patient and the EMS personnel.  Leg Pain Associated symptoms: no fever        Home Medications Prior to Admission medications   Medication Sig Start Date End Date Taking? Authorizing Provider  D3-50 1.25 MG (50000 UT) capsule Take 50,000 Units by mouth 3 (three) times a week. 03/06/21   [provider]  glucose blood test strip Use as instructed 11/18/17   [name], NP  insulin aspart (NOVOLOG) 100 UNIT/ML FlexPen Inject 10-12 Units into the skin 3 (three) times daily with meals. Patient not taking: Reported on 07/28/2021 11/18/17   [name], NP  insulin degludec (TRESIBA FLEXTOUCH) 100 UNIT/ML FlexTouch Pen Inject 18 Units into the skin at bedtime. 08/22/19   [provider]  Insulin Pen Needle (B-D UF III MINI PEN NEEDLES) 31G X 5 MM MISC Use as instructed 11/18/17   [name], NP  Lancets MISC by Does not apply route. 07/09/13    [provider]  LYUMJEV KWIKPEN 100 UNIT/ML KwikPen Inject 10-12 Units into the skin 2 (two) times daily. 04/03/21   [provider]      Allergies    Sulfa antibiotics    Review of Systems   Review of Systems  Constitutional:  Negative for activity change, appetite change and fever.  HENT:  Negative for congestion and rhinorrhea.   Respiratory:  Negative for cough, chest tightness and shortness of breath.   Gastrointestinal:  Negative for nausea and vomiting.  Genitourinary:  Negative for dysuria and hematuria.  Musculoskeletal:  Positive for arthralgias and myalgias.  Skin:  Negative for rash.  Neurological:  Negative for weakness and headaches.   all other systems are negative except as noted in the HPI and PMH.    Physical Exam Updated Vital Signs BP (!) 144/92 (BP Location: Right Arm)   Pulse 96   Temp 98.2 F (36.8 C) (Oral)   Resp 14   LMP 09/29/2022 (Approximate)   SpO2 100%  Physical Exam Vitals and nursing note reviewed.  Constitutional:      General: She is not in acute distress.    Appearance: She is well-developed.     Comments: tearful  HENT:     Head: Normocephalic and atraumatic.     Mouth/Throat:     Pharynx:  No oropharyngeal exudate.  Eyes:     Conjunctiva/sclera: Conjunctivae normal.     Pupils: Pupils are equal, round, and reactive to light.  Neck:     Comments: No meningismus. Cardiovascular:     Rate and Rhythm: Normal rate and regular rhythm.     Heart sounds: Normal heart sounds. No murmur heard. Pulmonary:     Effort: Pulmonary effort is normal. No respiratory distress.     Breath sounds: Normal breath sounds.  Abdominal:     Palpations: Abdomen is soft.     Tenderness: There is no abdominal tenderness. There is no guarding or rebound.  Musculoskeletal:        General: Tenderness present. No swelling. Normal range of motion.     Cervical back: Normal range of motion and neck supple.     Comments:  Lower extremities  are tender to palpation diffusely.  Compartments are soft.  Intact DP and PT pulses bilaterally. Full range of motion of bilateral knees and ankles without pain   Skin:    General: Skin is warm.  Neurological:     Mental Status: She is alert and oriented to person, place, and time.     Cranial Nerves: No cranial nerve deficit.     Motor: No abnormal muscle tone.     Coordination: Coordination normal.     Comments:  5/5 strength throughout. CN 2-12 intact.Equal grip strength.   Psychiatric:        Behavior: Behavior normal.     ED Results / Procedures / Treatments   Labs (all labs ordered are listed, but only abnormal results are displayed) Labs Reviewed  BASIC METABOLIC PANEL - Abnormal; Notable for the following components:      Result Value   Potassium 3.4 (*)    Glucose, Bld 204 (*)    BUN 29 (*)    All other components within normal limits  CBC - Abnormal; Notable for the following components:   HCT 35.0 (*)    All other components within normal limits  URINALYSIS, ROUTINE W REFLEX MICROSCOPIC - Abnormal; Notable for the following components:   Color, Urine COLORLESS (*)    Glucose, UA >1,000 (*)    Hgb urine dipstick MODERATE (*)    Protein, ur TRACE (*)    Bacteria, UA MANY (*)    All other components within normal limits  LACTIC ACID, PLASMA - Abnormal; Notable for the following components:   Lactic Acid, Venous 2.4 (*)    All other components within normal limits  CBG MONITORING, ED - Abnormal; Notable for the following components:   Glucose-Capillary 217 (*)    All other components within normal limits  I-STAT VENOUS BLOOD GAS, ED - Abnormal; Notable for the following components:   pO2, Ven 21 (*)    Bicarbonate 28.7 (*)    Acid-Base Excess 3.0 (*)    Sodium 134 (*)    HCT 33.0 (*)    Hemoglobin 11.2 (*)    All other components within normal limits  PREGNANCY, URINE  CK  LACTIC ACID, PLASMA  CBG MONITORING, ED    EKG None  Radiology No results  found.  Procedures Procedures    Medications Ordered in ED Medications  lactated ringers bolus 1,000 mL (has no administration in time range)  lactated ringers bolus 1,000 mL (has no administration in time range)    ED Course/ Medical Decision Making/ A&P  Medical Decision Making Amount and/or Complexity of Data Reviewed Independent Historian: EMS Labs: ordered. Decision-making details documented in ED Course. Radiology: ordered and independent interpretation performed. Decision-making details documented in ED Course. ECG/medicine tests: ordered and independent interpretation performed. Decision-making details documented in ED Course.  Risk Prescription drug management.   Type I diabetic with lower extremity cramping that woke her from sleep.  Vital stable, neurovascular intact distally.  No significant asymmetric swelling.  Compartments are soft.  Low suspicion for DVT.  Will hydrate, check labs, rule out DKA,  Blood sugar 217.  pH is normal, bicarb is normal, no evidence of DKA.  UA with pyuria, no infectious symptoms.  No ketones. Lactate has normalized after hydration. Electrolytes and CK are reassuring.  Potassium slightly low and replaced. Low suspicion for acute limb ischemia, DVT, cellulitis.  Patient feels improved after IV fluids and Toradol.  She is tolerating p.o. and ambulatory. Follow-up with PCP.  Return precautions discussed.       Final Clinical Impression(s) / ED Diagnoses Final diagnoses:  Myalgia  Hyperglycemia    Rx / DC Orders ED Discharge Orders     None         Eli Adami, Jeannett Senior, MD 11/14/22 [phone]

## 2023-01-14 ENCOUNTER — Emergency Department (HOSPITAL_COMMUNITY): Payer: Managed Care, Other (non HMO)

## 2023-01-14 ENCOUNTER — Other Ambulatory Visit: Payer: Self-pay

## 2023-01-14 ENCOUNTER — Emergency Department (HOSPITAL_COMMUNITY)
Admission: EM | Admit: 2023-01-14 | Discharge: 2023-01-15 | Discharge status: Against Medical Advice | Payer: Managed Care, Other (non HMO) | Attending: Emergency Medicine | Admitting: Emergency Medicine

## 2023-01-14 ENCOUNTER — Encounter (HOSPITAL_COMMUNITY): Payer: Self-pay

## 2023-01-14 DIAGNOSIS — M79675 Pain in left toe(s): Secondary | ICD-10-CM | POA: Diagnosis present

## 2023-01-14 DIAGNOSIS — E109 Type 1 diabetes mellitus without complications: Secondary | ICD-10-CM | POA: Diagnosis not present

## 2023-01-14 DIAGNOSIS — Z5321 Procedure and treatment not carried out due to patient leaving prior to being seen by health care provider: Secondary | ICD-10-CM | POA: Insufficient documentation

## 2023-01-14 LAB — CBC WITH DIFFERENTIAL/PLATELET
Abs Immature Granulocytes: 0.02 10*3/uL (ref 0.00–0.07)
Basophils Absolute: 0.1 10*3/uL (ref 0.0–0.1)
Basophils Relative: 1 %
Eosinophils Absolute: 0.2 10*3/uL (ref 0.0–0.5)
Eosinophils Relative: 2 %
HCT: 37.7 % (ref 36.0–46.0)
Hemoglobin: 12.3 g/dL (ref 12.0–15.0)
Immature Granulocytes: 0 %
Lymphocytes Relative: 29 %
Lymphs Abs: 2.5 10*3/uL (ref 0.7–4.0)
MCH: 29.1 pg (ref 26.0–34.0)
MCHC: 32.6 g/dL (ref 30.0–36.0)
MCV: 89.3 fL (ref 80.0–100.0)
Monocytes Absolute: 0.7 10*3/uL (ref 0.1–1.0)
Monocytes Relative: 8 %
Neutro Abs: 5.2 10*3/uL (ref 1.7–7.7)
Neutrophils Relative %: 60 %
Platelets: 361 10*3/uL (ref 150–400)
RBC: 4.22 MIL/uL (ref 3.87–5.11)
RDW: 14.1 % (ref 11.5–15.5)
WBC: 8.7 10*3/uL (ref 4.0–10.5)
nRBC: 0 % (ref 0.0–0.2)

## 2023-01-14 LAB — COMPREHENSIVE METABOLIC PANEL
ALT: 13 U/L (ref 0–44)
AST: 18 U/L (ref 15–41)
Albumin: 3.3 g/dL — ABNORMAL LOW (ref 3.5–5.0)
Alkaline Phosphatase: 97 U/L (ref 38–126)
Anion gap: 14 (ref 5–15)
BUN: 20 mg/dL (ref 6–20)
CO2: 28 mmol/L (ref 22–32)
Calcium: 9.8 mg/dL (ref 8.9–10.3)
Chloride: 95 mmol/L — ABNORMAL LOW (ref 98–111)
Creatinine, Ser: 1.03 mg/dL — ABNORMAL HIGH (ref 0.44–1.00)
GFR, Estimated: >60 mL/min (ref >60)
Glucose, Bld: 308 mg/dL — ABNORMAL HIGH (ref 70–99)
Potassium: 3.8 mmol/L (ref 3.5–5.1)
Sodium: 137 mmol/L (ref 135–145)
Total Bilirubin: 0.3 mg/dL (ref 0.3–1.2)
Total Protein: 7 g/dL (ref 6.5–8.1)

## 2023-01-14 LAB — CBG MONITORING, ED: Glucose-Capillary: 280 mg/dL — ABNORMAL HIGH (ref 70–99)

## 2023-01-14 NOTE — ED Provider Triage Note (Addendum)
Emergency Medicine Provider Triage Evaluation Note  Connie Vaughn , a 47 y.o. female  was evaluated in triage.  History of T1DM. Pt complains of left big toe pain and swelling. She reports picking at her toe a couple days ago at a bug bite now with increased swelling and redness. Notes pain spreading up her foot. Denies any fevers or chills.   Patient unsure of her normal blood pressures, no history of hypertension.  Blood pressure initially here is 94/68  Review of Systems  Positive: As above Negative: As above  Physical Exam  BP 94/68 (BP Location: Right Arm)   Pulse (!) 102   Temp 98.4 F (36.9 C) (Oral)   Resp 15   Ht 5\' 4"  (1.626 m)   Wt 61.2 kg   SpO2 99%   BMI 23.17 kg/m  Gen:   Awake, no distress   Resp:  Normal effort  MSK:   Moves extremities without difficulty  Other:  Left big toe with wound on lateral aspect of the toe.  Erythema and edema around the nailbed Tachycardia  Medical Decision Making  Medically screening exam initiated at 8:53 PM.  Appropriate orders placed.  Rudene Anda was informed that the remainder of the evaluation will be completed by another provider, this initial triage assessment does not replace that evaluation, and the importance of remaining in the ED until their evaluation is complete.    Arabella Merles, PA-C 01/14/23 2101    Arabella Merles, PA-C 01/14/23 2102

## 2023-01-14 NOTE — ED Triage Notes (Signed)
Pt reports picking at small wound on left big toe and today the toe and anterior part of foot began to swell. Pt denies pain but reports being diabetic with peripheral neuropathy. Pt also reports problems with controlling blood sugar. Home reading are "high" on glucometer. Pt denies increased thirst or urination.

## 2023-01-15 ENCOUNTER — Observation Stay (HOSPITAL_COMMUNITY): Payer: Managed Care, Other (non HMO)

## 2023-01-15 ENCOUNTER — Encounter (HOSPITAL_COMMUNITY): Payer: Self-pay | Admitting: Internal Medicine

## 2023-01-15 ENCOUNTER — Observation Stay (HOSPITAL_BASED_OUTPATIENT_CLINIC_OR_DEPARTMENT_OTHER)
Admission: EM | Admit: 2023-01-15 | Discharge: 2023-01-16 | Disposition: A | Discharge status: Home / Self Care | Payer: Managed Care, Other (non HMO) | Attending: Internal Medicine | Admitting: Internal Medicine

## 2023-01-15 ENCOUNTER — Other Ambulatory Visit: Payer: Self-pay

## 2023-01-15 DIAGNOSIS — E10649 Type 1 diabetes mellitus with hypoglycemia without coma: Secondary | ICD-10-CM | POA: Insufficient documentation

## 2023-01-15 DIAGNOSIS — L03119 Cellulitis of unspecified part of limb: Secondary | ICD-10-CM | POA: Diagnosis not present

## 2023-01-15 DIAGNOSIS — F17210 Nicotine dependence, cigarettes, uncomplicated: Secondary | ICD-10-CM | POA: Insufficient documentation

## 2023-01-15 DIAGNOSIS — Z79899 Other long term (current) drug therapy: Secondary | ICD-10-CM | POA: Insufficient documentation

## 2023-01-15 DIAGNOSIS — Z794 Long term (current) use of insulin: Secondary | ICD-10-CM | POA: Diagnosis not present

## 2023-01-15 DIAGNOSIS — L03116 Cellulitis of left lower limb: Secondary | ICD-10-CM | POA: Diagnosis not present

## 2023-01-15 DIAGNOSIS — M869 Osteomyelitis, unspecified: Secondary | ICD-10-CM | POA: Diagnosis not present

## 2023-01-15 DIAGNOSIS — L089 Local infection of the skin and subcutaneous tissue, unspecified: Principal | ICD-10-CM

## 2023-01-15 DIAGNOSIS — E1065 Type 1 diabetes mellitus with hyperglycemia: Secondary | ICD-10-CM | POA: Diagnosis not present

## 2023-01-15 DIAGNOSIS — E11628 Type 2 diabetes mellitus with other skin complications: Secondary | ICD-10-CM | POA: Insufficient documentation

## 2023-01-15 LAB — BASIC METABOLIC PANEL
Anion gap: 11 (ref 5–15)
BUN: 20 mg/dL (ref 6–20)
CO2: 22 mmol/L (ref 22–32)
Calcium: 8.4 mg/dL — ABNORMAL LOW (ref 8.9–10.3)
Chloride: 104 mmol/L (ref 98–111)
Creatinine, Ser: 0.98 mg/dL (ref 0.44–1.00)
GFR, Estimated: >60 mL/min (ref >60)
Glucose, Bld: 154 mg/dL — ABNORMAL HIGH (ref 70–99)
Potassium: 4.5 mmol/L (ref 3.5–5.1)
Sodium: 137 mmol/L (ref 135–145)

## 2023-01-15 MED ORDER — SODIUM CHLORIDE 0.9 % IV SOLN: Freq: Once | INTRAVENOUS | Status: DC

## 2023-01-15 MED ORDER — SODIUM CHLORIDE 0.9 % IV BOLUS (SEPSIS)
1000.0000 mL | Freq: Once | INTRAVENOUS | Status: AC
  Administered 2023-01-15: 1000 mL via INTRAVENOUS

## 2023-01-15 MED ORDER — INSULIN ASPART 100 UNIT/ML IJ SOLN
0.0000 [IU] | Freq: Three times a day (TID) | INTRAMUSCULAR | Status: DC
  Administered 2023-01-16: 2 [IU] via SUBCUTANEOUS
  Administered 2023-01-16: 7 [IU] via SUBCUTANEOUS

## 2023-01-15 MED ORDER — POLYETHYLENE GLYCOL 3350 17 G PO PACK: 17.0000 g | PACK | Freq: Every day | ORAL | Status: DC | PRN

## 2023-01-15 MED ORDER — GADOBUTROL 1 MMOL/ML IV SOLN
6.0000 mL | Freq: Once | INTRAVENOUS | Status: AC | PRN
  Administered 2023-01-15: 6 mL via INTRAVENOUS

## 2023-01-15 MED ORDER — INSULIN GLARGINE-YFGN 100 UNIT/ML ~~LOC~~ SOLN
10.0000 [IU] | Freq: Every day | SUBCUTANEOUS | Status: DC
  Administered 2023-01-15: 5 [IU] via SUBCUTANEOUS
  Filled 2023-01-15 (×2): qty 0.1

## 2023-01-15 MED ORDER — SODIUM CHLORIDE 0.9% FLUSH
3.0000 mL | Freq: Two times a day (BID) | INTRAVENOUS | Status: DC
  Administered 2023-01-15 (×2): 3 mL via INTRAVENOUS

## 2023-01-15 MED ORDER — SODIUM CHLORIDE 0.9 % IV BOLUS: 1000.0000 mL | Freq: Once | INTRAVENOUS | Status: DC

## 2023-01-15 MED ORDER — INSULIN ASPART 100 UNIT/ML IJ SOLN
0.0000 [IU] | Freq: Every day | INTRAMUSCULAR | Status: DC
  Administered 2023-01-16: 2 [IU] via SUBCUTANEOUS

## 2023-01-15 MED ORDER — ACETAMINOPHEN 325 MG PO TABS: 650.0000 mg | ORAL_TABLET | Freq: Four times a day (QID) | ORAL | Status: DC | PRN

## 2023-01-15 MED ORDER — METRONIDAZOLE 500 MG/100ML IV SOLN
500.0000 mg | Freq: Two times a day (BID) | INTRAVENOUS | Status: DC
  Administered 2023-01-15 – 2023-01-16 (×3): 500 mg via INTRAVENOUS
  Filled 2023-01-15 (×3): qty 100

## 2023-01-15 MED ORDER — SODIUM CHLORIDE 0.9 % IV SOLN
1000.0000 mL | INTRAVENOUS | Status: DC
  Administered 2023-01-15 (×2): 1000 mL via INTRAVENOUS

## 2023-01-15 MED ORDER — SODIUM CHLORIDE 0.9 % IV SOLN
2.0000 g | INTRAVENOUS | Status: DC
  Administered 2023-01-15 – 2023-01-16 (×2): 2 g via INTRAVENOUS
  Filled 2023-01-15 (×2): qty 20

## 2023-01-15 MED ORDER — ACETAMINOPHEN 650 MG RE SUPP: 650.0000 mg | Freq: Four times a day (QID) | RECTAL | Status: DC | PRN

## 2023-01-15 NOTE — H&P (Signed)
History and Physical   Connie Vaughn DGU:[id number] DOB: Feb 12, 1976 DOA: 01/15/2023  PCP: Jackelyn Poling, DO   Patient coming from: Home  Chief Complaint: Worsening foot wound  HPI: Connie Vaughn is a 47 y.o. female with medical history significant of diabetes type 1, neuropathy, PTSD, ADHD, hyperlipidemia, GERD, anemia presenting with worsening foot wound.  Patient tried to clip some hard skin of the medial aspect of her left great toe several weeks ago.  Since that time the wound has been forming and growing.  Day prior to presentation she noticed increased swelling and spreading erythema that is now up to her ankle.  In the area has become more tender.  She denies fevers, chills, chest pain, shortness breath, abdominal pain, constipation, diarrhea, nausea, vomiting.  ED Course: Vital signs in the ED notable for blood pressure in the 90s to 160s systolic, heart rate in the 80s to 100s.  Lab workup included CMP with chloride 95, creatinine stable 1.83, glucose 308, albumin 3.3.  CBC within normal limits.  X-ray showed changes that may be consistent with osteomyelitis.  Patient started on insulin and received IV fluids in the ED.  Review of Systems: As per HPI otherwise all other systems reviewed and are negative.  Past Medical History:  Diagnosis Date   ADHD (attention deficit hyperactivity disorder)    AKI (acute kidney injury) (HCC) 10/06/2020   Anemia    Diabetes mellitus type 1, uncontrolled    DKA (diabetic ketoacidosis) (HCC) 10/06/2020   GERD (gastroesophageal reflux disease)    Incontinence, feces     Past Surgical History:  Procedure Laterality Date   I & D EXTREMITY Right 03/09/2021   Procedure: IRRIGATION AND DEBRIDEMENT RIGHT RING FINGER;  Surgeon: Betha Loa, MD;  Location: [facility] OR;  Service: Orthopedics;  Laterality: Right;   TUBAL LIGATION      Social History  reports that she has been smoking cigarettes. She has a 0.5 pack-year smoking history. She has never  used smokeless tobacco. She reports current drug use. Drug: Marijuana. She reports that she does not drink alcohol.  Allergies  Allergen Reactions   Sulfa Antibiotics Hives    Family History  Problem Relation Age of Onset   Heart disease Mother    Congestive Heart Failure Mother    Congestive Heart Failure Sister    Diabetes Maternal Grandmother   Reviewed on admission  Prior to Admission medications   Medication Sig Start Date End Date Taking? Authorizing Provider  Continuous Glucose Sensor (FREESTYLE LIBRE 3 SENSOR) MISC 1 each every 14 (fourteen) days.   Yes [provider]  insulin degludec (TRESIBA FLEXTOUCH) 100 UNIT/ML FlexTouch Pen Inject 18 Units into the skin at bedtime. 08/22/19  Yes [provider]  LYUMJEV KWIKPEN 100 UNIT/ML KwikPen Inject 10-12 Units into the skin 2 (two) times daily. 04/03/21  Yes [provider]  D3-50 1.25 MG (50000 UT) capsule Take 50,000 Units by mouth 3 (three) times a week. 03/06/21   [provider]  glucose blood test strip Use as instructed 11/18/17   [name], NP  insulin aspart (NOVOLOG) 100 UNIT/ML FlexPen Inject 10-12 Units into the skin 3 (three) times daily with meals. Patient not taking: Reported on 07/28/2021 11/18/17   [name], NP  Insulin Pen Needle (B-D UF III MINI PEN NEEDLES) 31G X 5 MM MISC Use as instructed 11/18/17   [name], NP  Lancets MISC by Does not apply route. 07/09/13   [provider]  Physical Exam: Vitals:   01/15/23 0600 01/15/23 0921 01/15/23 0923 01/15/23 1252  BP: (!) 163/97 134/78  139/84  Pulse: 90 88  90  Resp: 18   16  Temp:   98 F (36.7 C) 97.7 F (36.5 C)  TempSrc:   Oral Oral  SpO2: 100% 99%  100%  Weight:      Height:        Physical Exam Constitutional:      General: She is not in acute distress.    Appearance: Normal appearance.  HENT:     Head: Normocephalic and atraumatic.     Mouth/Throat:     Mouth: Mucous membranes  are moist.     Pharynx: Oropharynx is clear.  Eyes:     Extraocular Movements: Extraocular movements intact.     Pupils: Pupils are equal, round, and reactive to light.  Cardiovascular:     Rate and Rhythm: Normal rate and regular rhythm.     Pulses: Normal pulses.     Heart sounds: Normal heart sounds.  Pulmonary:     Effort: Pulmonary effort is normal. No respiratory distress.     Breath sounds: Normal breath sounds.  Abdominal:     General: Bowel sounds are normal. There is no distension.     Palpations: Abdomen is soft.     Tenderness: There is no abdominal tenderness.  Musculoskeletal:        General: Swelling and tenderness present. No deformity.     Comments: Spreading erythema, warmth, edema at medial aspect of left foot.  Erythema spreading up to at least patient ankle.  Skin:    General: Skin is warm and dry.  Neurological:     General: No focal deficit present.     Mental Status: Mental status is at baseline.   7/17  7/18 (worsening edema)   Labs on Admission: I have personally reviewed following labs and imaging studies  CBC: Recent Labs  Lab 01/14/23 2104  WBC 8.7  NEUTROABS 5.2  HGB 12.3  HCT 37.7  MCV 89.3  PLT 361    Basic Metabolic Panel: Recent Labs  Lab 01/14/23 2104  NA 137  K 3.8  CL 95*  CO2 28  GLUCOSE 308*  BUN 20  CREATININE 1.03*  CALCIUM 9.8    GFR: Estimated Creatinine Clearance: 58.9 mL/min (A) (by C-G formula based on SCr of 1.03 mg/dL (H)).  Liver Function Tests: Recent Labs  Lab 01/14/23 2104  AST 18  ALT 13  ALKPHOS 97  BILITOT 0.3  PROT 7.0  ALBUMIN 3.3*    Urine analysis:    Component Value Date/Time   COLORURINE COLORLESS (A) 11/14/2022 0401   APPEARANCEUR CLEAR 11/14/2022 0401   LABSPEC 1.012 11/14/2022 0401   PHURINE 6.0 11/14/2022 0401   GLUCOSEU >1,000 (A) 11/14/2022 0401   HGBUR MODERATE (A) 11/14/2022 0401   BILIRUBINUR NEGATIVE 11/14/2022 0401   KETONESUR NEGATIVE 11/14/2022 0401   PROTEINUR  TRACE (A) 11/14/2022 0401   NITRITE NEGATIVE 11/14/2022 0401   LEUKOCYTESUR NEGATIVE 11/14/2022 0401    Radiological Exams on Admission: DG Foot Complete Left  Result Date: 01/14/2023 CLINICAL DATA:  Big toe wound in the diabetic. EXAM: LEFT FOOT - COMPLETE 3+ VIEW COMPARISON:  None Available. FINDINGS: Mild hallux valgus deformity. Subcortical cystic change with evidence of focal erosion in the anterolateral aspect of the metatarsal head. This may indicate focal osteomyelitis. Mild soft tissue swelling. No acute fracture or dislocation. IMPRESSION: Cortical erosion and cystic changes in the first  metatarsal head may indicate osteomyelitis. Electronically Signed   By: Burman Nieves M.D.   On: 01/14/2023 21:24    EKG: Not performed in emergency department  Assessment/Plan Principal Problem:   Osteomyelitis of great toe of left foot (HCC) Active Problems:   Hyperlipidemia   Diabetic polyneuropathy associated with type 1 diabetes mellitus (HCC)   Type 1 diabetes mellitus with hyperglycemia (HCC)   Chronic post-traumatic stress disorder (PTSD)  Osteomyelitis of left great toe Diabetic foot Cellulitis > Worsening wound on the left great toe after clipping blister/hard skin from the area. > Now with x-ray showing possible osteomyelitis.  Remains afebrile without in the leukocytosis. > Transferred for MRI to confirm osteomyelitis. > Consult orthopedics, likely need to follow-up outpatient after discharge based on surgery schedule if she remained stable. > Will start with IV antibiotics considering recent rapid worsening of her erythema, edema and pain in the last day and monitor for improvement for hopefully being able to discharge tomorrow. - Continue to monitor on MedSurg unit - Ceftriaxone and Flagyl - Trend fever curve and WBC - MRI left foot  Type 1 diabetes > 18 units nightly and sliding scale at home - Continue with 10 units nightly and SSI  DVT prophylaxis: SCDs Code  Status:   Full Family Communication:  None on admission  Disposition Plan:   Patient is from:  Home  Anticipated DC to:  Home  Anticipated DC date:  1 to 2 days  Anticipated DC barriers: None  Consults called:  Orthopedics Admission status:  Observation, MedSurg  Severity of Illness: The appropriate patient status for this patient is OBSERVATION. Observation status is judged to be reasonable and necessary in order to provide the required intensity of service to ensure the patient's safety. The patient's presenting symptoms, physical exam findings, and initial radiographic and laboratory data in the context of their medical condition is felt to place them at decreased risk for further clinical deterioration. Furthermore, it is anticipated that the patient will be medically stable for discharge from the hospital within 2 midnights of admission.    Synetta Fail MD [facility]  How to contact the [facility] Attending or Consulting provider 7A - 7P or covering provider during after hours 7P -7A, for this patient?   Check the care team in [facility] and look for a) attending/consulting [facility] provider listed and b) the [facility] team listed Log into [email or web address] and use [facility] universal password to access. If you do not have the password, please contact the hospital operator. Locate the [facility] provider you are looking for under [facility] and page to a number that you can be directly reached. If you still have difficulty reaching the provider, please page the [facility] (Director on Call) for the Hospitalists listed on amion for assistance.  01/15/2023, 1:21 PM

## 2023-01-15 NOTE — ED Notes (Signed)
Quianna at [facility] will send transport. Bed Ready at [facility] 2W Rm#[phone](NS)

## 2023-01-15 NOTE — ED Triage Notes (Signed)
Pt reporting wound on the left great toe that has been there for weeks. Reports increased swelling and redness to the foot and toe. Diabetic

## 2023-01-15 NOTE — Progress Notes (Signed)
   Patient Name: Connie Vaughn, Connie Vaughn DOB: [birth year] MRN: [id number] Transferring facility: [facility] Requesting provider: cardama, md Reason for transfer: left diabetic foot wound 47 yo female with hx of type 1 DM, with left 1st toe cellulitis. xray shows  1st toe osteo. Going to: [facility] Admission Status: obs Bed Type: meds/surg To Do: will need podiatry consult.  [facility] will assume care on arrival to accepting facility. Until arrival, medical decision making responsibilities remain with the EDP.  However, [facility] available 24/7 for questions and assistance.   Nursing staff please page [facility] Admits and Consults [phone]) as soon as the patient arrives to the hospital.  Carollee Herter, DO [facility]

## 2023-01-15 NOTE — ED Notes (Signed)
Pt stated her blood glucose was low and she wanted "something to bring it back up". Pt refused glucometer check. Per pt blood sugar was 68 @ 0902. Peanut butter crackers and water given.

## 2023-01-15 NOTE — Consult Note (Signed)
Reason for Consult:Left foot infection Referring Physician: Beola Cord Time called: 1348 Time at bedside: [address] Connie Vaughn is an 47 y.o. female.  HPI: Connie Vaughn came to [facility] yesterday with a left great toe infection. It had begun 3w ago when she tried to remove some hard skin but didn't swell and get painful until yesterday. X-ray showed possible osteo and she was transferred to [facility] for further evaluation. She denies prior hx/o similar or fevers, chills, sweats, N/V.  Past Medical History:  Diagnosis Date   ADHD (attention deficit hyperactivity disorder)    AKI (acute kidney injury) (HCC) 10/06/2020   Anemia    Diabetes mellitus type 1, uncontrolled    DKA (diabetic ketoacidosis) (HCC) 10/06/2020   GERD (gastroesophageal reflux disease)    Incontinence, feces     Past Surgical History:  Procedure Laterality Date   I & D EXTREMITY Right 03/09/2021   Procedure: IRRIGATION AND DEBRIDEMENT RIGHT RING FINGER;  Surgeon: Betha Loa, MD;  Location: [facility] OR;  Service: Orthopedics;  Laterality: Right;   TUBAL LIGATION      Family History  Problem Relation Age of Onset   Heart disease Mother    Congestive Heart Failure Mother    Congestive Heart Failure Sister    Diabetes Maternal Grandmother     Social History:  reports that she has been smoking cigarettes. She has a 0.5 pack-year smoking history. She has never used smokeless tobacco. She reports current drug use. Drug: Marijuana. She reports that she does not drink alcohol.  Allergies:  Allergies  Allergen Reactions   Sulfa Antibiotics Hives    Medications: I have reviewed the patient's current medications.  Results for orders placed or performed during the hospital encounter of 01/14/23 (from the past 48 hour(s))  CBG monitoring, ED     Status: Abnormal   Collection Time: 01/14/23  8:55 PM  Result Value Ref Range   Glucose-Capillary 280 (H) 70 - 99 mg/dL    Comment: Glucose reference range applies only to samples taken  after fasting for at least 8 hours.  CBC with Differential     Status: None   Collection Time: 01/14/23  9:04 PM  Result Value Ref Range   WBC 8.7 4.0 - 10.5 K/uL   RBC 4.22 3.87 - 5.11 MIL/uL   Hemoglobin 12.3 12.0 - 15.0 g/dL   HCT 13.2 44.0 - 10.2 %   MCV 89.3 80.0 - 100.0 fL   MCH 29.1 26.0 - 34.0 pg   MCHC 32.6 30.0 - 36.0 g/dL   RDW 72.5 36.6 - 44.0 %   Platelets 361 150 - 400 K/uL   nRBC 0.0 0.0 - 0.2 %   Neutrophils Relative % 60 %   Neutro Abs 5.2 1.7 - 7.7 K/uL   Lymphocytes Relative 29 %   Lymphs Abs 2.5 0.7 - 4.0 K/uL   Monocytes Relative 8 %   Monocytes Absolute 0.7 0.1 - 1.0 K/uL   Eosinophils Relative 2 %   Eosinophils Absolute 0.2 0.0 - 0.5 K/uL   Basophils Relative 1 %   Basophils Absolute 0.1 0.0 - 0.1 K/uL   Immature Granulocytes 0 %   Abs Immature Granulocytes 0.02 0.00 - 0.07 K/uL    Comment: Performed at [facility], [address] [place], [place] [address]  Comprehensive metabolic panel     Status: Abnormal   Collection Time: 01/14/23  9:04 PM  Result Value Ref Range   Sodium 137 135 - 145 mmol/L  Potassium 3.8 3.5 - 5.1 mmol/L   Chloride 95 (L) 98 - 111 mmol/L   CO2 28 22 - 32 mmol/L   Glucose, Bld 308 (H) 70 - 99 mg/dL    Comment: Glucose reference range applies only to samples taken after fasting for at least 8 hours.   BUN 20 6 - 20 mg/dL   Creatinine, Ser 0.26 (H) 0.44 - 1.00 mg/dL   Calcium 9.8 8.9 - 37.8 mg/dL   Total Protein 7.0 6.5 - 8.1 g/dL   Albumin 3.3 (L) 3.5 - 5.0 g/dL   AST 18 15 - 41 U/L   ALT 13 0 - 44 U/L   Alkaline Phosphatase 97 38 - 126 U/L   Total Bilirubin 0.3 0.3 - 1.2 mg/dL   GFR, Estimated >58 >85 mL/min    Comment: (NOTE) Calculated using the CKD-EPI Creatinine Equation (2021)    Anion gap 14 5 - 15    Comment: Performed at [facility], [address], [place], [place] [address]    DG Foot Complete Left  Result Date: 01/14/2023 CLINICAL DATA:  Big toe wound in the diabetic. EXAM: LEFT FOOT -  COMPLETE 3+ VIEW COMPARISON:  None Available. FINDINGS: Mild hallux valgus deformity. Subcortical cystic change with evidence of focal erosion in the anterolateral aspect of the metatarsal head. This may indicate focal osteomyelitis. Mild soft tissue swelling. No acute fracture or dislocation. IMPRESSION: Cortical erosion and cystic changes in the first metatarsal head may indicate osteomyelitis. Electronically Signed   By: Burman Nieves M.D.   On: 01/14/2023 21:24    Review of Systems  Constitutional:  Negative for chills, diaphoresis and fever.  HENT:  Negative for ear discharge, ear pain, hearing loss and tinnitus.   Eyes:  Negative for photophobia and pain.  Respiratory:  Negative for cough and shortness of breath.   Cardiovascular:  Negative for chest pain.  Gastrointestinal:  Negative for abdominal pain, nausea and vomiting.  Genitourinary:  Negative for dysuria, flank pain, frequency and urgency.  Musculoskeletal:  Positive for arthralgias (Left great toe/foot). Negative for back pain, myalgias and neck pain.  Neurological:  Negative for dizziness and headaches.  Hematological:  Does not bruise/bleed easily.  Psychiatric/Behavioral:  The patient is not nervous/anxious.    Blood pressure 139/84, pulse 90, temperature 97.7 F (36.5 C), temperature source Oral, resp. rate 16, height 5\' 4"  (1.626 m), weight 63 kg, SpO2 100%. Physical Exam Constitutional:      General: She is not in acute distress.    Appearance: She is well-developed. She is not diaphoretic.  HENT:     Head: Normocephalic and atraumatic.  Eyes:     General: No scleral icterus.       Right eye: No discharge.        Left eye: No discharge.     Conjunctiva/sclera: Conjunctivae normal.  Cardiovascular:     Rate and Rhythm: Normal rate and regular rhythm.  Pulmonary:     Effort: Pulmonary effort is normal. No respiratory distress.  Musculoskeletal:     Cervical back: Normal range of motion.  Feet:     Comments:  LLE No traumatic wounds, ecchymosis, or rash  Shallow ulceration medial great toe  No knee or ankle effusion  Sens SPN, TN intact, DPN mildly paresthetic  Motor EHL, ext, flex, evers 5/5  DP 1+, PT 2+, 1+ NP edema Skin:    General: Skin is warm and dry.  Neurological:     Mental Status: She is alert.  Psychiatric:        Mood and Affect: Mood normal.        Behavior: Behavior normal.     Assessment/Plan: Left great toe infection -- Will await MRI and ABI's. Dr. Lajoyce Corners to evaluate later today or in AM.    Freeman Caldron, PA-C Orthopedic Surgery [phone] 01/15/2023, 2:36 PM

## 2023-01-15 NOTE — ED Provider Notes (Signed)
[place] EMERGENCY DEPARTMENT AT [facility] Provider Note  CSN: [id number] Arrival date & time: 01/15/23 0358  Chief Complaint(s) Foot Swelling  HPI Connie Vaughn is a 47 y.o. female with a past medical history listed below including type 1 diabetes here for left foot wound and redness.  Patient reported that she tried to clip off hard skin on the medial aspect of her great toe 3 weeks ago.  Over that timeframe, she noted a wound that persisted and continued to grow.  She noted skin changes and developed swelling yesterday.  She began having pain as well.  She noted redness earlier today which has quickly spread from the toe medially to her ankle.  It is tender to touch.  She denies any fevers or chills.  No other physical complaints.  Patient initially presented to [facility] emergency department but left after triage due to the prolonged wait times.  At [facility] labs and imaging had already been obtained and will be noted below.  HPI  Past Medical History Past Medical History:  Diagnosis Date   ADHD (attention deficit hyperactivity disorder)    Anemia    Diabetes mellitus type 1, uncontrolled    GERD (gastroesophageal reflux disease)    Incontinence, feces    Patient Active Problem List   Diagnosis Date Noted   DKA (diabetic ketoacidosis) (HCC) 10/06/2020   AKI (acute kidney injury) (HCC) 10/06/2020   HLD (hyperlipidemia) 10/06/2020   DKA, type 1 (HCC) 10/16/2017   Diabetic ketoacidosis without coma associated with type 1 diabetes mellitus (HCC)    Home Medication(s) Prior to Admission medications   Medication Sig Start Date End Date Taking? Authorizing Provider  D3-50 1.25 MG (50000 UT) capsule Take 50,000 Units by mouth 3 (three) times a week. 03/06/21   [provider]  glucose blood test strip Use as instructed 11/18/17   [name], NP  insulin aspart (NOVOLOG) 100 UNIT/ML FlexPen Inject 10-12 Units into the skin 3 (three) times daily with  meals. Patient not taking: Reported on 07/28/2021 11/18/17   [name], NP  insulin degludec (TRESIBA FLEXTOUCH) 100 UNIT/ML FlexTouch Pen Inject 18 Units into the skin at bedtime. 08/22/19   [provider]  Insulin Pen Needle (B-D UF III MINI PEN NEEDLES) 31G X 5 MM MISC Use as instructed 11/18/17   [name], NP  Lancets MISC by Does not apply route. 07/09/13   [provider]  LYUMJEV KWIKPEN 100 UNIT/ML KwikPen Inject 10-12 Units into the skin 2 (two) times daily. 04/03/21   [provider]                                                                                                                                    Allergies Sulfa antibiotics  Review of Systems Review of Systems As noted in HPI  Physical Exam Vital Signs  I have reviewed the triage vital  signs BP (!) 163/97   Pulse 90   Temp 97.9 F (36.6 C) (Oral)   Resp 18   Ht 5\' 4"  (1.626 m)   Wt 61.2 kg   SpO2 100%   BMI 23.16 kg/m   Physical Exam Vitals reviewed.  Constitutional:      General: She is not in acute distress.    Appearance: She is well-developed. She is not diaphoretic.  HENT:     Head: Normocephalic and atraumatic.     Right Ear: External ear normal.     Left Ear: External ear normal.     Nose: Nose normal.  Eyes:     General: No scleral icterus.    Conjunctiva/sclera: Conjunctivae normal.  Neck:     Trachea: Phonation normal.  Cardiovascular:     Rate and Rhythm: Normal rate and regular rhythm.     Pulses:          Dorsalis pedis pulses are 2+ on the right side and 2+ on the left side.  Pulmonary:     Effort: Pulmonary effort is normal. No respiratory distress.     Breath sounds: No stridor.  Abdominal:     General: There is no distension.  Musculoskeletal:        General: Normal range of motion.     Cervical back: Normal range of motion.  Feet:     Right foot:     Skin integrity: Dry skin present.     Left foot:     Skin integrity: Skin  breakdown, erythema, warmth and dry skin present.  Neurological:     Mental Status: She is alert and oriented to person, place, and time.  Psychiatric:        Behavior: Behavior normal.          ED Results and Treatments Labs (all labs ordered are listed, but only abnormal results are displayed) Labs Reviewed - No data to display                                                                                                                       EKG  EKG Interpretation Date/Time:    Ventricular Rate:    PR Interval:    QRS Duration:    QT Interval:    QTC Calculation:   R Axis:      Text Interpretation:         Radiology DG Foot Complete Left  Result Date: 01/14/2023 CLINICAL DATA:  Big toe wound in the diabetic. EXAM: LEFT FOOT - COMPLETE 3+ VIEW COMPARISON:  None Available. FINDINGS: Mild hallux valgus deformity. Subcortical cystic change with evidence of focal erosion in the anterolateral aspect of the metatarsal head. This may indicate focal osteomyelitis. Mild soft tissue swelling. No acute fracture or dislocation. IMPRESSION: Cortical erosion and cystic changes in the first metatarsal head may indicate osteomyelitis. Electronically Signed   By: Burman Nieves M.D.   On: 01/14/2023 21:24    Medications Ordered in ED Medications  sodium chloride 0.9 % bolus 1,000 mL (has no administration in time range)    Followed by  0.9 %  sodium chloride infusion (has no administration in time range)   Procedures Procedures  (including critical care time) Medical Decision Making / ED Course   Medical Decision Making Amount and/or Complexity of Data Reviewed Labs: ordered. Decision-making details documented in ED Course. Radiology: ordered and independent interpretation performed. Decision-making details documented in ED Course.    Left foot pain and swelling  Exam is consistent with cellulitis related to foot wound.  Plain film from [facility] noted cortical  irregularity at the head of the first metatarsal concerning for osteomyelitis.  Given the proximity to the wound, will need to rule this out.  CBC without leukocytosis or anemia.  CMP without significant electrolyte derangements or renal insufficiency.  Hyperglycemia without DKA.  Will discuss admission with hospitalist service.      Final Clinical Impression(s) / ED Diagnoses Final diagnoses:  Infected wound  Cellulitis of left lower extremity    This chart was dictated using voice recognition software.  Despite best efforts to proofread,  errors can occur which can change the documentation meaning.    Nira Conn, MD 01/15/23 [phone]

## 2023-01-15 NOTE — Inpatient Diabetes Management (Signed)
Inpatient Diabetes Program Recommendations  AACE/ADA: New Consensus Statement on Inpatient Glycemic Control (2015)  Target Ranges:  Prepandial:   less than 140 mg/dL      Peak postprandial:   less than 180 mg/dL (1-2 hours)      Critically ill patients:  140 - 180 mg/dL   Lab Results  Component Value Date   GLUCAP 280 (H) 01/14/2023   HGBA1C 11.1 (H) 10/07/2020    Review of Glycemic Control  Latest Reference Range & Units 01/14/23 20:55  Glucose-Capillary 70 - 99 mg/dL 161 (H)   Diabetes history: DM type 1 (requires basal and bolus insulin for correction and meal coverage) Outpatient Diabetes medications: Tresiba 18 units qhs, Lyumjev (lisro/humalog equivalent) 10-12 units tid Current orders for Inpatient glycemic control:  Novolog 0-9 units tid + hs  A1c ordered  Inpatient Diabetes Program Recommendations:    Note: hypoglycemia of 68 this am at 0900 Pt has type 1 diabetes and will require basal insulin when glucose trends are consistently above 180 mg/dl to prevent DKA, Pt on Tresiba basal insulin at home with a very long half life of 42 hours.  Will watch glucose trends  Thanks,  Christena Deem RN, MSN, BC-ADM Inpatient Diabetes Coordinator Team Pager [phone] (8a-5p)

## 2023-01-15 NOTE — Progress Notes (Signed)
Patient is getting her own blood glucose sensor reading and taking her own insulin. She refused to have home insuline to be verified and given by RN. Made the provider and charge nurse aware.

## 2023-01-15 NOTE — ED Notes (Signed)
Pt stated she was leaving and would follow up with her doctor IV removed , pt ambulatory walked out

## 2023-01-15 NOTE — ED Notes (Signed)
Notified that pt has arrives to another hospital for evaluation.

## 2023-01-16 ENCOUNTER — Observation Stay (HOSPITAL_COMMUNITY): Payer: Managed Care, Other (non HMO)

## 2023-01-16 DIAGNOSIS — E11628 Type 2 diabetes mellitus with other skin complications: Secondary | ICD-10-CM | POA: Diagnosis not present

## 2023-01-16 DIAGNOSIS — L089 Local infection of the skin and subcutaneous tissue, unspecified: Secondary | ICD-10-CM | POA: Diagnosis not present

## 2023-01-16 DIAGNOSIS — M869 Osteomyelitis, unspecified: Secondary | ICD-10-CM | POA: Diagnosis not present

## 2023-01-16 DIAGNOSIS — L03119 Cellulitis of unspecified part of limb: Secondary | ICD-10-CM | POA: Diagnosis not present

## 2023-01-16 DIAGNOSIS — L03116 Cellulitis of left lower limb: Principal | ICD-10-CM

## 2023-01-16 DIAGNOSIS — E1065 Type 1 diabetes mellitus with hyperglycemia: Secondary | ICD-10-CM | POA: Diagnosis not present

## 2023-01-16 DIAGNOSIS — T148XXA Other injury of unspecified body region, initial encounter: Secondary | ICD-10-CM | POA: Diagnosis not present

## 2023-01-16 LAB — CBC
HCT: 33 % — ABNORMAL LOW (ref 36.0–46.0)
HCT: 35.1 % — ABNORMAL LOW (ref 36.0–46.0)
Hemoglobin: 10.8 g/dL — ABNORMAL LOW (ref 12.0–15.0)
Hemoglobin: 11.4 g/dL — ABNORMAL LOW (ref 12.0–15.0)
MCH: 29.2 pg (ref 26.0–34.0)
MCH: 29 pg (ref 26.0–34.0)
MCHC: 32.7 g/dL (ref 30.0–36.0)
MCHC: 32.5 g/dL (ref 30.0–36.0)
MCV: 89.2 fL (ref 80.0–100.0)
MCV: 89.3 fL (ref 80.0–100.0)
Platelets: 286 10*3/uL (ref 150–400)
Platelets: 324 10*3/uL (ref 150–400)
RBC: 3.7 MIL/uL — ABNORMAL LOW (ref 3.87–5.11)
RBC: 3.93 MIL/uL (ref 3.87–5.11)
RDW: 14 % (ref 11.5–15.5)
RDW: 13.8 % (ref 11.5–15.5)
WBC: 7.1 10*3/uL (ref 4.0–10.5)
WBC: 8.2 10*3/uL (ref 4.0–10.5)
nRBC: 0 % (ref 0.0–0.2)
nRBC: 0 % (ref 0.0–0.2)

## 2023-01-16 LAB — BASIC METABOLIC PANEL
Anion gap: 4 — ABNORMAL LOW (ref 5–15)
BUN: 19 mg/dL (ref 6–20)
CO2: 26 mmol/L (ref 22–32)
Calcium: 8 mg/dL — ABNORMAL LOW (ref 8.9–10.3)
Chloride: 108 mmol/L (ref 98–111)
Creatinine, Ser: 0.69 mg/dL (ref 0.44–1.00)
GFR, Estimated: >60 mL/min (ref >60)
Glucose, Bld: 56 mg/dL — ABNORMAL LOW (ref 70–99)
Potassium: 3.7 mmol/L (ref 3.5–5.1)
Sodium: 138 mmol/L (ref 135–145)

## 2023-01-16 LAB — GLUCOSE, CAPILLARY
Glucose-Capillary: 162 mg/dL — ABNORMAL HIGH (ref 70–99)
Glucose-Capillary: 176 mg/dL — ABNORMAL HIGH (ref 70–99)
Glucose-Capillary: 258 mg/dL — ABNORMAL HIGH (ref 70–99)
Glucose-Capillary: 311 mg/dL — ABNORMAL HIGH (ref 70–99)
Glucose-Capillary: 317 mg/dL — ABNORMAL HIGH (ref 70–99)

## 2023-01-16 LAB — C-REACTIVE PROTEIN: CRP: 0.8 mg/dL (ref <1.0)

## 2023-01-16 LAB — HEMOGLOBIN A1C
Hgb A1c MFr Bld: 11.4 % — ABNORMAL HIGH (ref 4.8–5.6)
Mean Plasma Glucose: 280.48 mg/dL

## 2023-01-16 LAB — URIC ACID: Uric Acid, Serum: 4.2 mg/dL (ref 2.5–7.1)

## 2023-01-16 LAB — SEDIMENTATION RATE: Sed Rate: 50 mm/hr — ABNORMAL HIGH (ref 0–22)

## 2023-01-16 MED ORDER — DOXYCYCLINE HYCLATE 100 MG PO CAPS: 100.0000 mg | ORAL_CAPSULE | Freq: Two times a day (BID) | ORAL | 0 refills | Status: AC

## 2023-01-16 MED ORDER — INSULIN ASPART 100 UNIT/ML IJ SOLN
6.0000 [IU] | Freq: Three times a day (TID) | INTRAMUSCULAR | Status: DC
  Administered 2023-01-16: 6 [IU] via SUBCUTANEOUS

## 2023-01-16 MED ORDER — CIPROFLOXACIN HCL 500 MG PO TABS: 500.0000 mg | ORAL_TABLET | Freq: Two times a day (BID) | ORAL | 0 refills | Status: AC

## 2023-01-16 MED ORDER — ONDANSETRON HCL 4 MG PO TABS: 4.0000 mg | ORAL_TABLET | Freq: Three times a day (TID) | ORAL | 0 refills | Status: DC | PRN

## 2023-01-16 NOTE — Progress Notes (Addendum)
Patient refused to have fluids (N/S). And she is not happy with the CBG reading in the hospital. She had taken her own insulin in the morning. Just explained again about the hospital policy.

## 2023-01-16 NOTE — Discharge Summary (Signed)
Physician Discharge Summary   Patient: Connie Vaughn MRN: [id number] DOB: [birth year]  Admit date:     01/15/2023  Discharge date: 01/16/23  Discharge Physician: Thad Ranger, MD    PCP: Jackelyn Poling, DO   Recommendations at discharge:    Placed on doxycyline 100mg  BID x 10days  Continue Ciprofloxacin 500mg  BID x 10days  Outpatient f/u with Dr Lajoyce Corners in 1 week    Discharge Diagnoses:   Cellulitis of the foot and great toe of left foot (HCC)    Type 1 diabetes mellitus with hyperglycemia (HCC)   Cellulitis in diabetic foot [facility])   Hospital Course:   Patient is a 47 y.o. female with diabetes type 1, neuropathy, PTSD, ADHD, hyperlipidemia, GERD, anemia presented with worsening foot wound. Patient tried to clip some hard skin of the medial aspect of her left great toe several weeks ago.  Since that time the wound has been forming and growing.  A day prior to presentation, she noticed increased swelling and spreading erythema, now up to her ankle. In the area has become more tender. Denied fevers, chills, chest pain, shortness breath, abdominal pain.  X-ray showed changes that may be consistent with osteomyelitis.     Assessment and Plan:   Left foot cellulitis with left great toe wound.  -  Worsening wound on the left great toe after clipping blister/hard skin from the area. In the setting of uncontrolled diabetes.  -  x-ray showing possible osteomyelitis.  -  Patient was placed on IV rocephin, flagyl - ortho consulted and patient was seen by Dr Lajoyce Corners  - MRI left foot negative for OM, abscess or fluid collection - ABI's pending - Dr Lajoyce Corners recommended oral antibiotics, placed on doxycycline and ciprofloxacin for double coverage given type 1 DM. Outpatient follow-up with Dr Lajoyce Corners in 1 week.     Type 1 diabetes, uncontrolled with hyper and hypoglycemia - on 18 units nightly and sliding scale at home - Continue outpatient regimen          Pain control - [facility]  Controlled Substance Reporting System database was reviewed. and patient was instructed, not to drive, operate heavy machinery, perform activities at heights, swimming or participation in water activities or provide baby-sitting services while on Pain, Sleep and Anxiety Medications; until their outpatient Physician has advised to do so again. Also recommended to not to take more than prescribed Pain, Sleep and Anxiety Medications.  Consultants: ortho Procedures performed: none  Disposition: Home Diet recommendation:  Discharge Diet Orders (From admission, onward)     Start     Ordered   01/16/23 0000  Diet Carb Modified        01/16/23 1535           Carb modified diet DISCHARGE MEDICATION: Allergies as of 01/16/2023       Reactions   Sulfa Antibiotics Hives        Medication List     STOP taking these medications    insulin aspart 100 UNIT/ML FlexPen Commonly known as: NOVOLOG       TAKE these medications    ciprofloxacin 500 MG tablet Commonly known as: Cipro Take 1 tablet (500 mg total) by mouth 2 (two) times daily for 10 days.   D3-50 1.25 MG (50000 UT) capsule Generic drug: Cholecalciferol Take 50,000 Units by mouth 3 (three) times a week.   doxycycline 100 MG capsule Commonly known as: Vibramycin Take 1 capsule (100 mg total) by mouth 2 (two) times daily for  10 days.   FreeStyle Libre 3 Sensor Misc 1 each every 14 (fourteen) days.   glucose blood test strip Use as instructed   Insulin Pen Needle 31G X 5 MM Misc Commonly known as: B-D UF III MINI PEN NEEDLES Use as instructed   Lancets Misc by Does not apply route.   Lyumjev KwikPen 100 UNIT/ML KwikPen Generic drug: Insulin Lispro-aabc Inject 10-12 Units into the skin 2 (two) times daily.   ondansetron 4 MG tablet Commonly known as: Zofran Take 1 tablet (4 mg total) by mouth every 8 (eight) hours as needed for nausea or vomiting.   [name] FlexTouch 100 UNIT/ML FlexTouch Pen Generic drug:  insulin degludec Inject 18 Units into the skin at bedtime.        Follow-up Information     Nadara Mustard, MD Follow up in 1 week(s).   Specialty: Orthopedic Surgery Contact information: [address] [place] [address] [phone]         Jackelyn Poling, DO. Schedule an appointment as soon as possible for a visit in 2 week(s).   Specialty: Family Medicine Why: for hospital follow-up Contact information: [address] [place] [address] [phone]                Discharge Exam: Ceasar Mons Weights   01/15/23 0403 01/15/23 1252  Weight: 61.2 kg 63 kg   S: Patient anxiously wants to go home. She wants to go to work as well tomorrow.  BP 134/76   Pulse 83   Temp 98.2 F (36.8 C) (Oral)   Resp 18   Ht 5\' 4"  (1.626 m)   Wt 63 kg   SpO2 100%   BMI 23.84 kg/m   Physical Exam General: Alert and oriented x 3, NAD Cardiovascular: S1 S2 clear, RRR.  Respiratory: CTAB, no wheezing, rales or rhonchi Gastrointestinal: Soft, nontender, nondistended, NBS Ext: no pedal edema rt LE Neuro: no new deficits Skin: left foot cellulitis with swelling of great toe  Psych: Normal affect and demeanor, alert and oriented x3    Condition at discharge: fair  The results of significant diagnostics from this hospitalization (including imaging, microbiology, ancillary and laboratory) are listed below for reference.   Imaging Studies: MR FOOT LEFT W WO CONTRAST  Result Date: 01/16/2023 CLINICAL DATA:  Big toe wound, osteomyelitis suspected. EXAM: MRI OF THE LEFT FOREFOOT WITHOUT AND WITH CONTRAST TECHNIQUE: Multiplanar, multisequence MR imaging of the left was performed both before and after administration of intravenous contrast. CONTRAST:  6mL GADAVIST GADOBUTROL 1 MMOL/ML IV SOLN COMPARISON:  Radiographs dated January 14, 2023 FINDINGS: Bones/Joint/Cartilage Subchondral cystic changes of the first metatarsal head with mild edema. There are also degenerative changes of the  first interphalangeal joint with subchondral cyst. Marrow signal within the remaining osseous structures is within normal limits. No evidence of fracture or osteonecrosis. No evidence of acute osteomyelitis. Ligaments Lisfranc and collateral ligaments are intact. Muscles and Tendons Plantar muscles and tendons are unremarkable. Soft tissues Subcutaneous soft tissue edema about the dorsum of the foot. No fluid collection or abscess. IMPRESSION: 1. No evidence of acute osteomyelitis. 2. Subcutaneous soft tissue edema about the dorsum of the foot without fluid collection or abscess. 3. Degenerative changes of the first metatarsophalangeal and interphalangeal joints. Electronically Signed   By: Larose Hires D.O.   On: 01/16/2023 10:43   DG Foot Complete Left  Result Date: 01/14/2023 CLINICAL DATA:  Big toe wound in the diabetic. EXAM: LEFT FOOT - COMPLETE 3+ VIEW COMPARISON:  None Available. FINDINGS: Mild hallux valgus deformity. Subcortical cystic change with evidence of focal erosion in the anterolateral aspect of the metatarsal head. This may indicate focal osteomyelitis. Mild soft tissue swelling. No acute fracture or dislocation. IMPRESSION: Cortical erosion and cystic changes in the first metatarsal head may indicate osteomyelitis. Electronically Signed   By: Burman Nieves M.D.   On: 01/14/2023 21:24    Microbiology: Results for orders placed or performed during the hospital encounter of 03/09/21  Blood Culture (routine x 2)     Status: None   Collection Time: 03/09/21  5:49 AM   Specimen: BLOOD  Result Value Ref Range Status   Specimen Description BLOOD SITE NOT SPECIFIED  Final   Special Requests   Final    BOTTLES DRAWN AEROBIC AND ANAEROBIC Blood Culture adequate volume   Culture   Final    NO GROWTH 5 DAYS Performed at [facility], [address] [place], [place] [address]    Report Status 03/14/2021 FINAL  Final  Blood Culture (routine x 2)     Status: None   Collection Time:  03/09/21  6:17 AM   Specimen: BLOOD  Result Value Ref Range Status   Specimen Description BLOOD LEFT ARM  Final   Special Requests   Final    BOTTLES DRAWN AEROBIC AND ANAEROBIC Blood Culture adequate volume   Culture   Final    NO GROWTH 5 DAYS Performed at [facility], [address], [place], [place] [address]    Report Status 03/14/2021 FINAL  Final  SARS Coronavirus 2 by RT PCR (hospital order, performed in [facility] hospital lab)     Status: None   Collection Time: 03/09/21  7:57 AM  Result Value Ref Range Status   SARS Coronavirus 2 NEGATIVE NEGATIVE Final    Comment: (NOTE) SARS-CoV-2 target nucleic acids are NOT DETECTED.  The SARS-CoV-2 RNA is generally detectable in upper and lower respiratory specimens during the acute phase of infection. The lowest concentration of SARS-CoV-2 viral copies this assay can detect is 250 copies / mL. A negative result does not preclude SARS-CoV-2 infection and should not be used as the sole basis for treatment or other patient management decisions.  A negative result may occur with improper specimen collection / handling, submission of specimen other than nasopharyngeal swab, presence of viral mutation(s) within the areas targeted by this assay, and inadequate number of viral copies (<250 copies / mL). A negative result must be combined with clinical observations, patient history, and epidemiological information.  Fact Sheet for Patients:   BoilerBrush.com.cy  Fact Sheet for Healthcare Providers: https://pope.com/  This test is not yet approved or  cleared by the [place] FDA and has been authorized for detection and/or diagnosis of SARS-CoV-2 by FDA under an Emergency Use Authorization (EUA).  This EUA will remain in effect (meaning this test can be used) for the duration of the COVID-19 declaration under Section 564(b)(1) of the Act, 21 U.S.C. section 360bbb-3(b)(1),  unless the authorization is terminated or revoked sooner.  Performed at [facility], [address] [place], [place] [address]   Aerobic/Anaerobic Culture w Gram Stain (surgical/deep wound)     Status: None   Collection Time: 03/09/21  6:05 PM   Specimen: Hand, Right; Wound  Result Value Ref Range Status   Specimen Description WOUND  Final   Special Requests RIGHT RING FINGER FLEXOR SHEATH SPEC A  Final   Gram Stain   Final  ABUNDANT WBC PRESENT, PREDOMINANTLY PMN RARE GRAM POSITIVE COCCI    Culture   Final    FEW STAPHYLOCOCCUS AUREUS NO ANAEROBES ISOLATED Performed at [facility], [address] [place], [place] [address]    Report Status 03/15/2021 FINAL  Final   Organism ID, Bacteria STAPHYLOCOCCUS AUREUS  Final      Susceptibility   Staphylococcus aureus - MIC*    CIPROFLOXACIN <=0.5 SENSITIVE Sensitive     ERYTHROMYCIN <=0.25 SENSITIVE Sensitive     GENTAMICIN <=0.5 SENSITIVE Sensitive     OXACILLIN 0.5 SENSITIVE Sensitive     TETRACYCLINE <=1 SENSITIVE Sensitive     VANCOMYCIN <=0.5 SENSITIVE Sensitive     TRIMETH/SULFA <=10 SENSITIVE Sensitive     CLINDAMYCIN <=0.25 SENSITIVE Sensitive     RIFAMPIN <=0.5 SENSITIVE Sensitive     Inducible Clindamycin NEGATIVE Sensitive     * FEW STAPHYLOCOCCUS AUREUS    Labs: CBC: Recent Labs  Lab 01/14/23 2104 01/16/23 0310  WBC 8.7 7.1  NEUTROABS 5.2  --   HGB 12.3 10.8*  HCT 37.7 33.0*  MCV 89.3 89.2  PLT 361 286   Basic Metabolic Panel: Recent Labs  Lab 01/14/23 2104 01/15/23 2118 01/16/23 0310  NA 137 137 138  K 3.8 4.5 3.7  CL 95* 104 108  CO2 28 22 26   GLUCOSE 308* 154* 56*  BUN 20 20 19   CREATININE 1.03* 0.98 0.69  CALCIUM 9.8 8.4* 8.0*   Liver Function Tests: Recent Labs  Lab 01/14/23 2104  AST 18  ALT 13  ALKPHOS 97  BILITOT 0.3  PROT 7.0  ALBUMIN 3.3*   CBG: Recent Labs  Lab 01/14/23 2055 01/16/23 0011 01/16/23 0853 01/16/23 1213 01/16/23 1443  GLUCAP 280* 162*  258* 176* 311*    Discharge time spent: greater than 30 minutes.  Signed: Thad Ranger, MD [facility] 01/16/2023

## 2023-01-16 NOTE — Progress Notes (Signed)
[facility]                                                                              [name], is a 47 y.o. female, DOB - July 12, 1975, EAV:[id number] Admit date - 01/15/2023    Outpatient Primary MD for the patient is Connie Poling, DO  LOS - 0  days  Chief Complaint  Patient presents with   Foot Swelling       Brief summary   Patient is a 47 y.o. female with diabetes type 1, neuropathy, PTSD, ADHD, hyperlipidemia, GERD, anemia presented with worsening foot wound. Patient tried to clip some hard skin of the medial aspect of her left great toe several weeks ago.  Since that time the wound has been forming and growing.  A day prior to presentation, she noticed increased swelling and spreading erythema, now up to her ankle. In the area has become more tender. Denied fevers, chills, chest pain, shortness breath, abdominal pain.  X-ray showed changes that may be consistent with osteomyelitis.   Assessment & Plan    Principal Problem:   Osteomyelitis of great toe of left foot (HCC), cellulitis  -  Worsening wound on the left great toe after clipping blister/hard skin from the area. -  x-ray showing possible osteomyelitis.  -  Continue IV rocephin, flagyl - ortho consulted  - MRI left foot negative for OM, abscess or fluid collection - ABI's pending    Type 1 diabetes, uncontrolled with hyper and hypoglycemia - on 18 units nightly and sliding scale at home - Continue with 10 units nightly and SSI - per RN, patient using her own insulin and sensor.  - diabetic coordinator consult placed     Estimated body mass index is 23.84 kg/m as calculated from the following:   Height as of this encounter: 5\' 4"  (1.626 m).   Weight as of this encounter: 63 kg.  Code Status: full code  DVT Prophylaxis:  SCDs Start: 01/15/23 1320   Level of Care: Level of care: Med-Surg Family Communication: Updated patient Disposition Plan:      Remains inpatient appropriate:       Procedures:  MRI left foot   Consultants:   Ortho   Antimicrobials:   Anti-infectives (From admission, onward)    Start     Dose/Rate Route Frequency Ordered Stop   01/15/23 1600  metroNIDAZOLE (FLAGYL) IVPB 500 mg        500 mg 100 mL/hr over 60 Minutes Intravenous Every 12 hours 01/15/23 1347     01/15/23 1500  cefTRIAXone (ROCEPHIN) 2 g in sodium chloride 0.9 % 100 mL IVPB        2 g 200 mL/hr over 30 Minutes Intravenous Every 24 hours 01/15/23 1347            Medications  insulin aspart  0-5 Units Subcutaneous QHS   insulin aspart  0-9 Units Subcutaneous TID WC   insulin glargine-yfgn  10 Units Subcutaneous QHS   sodium chloride flush  3 mL Intravenous Q12H      Subjective:   Connie Vaughn was seen and examined today.  Left foot swollen  with bluish-grayish discoloration of great toe. No active drainage  Patient denies dizziness, chest pain, shortness of breath, abdominal pain, N/V. No fevers   Objective:   Vitals:   01/15/23 2049 01/16/23 0015 01/16/23 0422 01/16/23 0853  BP: (!) 145/86 132/72 111/70 (!) 147/86  Pulse: 99 85 82 86  Resp: 18 16 18 18   Temp: 98.2 F (36.8 C) 98.4 F (36.9 C) 100.1 F (37.8 C) 97.9 F (36.6 C)  TempSrc:  Oral    SpO2: 100% 100% 100% 100%  Weight:      Height:        Intake/Output Summary (Last 24 hours) at 01/16/2023 1217 Last data filed at 01/16/2023 2130 Gross per 24 hour  Intake 2569.51 ml  Output --  Net 2569.51 ml     Wt Readings from Last 3 Encounters:  01/15/23 63 kg  01/14/23 61.2 kg  07/28/21 59 kg     Exam General: Alert and oriented x 3, NAD Cardiovascular: S1 S2 auscultated,  RRR Respiratory: Clear to auscultation bilaterally, no wheezing Gastrointestinal: Soft, nontender, nondistended, + bowel sounds Ext: no pedal edema bilaterally Neuro: no new FND's Skin: left foot swollen upto ankle, bluish-grayish discoloration of great toe and swelling  Psych: Normal affect     Data Reviewed:  I  have personally reviewed following labs    CBC Lab Results  Component Value Date   WBC 7.1 01/16/2023   RBC 3.70 (L) 01/16/2023   HGB 10.8 (L) 01/16/2023   HCT 33.0 (L) 01/16/2023   MCV 89.2 01/16/2023   MCH 29.2 01/16/2023   PLT 286 01/16/2023   MCHC 32.7 01/16/2023   RDW 14.0 01/16/2023   LYMPHSABS 2.5 01/14/2023   MONOABS 0.7 01/14/2023   EOSABS 0.2 01/14/2023   BASOSABS 0.1 01/14/2023     Last metabolic panel Lab Results  Component Value Date   NA 138 01/16/2023   K 3.7 01/16/2023   CL 108 01/16/2023   CO2 26 01/16/2023   BUN 19 01/16/2023   CREATININE 0.69 01/16/2023   GLUCOSE 56 (L) 01/16/2023   GFRNONAA >60 01/16/2023   GFRAA >60 10/17/2017   CALCIUM 8.0 (L) 01/16/2023   PHOS 2.8 10/16/2017   PROT 7.0 01/14/2023   ALBUMIN 3.3 (L) 01/14/2023   BILITOT 0.3 01/14/2023   ALKPHOS 97 01/14/2023   AST 18 01/14/2023   ALT 13 01/14/2023   ANIONGAP 4 (L) 01/16/2023    CBG (last 3)  Recent Labs    01/16/23 0011 01/16/23 0853 01/16/23 1213  GLUCAP 162* 258* 176*      Coagulation Profile: No results for input(s): "INR", "PROTIME" in the last 168 hours.   Radiology Studies: I have personally reviewed the imaging studies  MR FOOT LEFT W WO CONTRAST  Result Date: 01/16/2023 CLINICAL DATA:  Big toe wound, osteomyelitis suspected. EXAM: MRI OF THE LEFT FOREFOOT WITHOUT AND WITH CONTRAST TECHNIQUE: Multiplanar, multisequence MR imaging of the left was performed both before and after administration of intravenous contrast. CONTRAST:  6mL GADAVIST GADOBUTROL 1 MMOL/ML IV SOLN COMPARISON:  Radiographs dated January 14, 2023 FINDINGS: Bones/Joint/Cartilage Subchondral cystic changes of the first metatarsal head with mild edema. There are also degenerative changes of the first interphalangeal joint with subchondral cyst. Marrow signal within the remaining osseous structures is within normal limits. No evidence of fracture or osteonecrosis. No evidence of acute osteomyelitis.  Ligaments Lisfranc and collateral ligaments are intact. Muscles and Tendons Plantar muscles and tendons are unremarkable. Soft tissues Subcutaneous soft tissue edema about the  dorsum of the foot. No fluid collection or abscess. IMPRESSION: 1. No evidence of acute osteomyelitis. 2. Subcutaneous soft tissue edema about the dorsum of the foot without fluid collection or abscess. 3. Degenerative changes of the first metatarsophalangeal and interphalangeal joints. Electronically Signed   By: Larose Hires D.O.   On: 01/16/2023 10:43   DG Foot Complete Left  Result Date: 01/14/2023 CLINICAL DATA:  Big toe wound in the diabetic. EXAM: LEFT FOOT - COMPLETE 3+ VIEW COMPARISON:  None Available. FINDINGS: Mild hallux valgus deformity. Subcortical cystic change with evidence of focal erosion in the anterolateral aspect of the metatarsal head. This may indicate focal osteomyelitis. Mild soft tissue swelling. No acute fracture or dislocation. IMPRESSION: Cortical erosion and cystic changes in the first metatarsal head may indicate osteomyelitis. Electronically Signed   By: Burman Nieves M.D.   On: 01/14/2023 21:24       Genaro Bekker M.D. [facility] 01/16/2023, 12:17 PM  Available via Epic secure chat 7am-7pm After 7 pm, please refer to night coverage provider listed on amion.

## 2023-01-16 NOTE — Inpatient Diabetes Management (Addendum)
Inpatient Diabetes Program Recommendations  AACE/ADA: New Consensus Statement on Inpatient Glycemic Control (2015)  Target Ranges:  Prepandial:   less than 140 mg/dL      Peak postprandial:   less than 180 mg/dL (1-2 hours)      Critically ill patients:  140 - 180 mg/dL   Lab Results  Component Value Date   GLUCAP 176 (H) 01/16/2023   HGBA1C 11.1 (H) 10/07/2020    Review of Glycemic Control  Diabetes history: type 1 Outpatient Diabetes medications: Tresiba 18 units at HS, Lyumjev Kwikpen 12 units with meals Current orders for Inpatient glycemic control: Semglee 10 units at HS, Novolog 0-9 units correction scale TID, Novolog 0-5 units HS scale  Inpatient Diabetes Program Recommendations:   Spoke with patient at length about her diabetes. She was diagnosed with type 2 diabetes when she was pregnant with her son at age 47. After baby was born, her medications changed and was diagnosed with type 1 diabetes. Patient is very knowledgeable on how her blood sugars should be and likes to keep them down as much as possible. She is wearing a Freestyle Libre 3 sensor and is able to compare the blood sugars frequently during the day.  Patient was very frustrated with not hearing any report from her MRI that was done last night.   Discussed with patient the hospital's policy on taking her own insulin. Patient seemed to be ok with taking insulin from the hospital instead of her own insulin. Recommended that the insulin regimen be changed to be closer to her home insulin regimen.   Current orders of insulin: Semglee 10 units at HS,  Novolog 0-9 units correction scale TID, Novolog 0-5 units HS scale   RECOMMEND ADDING: Novolog 6 units TID with meals starting at supper tonight  Smith Mince RN BSN CDE Diabetes Coordinator Pager: [phone]  8am-5pm     Patient was agreeable to this regimen. Will continue to monitor blood sugars through the weekend by on call diabetes coordinator.

## 2023-01-16 NOTE — Consult Note (Signed)
ORTHOPAEDIC CONSULTATION  REQUESTING PHYSICIAN: Cathren Harsh, MD  Chief Complaint: cellulitis and swelling left great toe  HPI: Connie Vaughn is a 47 y.o. female who presents with ulcer medial left great toe with swelling and cellulitis  Past Medical History:  Diagnosis Date   ADHD (attention deficit hyperactivity disorder)    AKI (acute kidney injury) (HCC) 10/06/2020   Anemia    Diabetes mellitus type 1, uncontrolled    DKA (diabetic ketoacidosis) (HCC) 10/06/2020   GERD (gastroesophageal reflux disease)    Incontinence, feces    Past Surgical History:  Procedure Laterality Date   I & D EXTREMITY Right 03/09/2021   Procedure: IRRIGATION AND DEBRIDEMENT RIGHT RING FINGER;  Surgeon: Betha Loa, MD;  Location: [facility] OR;  Service: Orthopedics;  Laterality: Right;   TUBAL LIGATION     Social History   Socioeconomic History   Marital status: Married    Spouse name: Not on file   Number of children: Not on file   Years of education: Not on file   Highest education level: Not on file  Occupational History   Not on file  Tobacco Use   Smoking status: Some Days    Current packs/day: 0.25    Average packs/day: 0.3 packs/day for 2.0 years (0.5 ttl pk-yrs)    Types: Cigarettes   Smokeless tobacco: Never   Tobacco comments:    also uses Black and Mild  Vaping Use   Vaping status: Never Used  Substance and Sexual Activity   Alcohol use: Never   Drug use: Yes    Types: Marijuana    Comment: edibles   Sexual activity: Not on file  Other Topics Concern   Not on file  Social History Narrative   Not on file   Social Determinants of Health   Financial Resource Strain: Medium Risk (08/18/2022)   Received from [facility], [facility]   Overall Financial Resource Strain (CARDIA)    Difficulty of Paying Living Expenses: Somewhat hard  Food Insecurity: No Food Insecurity (01/15/2023)   Hunger Vital Sign    Worried About Running Out of Food in the Last Year: Never  true    Ran Out of Food in the Last Year: Never true  Transportation Needs: No Transportation Needs (01/15/2023)   PRAPARE - Administrator, Civil Service (Medical): No    Lack of Transportation (Non-Medical): No  Physical Activity: Inactive (08/18/2022)   Received from [facility], [facility]   Exercise Vital Sign    Days of Exercise per Week: 0 days    Minutes of Exercise per Session: 60 min  Stress: Stress Concern Present (08/18/2022)   Received from [facility], [facility] - Occupational Stress Questionnaire    Feeling of Stress : To some extent  Social Connections: Somewhat Isolated (08/18/2022)   Received from [facility], [facility]   Social Network    How would you rate your social network (family, work, friends)?: Restricted participation with some degree of social isolation   Family History  Problem Relation Age of Onset   Heart disease Mother    Congestive Heart Failure Mother    Congestive Heart Failure Sister    Diabetes Maternal Grandmother    - negative except otherwise stated in the family history section Allergies  Allergen Reactions   Sulfa Antibiotics Hives   Prior to Admission medications   Medication Sig Start Date End Date Taking? Authorizing Provider  Continuous Glucose Sensor (FREESTYLE LIBRE 3 SENSOR) MISC 1 each every 14 (fourteen) days.   Yes [provider]  insulin degludec (TRESIBA FLEXTOUCH) 100 UNIT/ML FlexTouch Pen Inject 18 Units into the skin at bedtime. 08/22/19  Yes [provider]  LYUMJEV KWIKPEN 100 UNIT/ML KwikPen Inject 10-12 Units into the skin 2 (two) times daily. 04/03/21  Yes [provider]  D3-50 1.25 MG (50000 UT) capsule Take 50,000 Units by mouth 3 (three) times a week. 03/06/21   [provider]  glucose blood test strip Use as instructed 11/18/17   [name], NP  insulin aspart (NOVOLOG) 100 UNIT/ML FlexPen Inject 10-12  Units into the skin 3 (three) times daily with meals. Patient not taking: Reported on 07/28/2021 11/18/17   [name], NP  Insulin Pen Needle (B-D UF III MINI PEN NEEDLES) 31G X 5 MM MISC Use as instructed 11/18/17   [name], NP  Lancets MISC by Does not apply route. 07/09/13   [provider]   MR FOOT LEFT W WO CONTRAST  Result Date: 01/16/2023 CLINICAL DATA:  Big toe wound, osteomyelitis suspected. EXAM: MRI OF THE LEFT FOREFOOT WITHOUT AND WITH CONTRAST TECHNIQUE: Multiplanar, multisequence MR imaging of the left was performed both before and after administration of intravenous contrast. CONTRAST:  6mL GADAVIST GADOBUTROL 1 MMOL/ML IV SOLN COMPARISON:  Radiographs dated January 14, 2023 FINDINGS: Bones/Joint/Cartilage Subchondral cystic changes of the first metatarsal head with mild edema. There are also degenerative changes of the first interphalangeal joint with subchondral cyst. Marrow signal within the remaining osseous structures is within normal limits. No evidence of fracture or osteonecrosis. No evidence of acute osteomyelitis. Ligaments Lisfranc and collateral ligaments are intact. Muscles and Tendons Plantar muscles and tendons are unremarkable. Soft tissues Subcutaneous soft tissue edema about the dorsum of the foot. No fluid collection or abscess. IMPRESSION: 1. No evidence of acute osteomyelitis. 2. Subcutaneous soft tissue edema about the dorsum of the foot without fluid collection or abscess. 3. Degenerative changes of the first metatarsophalangeal and interphalangeal joints. Electronically Signed   By: Larose Hires D.O.   On: 01/16/2023 10:43   DG Foot Complete Left  Result Date: 01/14/2023 CLINICAL DATA:  Big toe wound in the diabetic. EXAM: LEFT FOOT - COMPLETE 3+ VIEW COMPARISON:  None Available. FINDINGS: Mild hallux valgus deformity. Subcortical cystic change with evidence of focal erosion in the anterolateral aspect of the metatarsal head. This may indicate  focal osteomyelitis. Mild soft tissue swelling. No acute fracture or dislocation. IMPRESSION: Cortical erosion and cystic changes in the first metatarsal head may indicate osteomyelitis. Electronically Signed   By: Burman Nieves M.D.   On: 01/14/2023 21:24   - pertinent xrays, CT, MRI studies were reviewed and independently interpreted  Positive ROS: All other systems have been reviewed and were otherwise negative with the exception of those mentioned in the HPI and as above.  Physical Exam: General: Alert, no acute distress Psychiatric: Patient is competent for consent with normal mood and affect Lymphatic: No axillary or cervical lymphadenopathy Cardiovascular: No pedal edema Respiratory: No cyanosis, no use of accessory musculature GI: No organomegaly, abdomen is soft and non-tender    Images:  @ENCIMAGES @  Labs:  Lab Results  Component Value Date   HGBA1C 11.1 (H) 10/07/2020   HGBA1C 10.8 (H) 10/06/2020   HGBA1C 11.8 (H) 10/16/2017   REPTSTATUS 03/15/2021 FINAL 03/09/2021   GRAMSTAIN  03/09/2021    ABUNDANT WBC PRESENT, PREDOMINANTLY PMN RARE GRAM POSITIVE COCCI  CULT  03/09/2021    FEW STAPHYLOCOCCUS AUREUS NO ANAEROBES ISOLATED Performed at [facility], [address], [place], [place] [address]    [facility] STAPHYLOCOCCUS AUREUS 03/09/2021    Lab Results  Component Value Date   ALBUMIN 3.3 (L) 01/14/2023   ALBUMIN 3.7 07/28/2021   ALBUMIN 3.9 03/27/2021        Latest Ref Rng & Units 01/16/2023    3:10 AM 01/14/2023    9:04 PM 11/14/2022    3:23 AM  CBC EXTENDED  WBC 4.0 - 10.5 K/uL 7.1  8.7    RBC 3.87 - 5.11 MIL/uL 3.70  4.22    Hemoglobin 12.0 - 15.0 g/dL 91.4  78.2  95.6   HCT 36.0 - 46.0 % 33.0  37.7  33.0   Platelets 150 - 400 K/uL 286  361    NEUT# 1.7 - 7.7 K/uL  5.2    Lymph# 0.7 - 4.0 K/uL  2.5      Neurologic: Patient does not have protective sensation bilateral lower extremities.   MUSCULOSKELETAL:   Skin: cellulitis is  improving compared to admission picture, no drainage.  Strong DP pulse  HgbA1c over 11 and no new values  MRI- no abscess, no osteomyelitis  Assessment: Swelling and cellulitis left great toe, without abscess and without cellulitis.  Plan: Continue antibiotics, patient is anxious to go home to work. Could discharge on oral antibiotics and I'll follow up in the office. I have ordered a uric acid, cystic changes in the bone could be consistent with gout   Thank you for the consult and the opportunity to see Ms. Lauralyn Primes, MD [facility] [phone] 3:06 PM

## 2023-01-16 NOTE — Progress Notes (Signed)
Transition of Care [facility]) - Inpatient Brief Assessment   Patient Details  Name: Connie Vaughn MRN: [id number] Date of Birth: 1976/04/01  Transition of [facility]) CM/SW Contact:    Janae Bridgeman, RN Phone Number: 01/16/2023, 4:20 PM   Clinical Narrative: No TOC needs.  Patient is being discharged home with family.  Brief screening and note completed.   Transition of Care Asessment: Insurance and Status: (P) Insurance coverage has been reviewed Patient has primary care physician: (P) Yes Home environment has been reviewed: (P) Yes - home with spouse Prior level of function:: (P) Independent Prior/Current Home Services: (P) No current home services Social Determinants of Health Reivew: (P) SDOH reviewed no interventions necessary Readmission risk has been reviewed: (P) Yes Transition of care needs: (P) no transition of care needs at this time

## 2023-03-06 ENCOUNTER — Encounter (HOSPITAL_BASED_OUTPATIENT_CLINIC_OR_DEPARTMENT_OTHER): Payer: Self-pay | Admitting: Emergency Medicine

## 2023-03-06 ENCOUNTER — Emergency Department (HOSPITAL_BASED_OUTPATIENT_CLINIC_OR_DEPARTMENT_OTHER): Payer: Managed Care, Other (non HMO)

## 2023-03-06 ENCOUNTER — Inpatient Hospital Stay (HOSPITAL_BASED_OUTPATIENT_CLINIC_OR_DEPARTMENT_OTHER)
Admission: EM | Admit: 2023-03-06 | Discharge: 2023-03-08 | DRG: 682 | Disposition: A | Discharge status: Home / Self Care | Payer: Managed Care, Other (non HMO) | Attending: Internal Medicine | Admitting: Internal Medicine

## 2023-03-06 ENCOUNTER — Other Ambulatory Visit: Payer: Self-pay

## 2023-03-06 DIAGNOSIS — Z7985 Long-term (current) use of injectable non-insulin antidiabetic drugs: Secondary | ICD-10-CM

## 2023-03-06 DIAGNOSIS — Z882 Allergy status to sulfonamides status: Secondary | ICD-10-CM

## 2023-03-06 DIAGNOSIS — N179 Acute kidney failure, unspecified: Secondary | ICD-10-CM | POA: Diagnosis not present

## 2023-03-06 DIAGNOSIS — E1010 Type 1 diabetes mellitus with ketoacidosis without coma: Secondary | ICD-10-CM | POA: Diagnosis present

## 2023-03-06 DIAGNOSIS — E1065 Type 1 diabetes mellitus with hyperglycemia: Secondary | ICD-10-CM

## 2023-03-06 DIAGNOSIS — Z79899 Other long term (current) drug therapy: Secondary | ICD-10-CM

## 2023-03-06 DIAGNOSIS — R197 Diarrhea, unspecified: Secondary | ICD-10-CM | POA: Diagnosis present

## 2023-03-06 DIAGNOSIS — K219 Gastro-esophageal reflux disease without esophagitis: Secondary | ICD-10-CM | POA: Diagnosis present

## 2023-03-06 DIAGNOSIS — R1115 Cyclical vomiting syndrome unrelated to migraine: Secondary | ICD-10-CM | POA: Diagnosis present

## 2023-03-06 DIAGNOSIS — Z1152 Encounter for screening for COVID-19: Secondary | ICD-10-CM

## 2023-03-06 DIAGNOSIS — F909 Attention-deficit hyperactivity disorder, unspecified type: Secondary | ICD-10-CM | POA: Diagnosis present

## 2023-03-06 DIAGNOSIS — I959 Hypotension, unspecified: Secondary | ICD-10-CM | POA: Diagnosis present

## 2023-03-06 DIAGNOSIS — E861 Hypovolemia: Secondary | ICD-10-CM | POA: Diagnosis present

## 2023-03-06 DIAGNOSIS — R159 Full incontinence of feces: Secondary | ICD-10-CM | POA: Diagnosis present

## 2023-03-06 DIAGNOSIS — Z833 Family history of diabetes mellitus: Secondary | ICD-10-CM

## 2023-03-06 DIAGNOSIS — R1110 Vomiting, unspecified: Secondary | ICD-10-CM

## 2023-03-06 DIAGNOSIS — Z794 Long term (current) use of insulin: Secondary | ICD-10-CM

## 2023-03-06 DIAGNOSIS — A419 Sepsis, unspecified organism: Principal | ICD-10-CM

## 2023-03-06 DIAGNOSIS — Z8249 Family history of ischemic heart disease and other diseases of the circulatory system: Secondary | ICD-10-CM

## 2023-03-06 DIAGNOSIS — E860 Dehydration: Secondary | ICD-10-CM | POA: Diagnosis present

## 2023-03-06 DIAGNOSIS — F17210 Nicotine dependence, cigarettes, uncomplicated: Secondary | ICD-10-CM | POA: Diagnosis present

## 2023-03-06 DIAGNOSIS — Z9851 Tubal ligation status: Secondary | ICD-10-CM

## 2023-03-06 DIAGNOSIS — L03116 Cellulitis of left lower limb: Secondary | ICD-10-CM | POA: Diagnosis present

## 2023-03-06 LAB — GLUCOSE, CAPILLARY
Glucose-Capillary: 276 mg/dL — ABNORMAL HIGH (ref 70–99)
Glucose-Capillary: 262 mg/dL — ABNORMAL HIGH (ref 70–99)

## 2023-03-06 LAB — BASIC METABOLIC PANEL
Anion gap: 26 — ABNORMAL HIGH (ref 5–15)
Anion gap: 21 — ABNORMAL HIGH (ref 5–15)
BUN: 22 mg/dL — ABNORMAL HIGH (ref 6–20)
BUN: 22 mg/dL — ABNORMAL HIGH (ref 6–20)
CO2: 12 mmol/L — ABNORMAL LOW (ref 22–32)
CO2: 14 mmol/L — ABNORMAL LOW (ref 22–32)
Calcium: 9.3 mg/dL (ref 8.9–10.3)
Calcium: 9.7 mg/dL (ref 8.9–10.3)
Chloride: 94 mmol/L — ABNORMAL LOW (ref 98–111)
Chloride: 98 mmol/L (ref 98–111)
Creatinine, Ser: 0.99 mg/dL (ref 0.44–1.00)
Creatinine, Ser: 1.25 mg/dL — ABNORMAL HIGH (ref 0.44–1.00)
GFR, Estimated: >60 mL/min (ref >60)
GFR, Estimated: 54 mL/min — ABNORMAL LOW (ref >60)
Glucose, Bld: 490 mg/dL — ABNORMAL HIGH (ref 70–99)
Glucose, Bld: 216 mg/dL — ABNORMAL HIGH (ref 70–99)
Potassium: 3.9 mmol/L (ref 3.5–5.1)
Potassium: 3.5 mmol/L (ref 3.5–5.1)
Sodium: 132 mmol/L — ABNORMAL LOW (ref 135–145)
Sodium: 133 mmol/L — ABNORMAL LOW (ref 135–145)

## 2023-03-06 LAB — LACTIC ACID, PLASMA
Lactic Acid, Venous: 4.4 mmol/L (ref 0.5–1.9)
Lactic Acid, Venous: 0.9 mmol/L (ref 0.5–1.9)

## 2023-03-06 LAB — CBC WITH DIFFERENTIAL/PLATELET
Abs Immature Granulocytes: 0.04 10*3/uL (ref 0.00–0.07)
Basophils Absolute: 0.1 10*3/uL (ref 0.0–0.1)
Basophils Relative: 1 %
Eosinophils Absolute: 0 10*3/uL (ref 0.0–0.5)
Eosinophils Relative: 0 %
HCT: 38.9 % (ref 36.0–46.0)
Hemoglobin: 13.1 g/dL (ref 12.0–15.0)
Immature Granulocytes: 0 %
Lymphocytes Relative: 25 %
Lymphs Abs: 2.3 10*3/uL (ref 0.7–4.0)
MCH: 29.2 pg (ref 26.0–34.0)
MCHC: 33.7 g/dL (ref 30.0–36.0)
MCV: 86.6 fL (ref 80.0–100.0)
Monocytes Absolute: 0.5 10*3/uL (ref 0.1–1.0)
Monocytes Relative: 6 %
Neutro Abs: 6.3 10*3/uL (ref 1.7–7.7)
Neutrophils Relative %: 68 %
Platelets: 325 10*3/uL (ref 150–400)
RBC: 4.49 MIL/uL (ref 3.87–5.11)
RDW: 13.7 % (ref 11.5–15.5)
WBC: 9.2 10*3/uL (ref 4.0–10.5)
nRBC: 0 % (ref 0.0–0.2)

## 2023-03-06 LAB — URINALYSIS, W/ REFLEX TO CULTURE (INFECTION SUSPECTED)
Bacteria, UA: NONE SEEN
Bilirubin Urine: NEGATIVE
Glucose, UA: >1000 mg/dL — AB
Ketones, ur: >80 mg/dL — AB
Leukocytes,Ua: NEGATIVE
Nitrite: NEGATIVE
Protein, ur: 30 mg/dL — AB
Specific Gravity, Urine: 1.039 — ABNORMAL HIGH (ref 1.005–1.030)
pH: 5.5 (ref 5.0–8.0)

## 2023-03-06 LAB — I-STAT VENOUS BLOOD GAS, ED
Acid-Base Excess: 1 mmol/L (ref 0.0–2.0)
Bicarbonate: 26.1 mmol/L (ref 20.0–28.0)
Calcium, Ion: 1.35 mmol/L (ref 1.15–1.40)
HCT: 35 % — ABNORMAL LOW (ref 36.0–46.0)
Hemoglobin: 11.9 g/dL — ABNORMAL LOW (ref 12.0–15.0)
O2 Saturation: 76 %
Patient temperature: 97.6
Potassium: 4 mmol/L (ref 3.5–5.1)
Sodium: 133 mmol/L — ABNORMAL LOW (ref 135–145)
TCO2: 27 mmol/L (ref 22–32)
pCO2, Ven: 41.4 mmHg — ABNORMAL LOW (ref 44–60)
pH, Ven: 7.405 (ref 7.25–7.43)
pO2, Ven: 40 mmHg (ref 32–45)

## 2023-03-06 LAB — RESP PANEL BY RT-PCR (RSV, FLU A&B, COVID)  RVPGX2
Influenza A by PCR: NEGATIVE
Influenza B by PCR: NEGATIVE
Resp Syncytial Virus by PCR: NEGATIVE
SARS Coronavirus 2 by RT PCR: NEGATIVE

## 2023-03-06 LAB — BETA-HYDROXYBUTYRIC ACID
Beta-Hydroxybutyric Acid: 4.43 mmol/L — ABNORMAL HIGH (ref 0.05–0.27)
Beta-Hydroxybutyric Acid: 6.15 mmol/L — ABNORMAL HIGH (ref 0.05–0.27)

## 2023-03-06 LAB — COMPREHENSIVE METABOLIC PANEL
ALT: 11 U/L (ref 0–44)
AST: 14 U/L — ABNORMAL LOW (ref 15–41)
Albumin: 4.1 g/dL (ref 3.5–5.0)
Alkaline Phosphatase: 101 U/L (ref 38–126)
Anion gap: 19 — ABNORMAL HIGH (ref 5–15)
BUN: 27 mg/dL — ABNORMAL HIGH (ref 6–20)
CO2: 25 mmol/L (ref 22–32)
Calcium: 11.4 mg/dL — ABNORMAL HIGH (ref 8.9–10.3)
Chloride: 92 mmol/L — ABNORMAL LOW (ref 98–111)
Creatinine, Ser: 1.34 mg/dL — ABNORMAL HIGH (ref 0.44–1.00)
GFR, Estimated: 50 mL/min — ABNORMAL LOW (ref >60)
Glucose, Bld: 120 mg/dL — ABNORMAL HIGH (ref 70–99)
Potassium: 3.8 mmol/L (ref 3.5–5.1)
Sodium: 136 mmol/L (ref 135–145)
Total Bilirubin: 0.4 mg/dL (ref 0.3–1.2)
Total Protein: 7.4 g/dL (ref 6.5–8.1)

## 2023-03-06 LAB — C-REACTIVE PROTEIN: CRP: 0.7 mg/dL (ref <1.0)

## 2023-03-06 LAB — SEDIMENTATION RATE: Sed Rate: 44 mm/h — ABNORMAL HIGH (ref 0–22)

## 2023-03-06 LAB — CBG MONITORING, ED: Glucose-Capillary: 299 mg/dL — ABNORMAL HIGH (ref 70–99)

## 2023-03-06 LAB — PREGNANCY, URINE: Preg Test, Ur: NEGATIVE

## 2023-03-06 MED ORDER — VANCOMYCIN HCL IN DEXTROSE 1-5 GM/200ML-% IV SOLN
1000.0000 mg | Freq: Once | INTRAVENOUS | Status: AC
  Administered 2023-03-06: 1000 mg via INTRAVENOUS
  Filled 2023-03-06: qty 200

## 2023-03-06 MED ORDER — ALUM & MAG HYDROXIDE-SIMETH 200-200-20 MG/5ML PO SUSP
30.0000 mL | Freq: Once | ORAL | Status: AC
  Administered 2023-03-06: 30 mL via ORAL
  Filled 2023-03-06: qty 30

## 2023-03-06 MED ORDER — SODIUM CHLORIDE 0.9 % IV SOLN
2.0000 g | Freq: Two times a day (BID) | INTRAVENOUS | Status: AC
  Administered 2023-03-07 (×2): 2 g via INTRAVENOUS
  Filled 2023-03-06 (×2): qty 12.5

## 2023-03-06 MED ORDER — PROCHLORPERAZINE EDISYLATE 10 MG/2ML IJ SOLN: 5.0000 mg | Freq: Four times a day (QID) | INTRAMUSCULAR | Status: DC | PRN

## 2023-03-06 MED ORDER — DEXTROSE IN LACTATED RINGERS 5 % IV SOLN: INTRAVENOUS | Status: DC

## 2023-03-06 MED ORDER — LACTATED RINGERS IV SOLN: INTRAVENOUS | Status: DC

## 2023-03-06 MED ORDER — POTASSIUM CHLORIDE 10 MEQ/100ML IV SOLN
10.0000 meq | INTRAVENOUS | Status: AC
  Administered 2023-03-06 (×2): 10 meq via INTRAVENOUS
  Filled 2023-03-06 (×2): qty 100

## 2023-03-06 MED ORDER — LACTATED RINGERS IV BOLUS
20.0000 mL/kg | Freq: Once | INTRAVENOUS | Status: AC
  Administered 2023-03-06: 442 mL via INTRAVENOUS

## 2023-03-06 MED ORDER — OXYCODONE HCL 5 MG PO TABS: 5.0000 mg | ORAL_TABLET | Freq: Four times a day (QID) | ORAL | Status: DC | PRN

## 2023-03-06 MED ORDER — SACCHAROMYCES BOULARDII 250 MG PO CAPS
250.0000 mg | ORAL_CAPSULE | Freq: Two times a day (BID) | ORAL | Status: DC
  Administered 2023-03-07 – 2023-03-08 (×3): 250 mg via ORAL
  Filled 2023-03-06 (×3): qty 1

## 2023-03-06 MED ORDER — ACETAMINOPHEN 325 MG PO TABS: 650.0000 mg | ORAL_TABLET | Freq: Four times a day (QID) | ORAL | Status: DC | PRN

## 2023-03-06 MED ORDER — DEXTROSE 50 % IV SOLN: 0.0000 mL | INTRAVENOUS | Status: DC | PRN

## 2023-03-06 MED ORDER — SODIUM CHLORIDE 0.9 % IV SOLN
2.0000 g | Freq: Once | INTRAVENOUS | Status: AC
  Administered 2023-03-06: 2 g via INTRAVENOUS
  Filled 2023-03-06: qty 12.5

## 2023-03-06 MED ORDER — INSULIN ASPART 100 UNIT/ML IJ SOLN
5.0000 [IU] | Freq: Once | INTRAMUSCULAR | Status: AC
  Administered 2023-03-06: 5 [IU] via SUBCUTANEOUS

## 2023-03-06 MED ORDER — MELATONIN 5 MG PO TABS: 5.0000 mg | ORAL_TABLET | Freq: Every evening | ORAL | Status: DC | PRN

## 2023-03-06 MED ORDER — LACTATED RINGERS IV BOLUS (SEPSIS)
1000.0000 mL | Freq: Once | INTRAVENOUS | Status: AC
  Administered 2023-03-06: 1000 mL via INTRAVENOUS

## 2023-03-06 MED ORDER — VANCOMYCIN HCL IN DEXTROSE 1-5 GM/200ML-% IV SOLN
1000.0000 mg | INTRAVENOUS | Status: AC
  Administered 2023-03-07: 1000 mg via INTRAVENOUS
  Filled 2023-03-06: qty 200

## 2023-03-06 MED ORDER — METRONIDAZOLE 500 MG/100ML IV SOLN
500.0000 mg | Freq: Once | INTRAVENOUS | Status: AC
  Administered 2023-03-06: 500 mg via INTRAVENOUS
  Filled 2023-03-06: qty 100

## 2023-03-06 MED ORDER — INSULIN REGULAR(HUMAN) IN NACL 100-0.9 UT/100ML-% IV SOLN
INTRAVENOUS | Status: DC
  Administered 2023-03-06: 5 [IU]/h via INTRAVENOUS
  Filled 2023-03-06 (×2): qty 100

## 2023-03-06 MED ORDER — ENOXAPARIN SODIUM 40 MG/0.4ML IJ SOSY
40.0000 mg | PREFILLED_SYRINGE | INTRAMUSCULAR | Status: DC
  Administered 2023-03-06 – 2023-03-07 (×2): 40 mg via SUBCUTANEOUS
  Filled 2023-03-06 (×2): qty 0.4

## 2023-03-06 MED ORDER — IOHEXOL 300 MG/ML  SOLN
80.0000 mL | Freq: Once | INTRAMUSCULAR | Status: AC | PRN
  Administered 2023-03-06: 80 mL via INTRAVENOUS

## 2023-03-06 NOTE — ED Notes (Signed)
Patient transported to CT 

## 2023-03-06 NOTE — ED Notes (Signed)
Connie Vaughn with cl called for transport 

## 2023-03-06 NOTE — Progress Notes (Signed)
Plan of Care Note for accepted transfer  Patient: Connie Vaughn    MWU:[id number]  DOA: 03/06/2023     Nursing staff, Please call [facility] Admits & Consults System-Wide number on Amion as soon as patient's arrival to the unit (not the listed attending) so that the appropriate admitting provider can evaluate the pt. ASAP to avoid any delay in care. Facility requesting transfer: [facility] Requesting Provider: ED provider Dr Carmie End Reason for transfer: Admission Facility course: Patient presented to ED with complaints of nausea vomiting and diarrhea.  PMH of type 1 diabetes mellitus as well as left foot great toe infection. Seen by PCP on 8/29 for left great toe wound.  Based on the documentation the wound appears to be superficial in nature. Original plan was to start on doxycycline but was prescribed clindamycin 300 mg 3 times daily for 7 days on 8/29. Patient was also referred to podiatry with an appointment scheduled on 9/9 with Dr. Loreta Ave. Presents to the [facility] ER with 8-9 episodes of diarrhea, abdominal crampy pain as well as nausea and vomiting. Found to have AKI with serum creatinine of 1.3, anion gap of 19, hypotension with blood pressure in 80s and tachycardia. EKG showed sinus tachycardia. Per ED provider the foot appears to be healing well. X-ray foot negative for any acute osteo as well. CT abdomen in the ED does not show any evidence of colitis or any other acute abnormality. Chest extremity comment about pneumonia but CT abdomen showed clear lower lungs. Patient continues to have nausea and vomiting with abdominal pain.  And was referred for admission for symptom control.  Plan of care: The patient is accepted for admission to Telemetry unit, at [facility]. Recommended ED provider to check BMP as if she still has anion gap would recommend to initiate on a DKA protocol with IV insulin and at which time patient will require admission to stepdown unit. Also  recommended ED provider to discontinue antibiotic given concern for C. difficile as well as improvement in appearance of the left foot as well as completion of the antibiotic course already. Continue with IV hydration.  Author: Lynden Oxford, MD  03/06/2023  Check [email or web address] for on-call coverage.

## 2023-03-06 NOTE — ED Triage Notes (Signed)
treated for foot wound. On clinda  Reports n/v and frequent diarrhea. X weeks  High blood sugars in AM.  Unable to eat, no appetite

## 2023-03-06 NOTE — ED Provider Notes (Signed)
Per my review and interpretation of labs does not appear that she is in DKA.  Lactic acid has actually cleared from 4.4-0.9.  Hemodynamics are improving.  Her blood pressure has been stable since IV fluids.  CT scan of the abdomen and pelvis with no acute findings per radiology report.  X-ray of the left foot with no signs of osteomyelitis.  Clinically the left foot appears to be doing pretty well from an infection standpoint.  Overall we will admit for dehydration.  Will repeat check a BMP.  Will give her some NovoLog now.  I will think she is in DKA but may need to start insulin infusion if BMP suggests that we need to.  Patient admitted to hospital service for further care.  This chart was dictated using voice recognition software.  Despite best efforts to proofread,  errors can occur which can change the documentation meaning.    Virgina Norfolk, DO 03/06/23 1704

## 2023-03-06 NOTE — Progress Notes (Signed)
Elink following for sepsis protocol. 

## 2023-03-06 NOTE — H&P (Addendum)
History and Physical  BILAN RUFFCORN QMV:[id number] DOB: 1975/07/08 DOA: 03/06/2023  Referring physician: Accepted by Dr. Allena Katz as a direct admission from [facility] ED.  PCP: Jackelyn Poling, DO  Outpatient Specialists: None Patient coming from: Home through [facility] ED.  Chief Complaint: Nausea vomiting abdominal cramping and diarrhea  HPI: Connie Vaughn is a 47 y.o. female with medical history significant for type 1 diabetes, left great toe wound infection followed by podiatry with ongoing oral antibiotic therapy clindamycin started on 02/26/23 x 7 days, who initially presented to [facility] ED with complaints of nausea vomiting abdominal cramping and frequent loose stools for the past few days.  Symptoms worsened today.  Associated with multiple episodes of fecal incontinence.  Denies any subjective fevers or chills.  Endorses she has not been able to keep anything down for the past 2 days due to nausea and vomiting.  She feels weak.  In the ED, the patient is afebrile, hypotensive and tachycardic.  Lab studies notable for low serum bicarb, high anion gap, ketonuria and elevated beta hydroxybutyrate acid.    CT abdomen and pelvis with contrast was nonacute.  TRH, hospitalist service, was asked to admit.  The patient was transferred to [facility] for further management.  Insulin drip and IV fluid initiated.  Started on DKA type I protocol.  ED Course: Tmax 98.  BP 133/88, pulse 119, respiratory rate 18, saturation 100% on room air.  Lab studies notable for serum sodium 132, serum glucose 150/90, serum bicarb 12, anion gap 26, GFR greater than 60.  Lactic acid 4.4, repeat 0.9.  Sed rate 44.  WBC 9.2.  Review of Systems: Review of systems as noted in the HPI. All other systems reviewed and are negative.   Past Medical History:  Diagnosis Date   ADHD (attention deficit hyperactivity disorder)    AKI (acute kidney injury) (HCC) 10/06/2020   Anemia    Diabetes mellitus type 1,  uncontrolled    DKA (diabetic ketoacidosis) (HCC) 10/06/2020   GERD (gastroesophageal reflux disease)    Incontinence, feces    Past Surgical History:  Procedure Laterality Date   I & D EXTREMITY Right 03/09/2021   Procedure: IRRIGATION AND DEBRIDEMENT RIGHT RING FINGER;  Surgeon: Betha Loa, MD;  Location: [facility] OR;  Service: Orthopedics;  Laterality: Right;   TUBAL LIGATION      Social History:  reports that she has been smoking cigarettes. She has a 0.5 pack-year smoking history. She has never used smokeless tobacco. She reports current drug use. Drug: Marijuana. She reports that she does not drink alcohol.   Allergies  Allergen Reactions   Sulfa Antibiotics Hives    Family History  Problem Relation Age of Onset   Heart disease Mother    Congestive Heart Failure Mother    Congestive Heart Failure Sister    Diabetes Maternal Grandmother       Prior to Admission medications   Medication Sig Start Date End Date Taking? Authorizing Provider  Continuous Glucose Sensor (FREESTYLE LIBRE 3 SENSOR) MISC 1 each every 14 (fourteen) days.    [provider]  D3-50 1.25 MG (50000 UT) capsule Take 50,000 Units by mouth 3 (three) times a week. 03/06/21   [provider]  glucose blood test strip Use as instructed 11/18/17   [name], NP  insulin degludec (TRESIBA FLEXTOUCH) 100 UNIT/ML FlexTouch Pen Inject 18 Units into the skin at bedtime. 08/22/19   [provider]  Insulin Pen Needle (B-D UF III  MINI PEN NEEDLES) 31G X 5 MM MISC Use as instructed 11/18/17   [name], NP  Lancets MISC by Does not apply route. 07/09/13   [provider]  LYUMJEV KWIKPEN 100 UNIT/ML KwikPen Inject 10-12 Units into the skin 2 (two) times daily. 04/03/21   [provider]  ondansetron (ZOFRAN) 4 MG tablet Take 1 tablet (4 mg total) by mouth every 8 (eight) hours as needed for nausea or vomiting. 01/16/23 01/16/24  Cathren Harsh, MD    Physical Exam: BP  (!) 150/84 (BP Location: Left Arm)   Pulse (!) 112   Temp 97.8 F (36.6 C)   Resp 18   Ht 5\' 4"  (1.626 m)   Wt 22.1 kg   SpO2 100%   BMI 8.36 kg/m   General: 47 y.o. year-old female well developed well nourished in no acute distress.  Alert and oriented x3. Cardiovascular: Tachycardic with no rubs or gallops.  No thyromegaly or JVD noted.  No lower extremity edema. 2/4 pulses in all 4 extremities. Respiratory: Clear to auscultation with no wheezes or rales. Good inspiratory effort. Abdomen: Soft nontender nondistended with normal bowel sounds x4 quadrants. Muskuloskeletal: No cyanosis, clubbing or edema noted bilaterally Neuro: CN II-XII intact, strength, sensation, reflexes Skin: No ulcerative lesions noted or rashes Psychiatry: Judgement and insight appear normal. Mood is appropriate for condition and setting          Labs on Admission:  Basic Metabolic Panel: Recent Labs  Lab 03/06/23 1349 03/06/23 1429 03/06/23 1817  NA 136 133* 132*  K 3.8 4.0 3.9  CL 92*  --  94*  CO2 25  --  12*  GLUCOSE 120*  --  490*  BUN 27*  --  22*  CREATININE 1.34*  --  0.99  CALCIUM 11.4*  --  9.3   Liver Function Tests: Recent Labs  Lab 03/06/23 1349  AST 14*  ALT 11  ALKPHOS 101  BILITOT 0.4  PROT 7.4  ALBUMIN 4.1   No results for input(s): "LIPASE", "AMYLASE" in the last 168 hours. No results for input(s): "AMMONIA" in the last 168 hours. CBC: Recent Labs  Lab 03/06/23 1349 03/06/23 1429  WBC 9.2  --   NEUTROABS 6.3  --   HGB 13.1 11.9*  HCT 38.9 35.0*  MCV 86.6  --   PLT 325  --    Cardiac Enzymes: No results for input(s): "CKTOTAL", "CKMB", "CKMBINDEX", "TROPONINI" in the last 168 hours.  BNP (last 3 results) No results for input(s): "BNP" in the last 8760 hours.  ProBNP (last 3 results) No results for input(s): "PROBNP" in the last 8760 hours.  CBG: Recent Labs  Lab 03/06/23 1534 03/06/23 1921  GLUCAP 299* 276*    Radiological Exams on Admission: CT  ABDOMEN PELVIS W CONTRAST  Result Date: 03/06/2023 CLINICAL DATA:  Intra-abdominal infection/peritonitis (Ped 0-17y) EXAM: CT ABDOMEN AND PELVIS WITH CONTRAST TECHNIQUE: Multidetector CT imaging of the abdomen and pelvis was performed using the standard protocol following bolus administration of intravenous contrast. RADIATION DOSE REDUCTION: This exam was performed according to the departmental dose-optimization program which includes automated exposure control, adjustment of the mA and/or kV according to patient size and/or use of iterative reconstruction technique. CONTRAST:  80mL OMNIPAQUE IOHEXOL 300 MG/ML  SOLN COMPARISON:  CT scan urogram report from 05/16/2011. Images are not available for review. FINDINGS: Lower chest: The lung bases are clear. No pleural effusion. The heart is normal in size. No pericardial effusion. Hepatobiliary: The liver  is enlarged in size (length of 20 cm). Non-cirrhotic configuration. There is a geographic, hypoattenuating area along the falciform ligament attachment site (marked with electronic arrow sign on series 2 and 5), which even though incompletely characterized on the current examination, favored to represent focal fatty infiltration. Attention on follow-up examination is recommended. No intrahepatic or extrahepatic bile duct dilation. Small/contracted gallbladder. No calcified gallstones. Normal gallbladder wall thickness. No pericholecystic inflammatory changes. Pancreas: Unremarkable. No pancreatic ductal dilatation or surrounding inflammatory changes. Spleen: Within normal limits. No focal lesion. Adrenals/Urinary Tract: Adrenal glands are unremarkable. No suspicious renal mass. No hydronephrosis. There are 1 each, punctate nonobstructing calculi in bilateral kidneys. No other nephroureterolithiasis on either side. Unremarkable urinary bladder. Stomach/Bowel: No disproportionate dilation of the small or large bowel loops. No evidence of abnormal bowel wall thickening  or inflammatory changes. The appendix is unremarkable. Vascular/Lymphatic: No ascites or pneumoperitoneum. No abdominal or pelvic lymphadenopathy, by size criteria. No aneurysmal dilation of the major abdominal arteries. Reproductive: The uterus is unremarkable. No large adnexal mass. Corpus luteal cyst noted in the left ovary. Other: The visualized soft tissues and abdominal wall are unremarkable. Musculoskeletal: No suspicious osseous lesions. IMPRESSION: 1. No acute inflammatory process identified within the abdomen or pelvis. 2. Hepatomegaly. Geographic, hypoattenuating area along the falciform ligament attachment site, which even though incompletely characterized on the current examination, favored to represent focal fatty infiltration. Attention on follow-up examination is recommended. 3. Multiple other nonacute observations, as described above. Electronically Signed   By: Jules Schick M.D.   On: 03/06/2023 16:36   DG Foot Complete Left  Result Date: 03/06/2023 CLINICAL DATA:  Left foot pain. Infection along the dorsal aspect of the great toe. EXAM: LEFT FOOT - COMPLETE 3 VIEW COMPARISON:  X-ray 01/14/2023 FINDINGS: Mild hypertrophic degenerative changes along the first metatarsophalangeal joint. Bipartite medial sesamoid bone of the first ray. No underlying fracture or dislocation. Preserved joint spaces and bone mineralization. No areas of bony erosive changes. Please correlate for location of ulcer. If there is further concern of bone infection, follow up study with the MRI or bone scan could be performed for further sensitivity. IMPRESSION: No acute osseous abnormality. Stable hypertrophic changes involving the first metatarsophalangeal joint. Electronically Signed   By: Karen Kays M.D.   On: 03/06/2023 16:26   DG Chest Port 1 View  Result Date: 03/06/2023 CLINICAL DATA:  Sepsis EXAM: PORTABLE CHEST 1 VIEW COMPARISON:  X-ray 03/09/2021 FINDINGS: The heart size and mediastinal contours are within  normal limits. No pneumothorax or effusion. No edema. Subtle opacity at the left lung base. An infiltrate is possible. Curvature of the spine. IMPRESSION: Subtle opacity at the left lung base, new from previous. Acute infiltrate is possible. Recommend follow-up Electronically Signed   By: Karen Kays M.D.   On: 03/06/2023 15:54    EKG: I independently viewed the EKG done and my findings are as followed: Sinus tachycardia rate of 113.  Nonspecific ST-T changes.  QTc 486.  Assessment/Plan Present on Admission:  AKI (acute kidney injury) (HCC)  Active Problems:   AKI (acute kidney injury) (HCC)  AKI, likely prerenal in the setting of dehydration from poor oral intake, vomiting and diarrhea Baseline creatinine 0.9 with GFR greater than 60 Presented with creatinine of 1.34 with GFR 50 Continue IV fluid hydration Avoid nephrotoxic agents, dehydration and hypotension Monitor urine output Repeat renal panel in the morning  DKA type I likely triggered by dehydration from nausea vomiting and diarrhea Last hemoglobin A1c 11.4 on 01/15/2023.  DKA protocol in place IV insulin IV hydration Replete electrolytes BMP every 4 hours Beta-hydroxybutyrate acid every 8 hours Diabetes coordinator  High anion gap metabolic acidosis in the setting of DKA and lactic acidosis Continue to treat underlying conditions Repeat BMP as stated above  Lactic acidosis, resolved Continue IV fluid hydration  Left great toe infection, POA Treated with clindamycin outpatient, from 02/26/2023 x 7 days No acute osseous abnormality seen on x-ray. Left foot MRI done on 01/15/2023 showed no evidence of acute osteomyelitis. As needed analgesics Hold home p.o. clindamycin Add Florastor 250 mg twice daily IV vancomycin and cefepime initiated in the ED, continue  Nausea, vomiting, diarrhea Follow stool study, C. difficile PCR and GI panel IV antiemetics as needed IV fluid to avoid dehydration   Time: 75  minutes.   DVT prophylaxis: Subcu Lovenox daily  Code Status: Full code  Family Communication: Updated her husband at bedside  Disposition Plan: Admitted to stepdown unit  Consults called: Diabetes coordinator  Admission status: Observation status.   Status is: Observation    [name] MD [facility] Pager [phone]  If 7PM-7AM, please contact night-coverage [email or web address] Password [facility]  03/06/2023, 8:29 PM

## 2023-03-06 NOTE — ED Provider Notes (Signed)
[place] EMERGENCY DEPARTMENT AT [facility] Provider Note   CSN: [id number] Arrival date & time: 03/06/23  1302     History  Chief Complaint  Patient presents with   Abdominal Pain    Connie Vaughn is a 47 y.o. female.  With a past medical history of insulin-dependent type 1 diabetes and recurrent soft tissue infection who presents to ED for abdominal pain.  She is currently being treated with oral clindamycin for the treatment of left foot cellulitis.  She has been taking this medication for the last week or so.  She presents today with abdominal pain, nausea vomiting and diarrhea.  8-10 episodes of diarrhea in the last 24 hours.  Multiple episodes of fecal incontinence in the last week as well.  Denies fevers chills chest pain shortness of breath.   Abdominal Pain Associated symptoms: diarrhea, nausea and vomiting        Home Medications Prior to Admission medications   Medication Sig Start Date End Date Taking? Authorizing Provider  Continuous Glucose Sensor (FREESTYLE LIBRE 3 SENSOR) MISC 1 each every 14 (fourteen) days.    [provider]  D3-50 1.25 MG (50000 UT) capsule Take 50,000 Units by mouth 3 (three) times a week. 03/06/21   [provider]  glucose blood test strip Use as instructed 11/18/17   [name], NP  insulin degludec (TRESIBA FLEXTOUCH) 100 UNIT/ML FlexTouch Pen Inject 18 Units into the skin at bedtime. 08/22/19   [provider]  Insulin Pen Needle (B-D UF III MINI PEN NEEDLES) 31G X 5 MM MISC Use as instructed 11/18/17   [name], NP  Lancets MISC by Does not apply route. 07/09/13   [provider]  LYUMJEV KWIKPEN 100 UNIT/ML KwikPen Inject 10-12 Units into the skin 2 (two) times daily. 04/03/21   [provider]  ondansetron (ZOFRAN) 4 MG tablet Take 1 tablet (4 mg total) by mouth every 8 (eight) hours as needed for nausea or vomiting. 01/16/23 01/16/24  Rai, Delene Ruffini, MD      Allergies     Sulfa antibiotics    Review of Systems   Review of Systems  Gastrointestinal:  Positive for abdominal pain, diarrhea, nausea and vomiting.  All other systems reviewed and are negative.   Physical Exam Updated Vital Signs BP 131/80   Pulse (!) 118   Temp 97.6 F (36.4 C) (Oral)   Resp 19   Ht 5\' 4"  (1.626 m)   Wt 62.7 kg   SpO2 99%   BMI 23.72 kg/m  Physical Exam Vitals and nursing note reviewed.  HENT:     Head: Normocephalic and atraumatic.  Eyes:     Pupils: Pupils are equal, round, and reactive to light.  Cardiovascular:     Rate and Rhythm: Normal rate and regular rhythm.  Pulmonary:     Effort: Pulmonary effort is normal.     Breath sounds: Normal breath sounds.  Abdominal:     Palpations: Abdomen is soft.     Tenderness: There is abdominal tenderness in the suprapubic area and left lower quadrant. There is no guarding or rebound.  Skin:    General: Skin is warm and dry.     Comments: Healing ulcer over plantar surface of left great toe without erythema or drainage  Neurological:     Mental Status: She is alert.  Psychiatric:        Mood and Affect: Mood normal.     ED Results / Procedures /  Treatments   Labs (all labs ordered are listed, but only abnormal results are displayed) Labs Reviewed  LACTIC ACID, PLASMA - Abnormal; Notable for the following components:      Result Value   Lactic Acid, Venous 4.4 (*)    All other components within normal limits  COMPREHENSIVE METABOLIC PANEL - Abnormal; Notable for the following components:   Chloride 92 (*)    Glucose, Bld 120 (*)    BUN 27 (*)    Creatinine, Ser 1.34 (*)    Calcium 11.4 (*)    AST 14 (*)    GFR, Estimated 50 (*)    Anion gap 19 (*)    All other components within normal limits  I-STAT VENOUS BLOOD GAS, ED - Abnormal; Notable for the following components:   pCO2, Ven 41.4 (*)    Sodium 133 (*)    HCT 35.0 (*)    Hemoglobin 11.9 (*)    All other components within normal limits  RESP  PANEL BY RT-PCR (RSV, FLU A&B, COVID)  RVPGX2  CULTURE, BLOOD (ROUTINE X 2)  CULTURE, BLOOD (ROUTINE X 2)  C DIFFICILE QUICK SCREEN W PCR REFLEX    CBC WITH DIFFERENTIAL/PLATELET  LACTIC ACID, PLASMA  PREGNANCY, URINE  URINALYSIS, W/ REFLEX TO CULTURE (INFECTION SUSPECTED)  BETA-HYDROXYBUTYRIC ACID  SEDIMENTATION RATE  C-REACTIVE PROTEIN    EKG None  Radiology No results found.  Procedures Procedures    Medications Ordered in ED Medications  lactated ringers infusion (has no administration in time range)  metroNIDAZOLE (FLAGYL) IVPB 500 mg (500 mg Intravenous New Bag/Given 03/06/23 1508)  vancomycin (VANCOCIN) IVPB 1000 mg/200 mL premix (has no administration in time range)  lactated ringers bolus 1,000 mL (has no administration in time range)  ceFEPIme (MAXIPIME) 2 g in sodium chloride 0.9 % 100 mL IVPB (has no administration in time range)  vancomycin (VANCOCIN) IVPB 1000 mg/200 mL premix (has no administration in time range)  lactated ringers bolus 1,000 mL (1,000 mLs Intravenous New Bag/Given 03/06/23 1359)  ceFEPIme (MAXIPIME) 2 g in sodium chloride 0.9 % 100 mL IVPB (2 g Intravenous New Bag/Given 03/06/23 1403)  iohexol (OMNIPAQUE) 300 MG/ML solution 80 mL (80 mLs Intravenous Contrast Given 03/06/23 1500)    ED Course/ Medical Decision Making/ A&P Clinical Course as of 03/06/23 1516  Fri Mar 06, 2023  1514 Laboratory workup notable for lactate of 4.4.  No leukocytosis or evidence of DKA.  CMP notable for AKI likely in the setting of hypovolemia.  Awaiting CT abdomen pelvis.  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  She has been taking an  outpatient course of clindamycin for the treatment of left foot wound over the last week.  Copious diarrhea with abdominal pain.  Based on initial vital signs meeting SIRS criteria.  Will provide 2 L IV fluid bolus (just over 30 cc/kg) and cover with broad-spectrum antibiotics.  Will cover for suspected intra-abdominal source as well as soft tissue infection..  Patient reports her blood sugars have been persistently high at home and  I suspect this is likely in the setting of acute infection.  We will need to evaluate for DKA as well.  Will also obtain ESR CRP given concern for potential osteomyelitis and will obtain an x-ray of the left foot.  I am worried about potential C. difficile as well given that she is on clinda and is having copious watery diarrhea.  Will send C. difficile assay.  Plan for admission  Amount and/or Complexity of Data Reviewed Labs: ordered. Radiology: ordered. ECG/medicine tests: ordered.  Risk Prescription drug management.           Final Clinical Impression(s) / ED Diagnoses Final diagnoses:  Sepsis, due to unspecified organism, unspecified whether acute organ dysfunction present (HCC)  Vomiting and diarrhea  Poorly controlled type 1 diabetes mellitus [facility])    Rx / DC Orders ED Discharge Orders     None         Royanne Foots, DO 03/06/23 1516

## 2023-03-06 NOTE — Progress Notes (Signed)
Pharmacy Antibiotic Note  Connie Vaughn is a 47 y.o. female admitted on 03/06/2023 with sepsis. Pharmacy has been consulted for vancomycin and cefepime dosing. Pt is afebrile and WBC is WNL. Scr is above baseline and lactic acid is elevated >4. Pt was on clindamycin PTA.  Plan: Vancomycin 1g IV Q24H  Cefepime 2g IV Q12H F/u renal fxn, C&S, clinical status and peak/trough at SS  Height: 5\' 4"  (162.6 cm) Weight: 62.7 kg (138 lb 3.2 oz) IBW/kg (Calculated) : 54.7  Temp (24hrs), Avg:97.6 F (36.4 C), Min:97.6 F (36.4 C), Max:97.6 F (36.4 C)  Recent Labs  Lab 03/06/23 1345 03/06/23 1349  WBC  --  9.2  CREATININE  --  1.34*  LATICACIDVEN 4.4*  --     Estimated Creatinine Clearance: 45.3 mL/min (A) (by C-G formula based on SCr of 1.34 mg/dL (H)).    Allergies  Allergen Reactions   Sulfa Antibiotics Hives    Antimicrobials this admission: Vanc 9/6>> Cefepime 9/6>> Flagyl x 1 9/6  Dose adjustments this admission: N/A  Microbiology results: Pending  Thank you for allowing pharmacy to be a part of this patient's care.  Connie Vaughn, Drake Leach 03/06/2023 1:56 PM

## 2023-03-06 NOTE — Progress Notes (Signed)
Pt transferred to floor from [facility] ED via [facility]. Pt settled into bed and vitals obtained. Upon arrival, accepting provider Dr. Margo Aye up to floor and placed transfer orders for pt to SDU as pt meeting criteria for DKA. DKA protocol and IV insulin orders placed. Bed accepted to 1225 in SDU. Report called to accepting RN. Pt gathered all belongings and transported to new room in 1225 via wheelchair and accompanied by spouse. Chart tubed to 2nd floor.

## 2023-03-07 DIAGNOSIS — Z882 Allergy status to sulfonamides status: Secondary | ICD-10-CM | POA: Diagnosis not present

## 2023-03-07 DIAGNOSIS — K219 Gastro-esophageal reflux disease without esophagitis: Secondary | ICD-10-CM | POA: Diagnosis present

## 2023-03-07 DIAGNOSIS — Z8249 Family history of ischemic heart disease and other diseases of the circulatory system: Secondary | ICD-10-CM | POA: Diagnosis not present

## 2023-03-07 DIAGNOSIS — E860 Dehydration: Secondary | ICD-10-CM | POA: Diagnosis present

## 2023-03-07 DIAGNOSIS — R1115 Cyclical vomiting syndrome unrelated to migraine: Secondary | ICD-10-CM | POA: Diagnosis present

## 2023-03-07 DIAGNOSIS — E861 Hypovolemia: Secondary | ICD-10-CM | POA: Diagnosis present

## 2023-03-07 DIAGNOSIS — E1010 Type 1 diabetes mellitus with ketoacidosis without coma: Secondary | ICD-10-CM | POA: Diagnosis present

## 2023-03-07 DIAGNOSIS — Z833 Family history of diabetes mellitus: Secondary | ICD-10-CM | POA: Diagnosis not present

## 2023-03-07 DIAGNOSIS — F17210 Nicotine dependence, cigarettes, uncomplicated: Secondary | ICD-10-CM | POA: Diagnosis present

## 2023-03-07 DIAGNOSIS — I959 Hypotension, unspecified: Secondary | ICD-10-CM | POA: Diagnosis present

## 2023-03-07 DIAGNOSIS — Z9851 Tubal ligation status: Secondary | ICD-10-CM | POA: Diagnosis not present

## 2023-03-07 DIAGNOSIS — Z79899 Other long term (current) drug therapy: Secondary | ICD-10-CM | POA: Diagnosis not present

## 2023-03-07 DIAGNOSIS — Z794 Long term (current) use of insulin: Secondary | ICD-10-CM | POA: Diagnosis not present

## 2023-03-07 DIAGNOSIS — R197 Diarrhea, unspecified: Secondary | ICD-10-CM | POA: Diagnosis present

## 2023-03-07 DIAGNOSIS — N179 Acute kidney failure, unspecified: Secondary | ICD-10-CM | POA: Diagnosis present

## 2023-03-07 DIAGNOSIS — Z1152 Encounter for screening for COVID-19: Secondary | ICD-10-CM | POA: Diagnosis not present

## 2023-03-07 DIAGNOSIS — L03116 Cellulitis of left lower limb: Secondary | ICD-10-CM | POA: Diagnosis present

## 2023-03-07 DIAGNOSIS — R159 Full incontinence of feces: Secondary | ICD-10-CM | POA: Diagnosis present

## 2023-03-07 DIAGNOSIS — F909 Attention-deficit hyperactivity disorder, unspecified type: Secondary | ICD-10-CM | POA: Diagnosis present

## 2023-03-07 DIAGNOSIS — Z7985 Long-term (current) use of injectable non-insulin antidiabetic drugs: Secondary | ICD-10-CM | POA: Diagnosis not present

## 2023-03-07 LAB — COMPREHENSIVE METABOLIC PANEL
ALT: 14 U/L (ref 0–44)
AST: 16 U/L (ref 15–41)
Albumin: 2.9 g/dL — ABNORMAL LOW (ref 3.5–5.0)
Alkaline Phosphatase: 80 U/L (ref 38–126)
Anion gap: 14 (ref 5–15)
BUN: 19 mg/dL (ref 6–20)
CO2: 21 mmol/L — ABNORMAL LOW (ref 22–32)
Calcium: 9.2 mg/dL (ref 8.9–10.3)
Chloride: 99 mmol/L (ref 98–111)
Creatinine, Ser: 0.93 mg/dL (ref 0.44–1.00)
GFR, Estimated: >60 mL/min (ref >60)
Glucose, Bld: 135 mg/dL — ABNORMAL HIGH (ref 70–99)
Potassium: 3.8 mmol/L (ref 3.5–5.1)
Sodium: 134 mmol/L — ABNORMAL LOW (ref 135–145)
Total Bilirubin: 1.1 mg/dL (ref 0.3–1.2)
Total Protein: 5.8 g/dL — ABNORMAL LOW (ref 6.5–8.1)

## 2023-03-07 LAB — GLUCOSE, CAPILLARY
Glucose-Capillary: 115 mg/dL — ABNORMAL HIGH (ref 70–99)
Glucose-Capillary: 120 mg/dL — ABNORMAL HIGH (ref 70–99)
Glucose-Capillary: 128 mg/dL — ABNORMAL HIGH (ref 70–99)
Glucose-Capillary: 132 mg/dL — ABNORMAL HIGH (ref 70–99)
Glucose-Capillary: 135 mg/dL — ABNORMAL HIGH (ref 70–99)
Glucose-Capillary: 143 mg/dL — ABNORMAL HIGH (ref 70–99)
Glucose-Capillary: 150 mg/dL — ABNORMAL HIGH (ref 70–99)
Glucose-Capillary: 162 mg/dL — ABNORMAL HIGH (ref 70–99)
Glucose-Capillary: 174 mg/dL — ABNORMAL HIGH (ref 70–99)
Glucose-Capillary: 178 mg/dL — ABNORMAL HIGH (ref 70–99)
Glucose-Capillary: 196 mg/dL — ABNORMAL HIGH (ref 70–99)
Glucose-Capillary: 228 mg/dL — ABNORMAL HIGH (ref 70–99)
Glucose-Capillary: 238 mg/dL — ABNORMAL HIGH (ref 70–99)
Glucose-Capillary: 244 mg/dL — ABNORMAL HIGH (ref 70–99)
Glucose-Capillary: 277 mg/dL — ABNORMAL HIGH (ref 70–99)
Glucose-Capillary: 304 mg/dL — ABNORMAL HIGH (ref 70–99)
Glucose-Capillary: 321 mg/dL — ABNORMAL HIGH (ref 70–99)

## 2023-03-07 LAB — GASTROINTESTINAL PANEL BY PCR, STOOL (REPLACES STOOL CULTURE)

## 2023-03-07 LAB — LACTIC ACID, PLASMA
Lactic Acid, Venous: 2.6 mmol/L (ref 0.5–1.9)
Lactic Acid, Venous: 4.7 mmol/L (ref 0.5–1.9)
Lactic Acid, Venous: 2.9 mmol/L (ref 0.5–1.9)
Lactic Acid, Venous: 2.4 mmol/L (ref 0.5–1.9)

## 2023-03-07 LAB — CBC
HCT: 33.6 % — ABNORMAL LOW (ref 36.0–46.0)
HCT: 34.7 % — ABNORMAL LOW (ref 36.0–46.0)
Hemoglobin: 11.1 g/dL — ABNORMAL LOW (ref 12.0–15.0)
Hemoglobin: 11.4 g/dL — ABNORMAL LOW (ref 12.0–15.0)
MCH: 29.1 pg (ref 26.0–34.0)
MCH: 29.8 pg (ref 26.0–34.0)
MCHC: 32.9 g/dL (ref 30.0–36.0)
MCHC: 33 g/dL (ref 30.0–36.0)
MCV: 88.2 fL (ref 80.0–100.0)
MCV: 90.6 fL (ref 80.0–100.0)
Platelets: 277 10*3/uL (ref 150–400)
Platelets: 294 10*3/uL (ref 150–400)
RBC: 3.81 MIL/uL — ABNORMAL LOW (ref 3.87–5.11)
RBC: 3.83 MIL/uL — ABNORMAL LOW (ref 3.87–5.11)
RDW: 13.9 % (ref 11.5–15.5)
RDW: 13.9 % (ref 11.5–15.5)
WBC: 8.7 10*3/uL (ref 4.0–10.5)
WBC: 8.8 10*3/uL (ref 4.0–10.5)
nRBC: 0 % (ref 0.0–0.2)
nRBC: 0 % (ref 0.0–0.2)

## 2023-03-07 LAB — C DIFFICILE QUICK SCREEN W PCR REFLEX
C Diff antigen: NEGATIVE
C Diff interpretation: NOT DETECTED
C Diff toxin: NEGATIVE

## 2023-03-07 LAB — PHOSPHORUS: Phosphorus: 2.4 mg/dL — ABNORMAL LOW (ref 2.5–4.6)

## 2023-03-07 LAB — BASIC METABOLIC PANEL
Anion gap: 20 — ABNORMAL HIGH (ref 5–15)
Anion gap: 14 (ref 5–15)
BUN: 19 mg/dL (ref 6–20)
BUN: 18 mg/dL (ref 6–20)
CO2: 15 mmol/L — ABNORMAL LOW (ref 22–32)
CO2: 19 mmol/L — ABNORMAL LOW (ref 22–32)
Calcium: 9.1 mg/dL (ref 8.9–10.3)
Calcium: 8.5 mg/dL — ABNORMAL LOW (ref 8.9–10.3)
Chloride: 96 mmol/L — ABNORMAL LOW (ref 98–111)
Chloride: 102 mmol/L (ref 98–111)
Creatinine, Ser: 1.03 mg/dL — ABNORMAL HIGH (ref 0.44–1.00)
Creatinine, Ser: 1.05 mg/dL — ABNORMAL HIGH (ref 0.44–1.00)
GFR, Estimated: >60 mL/min (ref >60)
GFR, Estimated: >60 mL/min (ref >60)
Glucose, Bld: 337 mg/dL — ABNORMAL HIGH (ref 70–99)
Glucose, Bld: 130 mg/dL — ABNORMAL HIGH (ref 70–99)
Potassium: 4.3 mmol/L (ref 3.5–5.1)
Potassium: 3.4 mmol/L — ABNORMAL LOW (ref 3.5–5.1)
Sodium: 131 mmol/L — ABNORMAL LOW (ref 135–145)
Sodium: 135 mmol/L (ref 135–145)

## 2023-03-07 LAB — MAGNESIUM: Magnesium: 2 mg/dL (ref 1.7–2.4)

## 2023-03-07 LAB — MRSA NEXT GEN BY PCR, NASAL: MRSA by PCR Next Gen: NOT DETECTED

## 2023-03-07 LAB — BETA-HYDROXYBUTYRIC ACID: Beta-Hydroxybutyric Acid: 2.99 mmol/L — ABNORMAL HIGH (ref 0.05–0.27)

## 2023-03-07 LAB — HIV ANTIBODY (ROUTINE TESTING W REFLEX): HIV Screen 4th Generation wRfx: NONREACTIVE

## 2023-03-07 MED ORDER — LACTATED RINGERS IV BOLUS
500.0000 mL | Freq: Once | INTRAVENOUS | Status: AC
  Administered 2023-03-07: 500 mL via INTRAVENOUS

## 2023-03-07 MED ORDER — ENSURE ENLIVE PO LIQD
237.0000 mL | Freq: Two times a day (BID) | ORAL | Status: DC
  Administered 2023-03-07: 237 mL via ORAL

## 2023-03-07 MED ORDER — SODIUM CHLORIDE 0.9 % IV BOLUS
500.0000 mL | Freq: Once | INTRAVENOUS | Status: AC
  Administered 2023-03-07: 500 mL via INTRAVENOUS

## 2023-03-07 MED ORDER — INSULIN GLARGINE-YFGN 100 UNIT/ML ~~LOC~~ SOLN
15.0000 [IU] | Freq: Every day | SUBCUTANEOUS | Status: DC
  Administered 2023-03-07: 15 [IU] via SUBCUTANEOUS
  Filled 2023-03-07 (×2): qty 0.15

## 2023-03-07 MED ORDER — SODIUM CHLORIDE 0.9 % IV SOLN: INTRAVENOUS | Status: DC

## 2023-03-07 MED ORDER — ORAL CARE MOUTH RINSE: 15.0000 mL | OROMUCOSAL | Status: DC | PRN

## 2023-03-07 MED ORDER — SODIUM CHLORIDE 0.9 % IV BOLUS
1000.0000 mL | Freq: Once | INTRAVENOUS | Status: AC
  Administered 2023-03-07: 1000 mL via INTRAVENOUS

## 2023-03-07 MED ORDER — CHLORHEXIDINE GLUCONATE CLOTH 2 % EX PADS
6.0000 | MEDICATED_PAD | Freq: Every day | CUTANEOUS | Status: DC
  Administered 2023-03-07: 6 via TOPICAL

## 2023-03-07 MED ORDER — INSULIN ASPART 100 UNIT/ML IJ SOLN
0.0000 [IU] | INTRAMUSCULAR | Status: DC
  Administered 2023-03-07: 5 [IU] via SUBCUTANEOUS
  Administered 2023-03-07: 3 [IU] via SUBCUTANEOUS
  Administered 2023-03-07 – 2023-03-08 (×2): 2 [IU] via SUBCUTANEOUS

## 2023-03-07 MED ORDER — SODIUM CHLORIDE 0.9 % IV BOLUS
500.0000 mL | Freq: Once | INTRAVENOUS | Status: DC
Start: 2023-03-07 — End: 2023-03-07

## 2023-03-07 MED ORDER — INSULIN ASPART 100 UNIT/ML IJ SOLN
3.0000 [IU] | Freq: Three times a day (TID) | INTRAMUSCULAR | Status: DC
  Administered 2023-03-07: 3 [IU] via SUBCUTANEOUS

## 2023-03-07 NOTE — Inpatient Diabetes Management (Signed)
Inpatient Diabetes Program Recommendations  AACE/ADA: New Consensus Statement on Inpatient Glycemic Control (2015)  Target Ranges:  Prepandial:   less than 140 mg/dL      Peak postprandial:   less than 180 mg/dL (1-2 hours)      Critically ill patients:  140 - 180 mg/dL   Lab Results  Component Value Date   GLUCAP 128 (H) 03/07/2023   HGBA1C 11.4 (H) 01/15/2023   Diabetes history: DM1(does not make insulin.  Needs correction, basal and meal coverage)  Outpatient Diabetes medications:  Tresiba 18 units at bedtime Lymjev 10-12 units TID  Current orders for Inpatient glycemic control:  Transitioning off of IV insulin to Semglee 10 units every day, Novolog 0-15 units Q4H  DKA, foot infection  Inpatient Diabetes Program Recommendations:    Please consider:  Novolog 0-9 units TID and 0-5 units at bedtime as she has a diet order. Novolog 4 units TID with meals if she eats at least 50%.  Will continue to follow while inpatient.  Thank you, Dulce Sellar, MSN, CDCES Diabetes Coordinator Inpatient Diabetes Program [phone] (team pager from 8a-5p)

## 2023-03-07 NOTE — Plan of Care (Signed)
  Problem: Education: Goal: Knowledge of General Education information will improve Description: Including pain rating scale, medication(s)/side effects and non-pharmacologic comfort measures Outcome: Progressing   Problem: Coping: Goal: Level of anxiety will decrease Outcome: Progressing   Problem: Pain Managment: Goal: General experience of comfort will improve Outcome: Progressing   Problem: Metabolic: Goal: Ability to maintain appropriate glucose levels will improve Outcome: Progressing   Problem: Nutrition: Goal: Adequate nutrition will be maintained Outcome: Not Progressing   Problem: Elimination: Goal: Will not experience complications related to urinary retention Outcome: Adequate for Discharge   Problem: Respiratory: Goal: Will regain and/or maintain adequate ventilation Outcome: Adequate for Discharge

## 2023-03-07 NOTE — Progress Notes (Signed)
PROGRESS NOTE Connie Vaughn  HYQ:[id number] DOB: Sep 13, 1975 DOA: 03/06/2023 PCP: Jackelyn Poling, DO  Brief Narrative/Hospital Course: 47 y.o.f w/ T1DM, left great toe wound infection followed by podiatry with ongoing oral antibiotic therapy clindamycin started on 02/26/23 x 7 days, who initially presented to [facility] with complaints of nausea vomiting abdominal cramping and frequent loose stools for the past few days.Symptoms worsened  w/ associated with multiple episodes of fecal incontinence. she has not been able to keep anything down for the past 2 days due to nausea and vomiting. In the XB:[id number], hypotensive and tachycardic.  Lab studies notable for low serum bicarb, high anion gap, ketonuria and elevated beta hydroxybutyrate acid.   CT abdomen and pelvis with contrast was nonacute. Patient was found to be in DKA IV fluid bolus started placed on insulin drip and admitted for further management Patient also with lactic acidosis hypovolemic hypotension    Subjective: Patient seen examined this morning Complains of diarrhea but improving, no nausea vomiting and her abdominal pain is much better today Overnight hypotensive needing IV fluid bolus this morning blood pressure 110 Labs with blood sugar down at 162 anion gap has improved to 14 Repeat lactic acid pending   Assessment and Plan: Active Problems:   AKI (acute kidney injury) (HCC)  Intractable nausea and vomiting and diarrhea Dehydration Hypovolemic hypotension: Continue antiemetics, follow-up GIP, c diff is negative, continue IV fluids diet as tolerated  AKI: Prerenal in the setting of DKA hypovolemia.  AKI has resolved continue IV fluids  DKA Type 1 diabetes mellitus with AaqUltra hyperglycemia: PTA on Tresiba 18 units bedtime and insulin short-acting.with aggressive fluid hydration and insulin drip anion gap has closed, BHOB decreasing.  Will transition to basal bolus insulin regimen and sliding scale insulin and po  diet. Recent Labs  Lab 03/07/23 0547 03/07/23 0646 03/07/23 0754 03/07/23 0906 03/07/23 1010  GLUCAP 238* 162* 135* 143* 132*    Lactic acidosis: In the setting of hypovolemia and hypotension.  Currently vitals with a stable blood pressure, afebrile, abdomen exam is benign and soft.  Repeat lactic acid is trending up ordered 1.5 L bolus and increased ivf > will repeat lactic acid-if remains elevated will consult critical care, however her abdomen exam is benign and BP stable afebrile. Recent Labs  Lab 03/06/23 1621 03/07/23 0350 03/07/23 0727  LATICACIDVEN 0.9 2.6* 4.7*    Left great toe infection Treated with clindamycin outpatient, from 02/26/2023 x 7 days No acute osseous abnormality seen on x-ray. Left foot MRI done on 01/15/2023 showed no evidence of acute osteomyelitis. Cont prn analgesic. Holding po clindamycin given diarrhea continue probiotics, continue IV vancomycin and cefepime  DVT prophylaxis: enoxaparin (LOVENOX) injection 40 mg Start: 03/06/23 2200 Code Status:   Code Status: Full Code Family Communication: plan of care discussed with patient at bedside. Patient status is:  admitted as observation but remains hospitalized for ongoing  because of DKA Level of care: Stepdown   Dispo: The patient is from: home            Anticipated disposition: home TBD Objective: Vitals last 24 hrs: Vitals:   03/07/23 0400 03/07/23 0700 03/07/23 0804 03/07/23 0900  BP: (!) 111/38 127/68 138/71 139/70  Pulse: (!) 107 92 99 97  Resp: 19 13 13 16   Temp:   98.5 F (36.9 C)   TempSrc:   Oral   SpO2: 98% 99% 100% 100%  Weight:      Height:       Weight change:  Physical Examination: General exam: alert awake, appears much older than stated age HEENT:Oral mucosa moist, Ear/Nose WNL grossly Respiratory system: bilaterally clear BS, no use of accessory muscle Cardiovascular system: S1 & S2 +, No JVD. Gastrointestinal system: Abdomen soft,NT,ND, BS+ Nervous System:Alert,  awake, moving extremities. Extremities: LE edema neg,distal peripheral pulses palpable.  Skin: No rashes,no icterus. MSK: Normal muscle bulk,tone, power  Medications reviewed:  Scheduled Meds:  Chlorhexidine Gluconate Cloth  6 each Topical Daily   enoxaparin (LOVENOX) injection  40 mg Subcutaneous Q24H   feeding supplement  237 mL Oral BID BM   insulin aspart  0-15 Units Subcutaneous Q4H   insulin glargine-yfgn  15 Units Subcutaneous Daily   saccharomyces boulardii  250 mg Oral BID   Continuous Infusions:  sodium chloride 150 mL/hr at 03/07/23 0937   ceFEPime (MAXIPIME) IV Stopped (03/07/23 0327)   dextrose 5% lactated ringers 125 mL/hr at 03/07/23 0757   insulin 1.1 Units/hr (03/07/23 0757)   sodium chloride     vancomycin        Diet Order             Diet Carb Modified Fluid consistency: Thin; Room service appropriate? Yes  Diet effective now                  Intake/Output Summary (Last 24 hours) at 03/07/2023 1026 Last data filed at 03/07/2023 0757 Gross per 24 hour  Intake 2914.21 ml  Output 3 ml  Net 2911.21 ml   Net IO Since Admission: 2,911.21 mL [03/07/23 1026]  Wt Readings from Last 3 Encounters:  03/06/23 58.5 kg  01/15/23 63 kg  01/14/23 61.2 kg     Unresulted Labs (From admission, onward)     Start     Ordered   03/13/23 0500  Creatinine, serum  (enoxaparin (LOVENOX)    CrCl >/= 30 ml/min)  Weekly,   R     Comments: while on enoxaparin therapy    03/06/23 2029   03/08/23 0500  Basic metabolic panel  Daily,   R     Question:  Specimen collection method  Answer:  Lab=Lab collect   03/07/23 0800   03/07/23 1100  Lactic acid, plasma  (Lactic Acid)  STAT Now then every 3 hours,   R (with STAT occurrences)     Question:  Specimen collection method  Answer:  Lab=Lab collect   03/07/23 0903   03/06/23 2333  HIV Antibody (routine testing w rflx)  Once,   R        03/06/23 2333   03/06/23 2107  Gastrointestinal Panel by PCR , Stool  (Gastrointestinal  Panel by PCR, Stool                                                                                                                                                     **  Does Not include CLOSTRIDIUM DIFFICILE testing. **If CDIFF testing is needed, place order from the "C Difficile Testing" order set.**)  ONCE - URGENT,   URGENT        03/06/23 2106   03/06/23 1345  Blood Culture (routine x 2)  (Septic presentation on arrival (screening labs, nursing and treatment orders for obvious sepsis))  BLOOD CULTURE X 2,   STAT      03/06/23 1349          Data Reviewed: I have personally reviewed following labs and imaging studies CBC: Recent Labs  Lab 03/06/23 1349 03/06/23 1429 03/06/23 2348 03/07/23 0350  WBC 9.2  --  8.8 8.7  NEUTROABS 6.3  --   --   --   HGB 13.1 11.9* 11.4* 11.1*  HCT 38.9 35.0* 34.7* 33.6*  MCV 86.6  --  90.6 88.2  PLT 325  --  277 294   Basic Metabolic Panel: Recent Labs  Lab 03/06/23 1817 03/06/23 1958 03/06/23 2348 03/07/23 0350 03/07/23 0727  NA 132* 133* 131* 134* 135  K 3.9 3.5 4.3 3.8 3.4*  CL 94* 98 96* 99 102  CO2 12* 14* 15* 21* 19*  GLUCOSE 490* 216* 337* 135* 130*  BUN 22* 22* 19 19 18   CREATININE 0.99 1.25* 1.03* 0.93 1.05*  CALCIUM 9.3 9.7 9.1 9.2 8.5*  MG  --   --   --  2.0  --   PHOS  --   --   --  2.4*  --    GFR: Estimated Creatinine Clearance: 57.8 mL/min (A) (by C-G formula based on SCr of 1.05 mg/dL (H)). Liver Function Tests: Recent Labs  Lab 03/06/23 1349 03/07/23 0350  AST 14* 16  ALT 11 14  ALKPHOS 101 80  BILITOT 0.4 1.1  PROT 7.4 5.8*  ALBUMIN 4.1 2.9*   Recent Labs  Lab 03/06/23 1345 03/06/23 1621 03/07/23 0350 03/07/23 0727  LATICACIDVEN 4.4* 0.9 2.6* 4.7*   Recent Results (from the past 240 hour(s))  Resp panel by RT-PCR (RSV, Flu A&B, Covid) Anterior Nasal Swab     Status: None   Collection Time: 03/06/23  1:45 PM   Specimen: Anterior Nasal Swab  Result Value Ref Range Status   SARS Coronavirus 2 by RT  PCR NEGATIVE NEGATIVE Final    Comment: (NOTE) SARS-CoV-2 target nucleic acids are NOT DETECTED.  The SARS-CoV-2 RNA is generally detectable in upper respiratory specimens during the acute phase of infection. The lowest concentration of SARS-CoV-2 viral copies this assay can detect is 138 copies/mL. A negative result does not preclude SARS-Cov-2 infection and should not be used as the sole basis for treatment or other patient management decisions. A negative result may occur with  improper specimen collection/handling, submission of specimen other than nasopharyngeal swab, presence of viral mutation(s) within the areas targeted by this assay, and inadequate number of viral copies(<138 copies/mL). A negative result must be combined with clinical observations, patient history, and epidemiological information. The expected result is Negative.  Fact Sheet for Patients:  BloggerCourse.com  Fact Sheet for Healthcare Providers:  SeriousBroker.it  This test is no t yet approved or cleared by the [place] FDA and  has been authorized for detection and/or diagnosis of SARS-CoV-2 by FDA under an Emergency Use Authorization (EUA). This EUA will remain  in effect (meaning this test can be used) for the duration of the COVID-19 declaration under Section 564(b)(1) of the Act, 21 U.S.C.section 360bbb-3(b)(1), unless the authorization is terminated  or  revoked sooner.       Influenza A by PCR NEGATIVE NEGATIVE Final   Influenza B by PCR NEGATIVE NEGATIVE Final    Comment: (NOTE) The Xpert Xpress SARS-CoV-2/FLU/RSV plus assay is intended as an aid in the diagnosis of influenza from Nasopharyngeal swab specimens and should not be used as a sole basis for treatment. Nasal washings and aspirates are unacceptable for Xpert Xpress SARS-CoV-2/FLU/RSV testing.  Fact Sheet for Patients: BloggerCourse.com  Fact Sheet for  Healthcare Providers: SeriousBroker.it  This test is not yet approved or cleared by the [place] FDA and has been authorized for detection and/or diagnosis of SARS-CoV-2 by FDA under an Emergency Use Authorization (EUA). This EUA will remain in effect (meaning this test can be used) for the duration of the COVID-19 declaration under Section 564(b)(1) of the Act, 21 U.S.C. section 360bbb-3(b)(1), unless the authorization is terminated or revoked.     Resp Syncytial Virus by PCR NEGATIVE NEGATIVE Final    Comment: (NOTE) Fact Sheet for Patients: BloggerCourse.com  Fact Sheet for Healthcare Providers: SeriousBroker.it  This test is not yet approved or cleared by the [place] FDA and has been authorized for detection and/or diagnosis of SARS-CoV-2 by FDA under an Emergency Use Authorization (EUA). This EUA will remain in effect (meaning this test can be used) for the duration of the COVID-19 declaration under Section 564(b)(1) of the Act, 21 U.S.C. section 360bbb-3(b)(1), unless the authorization is terminated or revoked.  Performed at [facility], [address], [place], [place] [address]   Blood Culture (routine x 2)     Status: None (Preliminary result)   Collection Time: 03/06/23  1:49 PM   Specimen: BLOOD  Result Value Ref Range Status   Specimen Description   Final    BLOOD RIGHT ANTECUBITAL Performed at [facility], [address] [place], [place] [address]    Special Requests   Final    BOTTLES DRAWN AEROBIC AND ANAEROBIC Blood Culture adequate volume Performed at [facility], [address] [place], [place] [address]    Culture   Final    NO GROWTH < 24 HOURS Performed at [facility], [address], [place], [place] [address]    Report Status PENDING  Incomplete  C Difficile Quick Screen w PCR reflex      Status: None   Collection Time: 03/07/23 12:19 AM   Specimen: Stool  Result Value Ref Range Status   C Diff antigen NEGATIVE NEGATIVE Final   C Diff toxin NEGATIVE NEGATIVE Final   C Diff interpretation No C. difficile detected.  Final    Comment: Performed at [facility], [address] [place], [place] [address]  MRSA Next Gen by PCR, Nasal     Status: None   Collection Time: 03/07/23 12:19 AM   Specimen: Nasal Swab  Result Value Ref Range Status   MRSA by PCR Next Gen NOT DETECTED NOT DETECTED Final    Comment: (NOTE) The GeneXpert MRSA Assay (FDA approved for NASAL specimens only), is one component of a comprehensive MRSA colonization surveillance program. It is not intended to diagnose MRSA infection nor to guide or monitor treatment for MRSA infections. Test performance is not FDA approved in patients less than 11 years old. Performed at [facility], [address] [place], [place] [address]     Antimicrobials: Anti-infectives (From admission, onward)    Start     Dose/Rate Route Frequency Ordered Stop  03/07/23 1600  vancomycin (VANCOCIN) IVPB 1000 mg/200 mL premix        1,000 mg 200 mL/hr over 60 Minutes Intravenous Every 24 hours 03/06/23 1437     03/07/23 0200  ceFEPIme (MAXIPIME) 2 g in sodium chloride 0.9 % 100 mL IVPB        2 g 200 mL/hr over 30 Minutes Intravenous Every 12 hours 03/06/23 1435     03/06/23 1400  ceFEPIme (MAXIPIME) 2 g in sodium chloride 0.9 % 100 mL IVPB        2 g 200 mL/hr over 30 Minutes Intravenous  Once 03/06/23 1349 03/06/23 1435   03/06/23 1400  metroNIDAZOLE (FLAGYL) IVPB 500 mg        500 mg 100 mL/hr over 60 Minutes Intravenous  Once 03/06/23 1349 03/06/23 1610   03/06/23 1400  vancomycin (VANCOCIN) IVPB 1000 mg/200 mL premix        1,000 mg 200 mL/hr over 60 Minutes Intravenous  Once 03/06/23 1349 03/06/23 1735      Culture/Microbiology    Component Value Date/Time   SDES  03/06/2023  1349    BLOOD RIGHT ANTECUBITAL Performed at [facility], [address], [place], [place] [address]    [facility]  03/06/2023 1349    BOTTLES DRAWN AEROBIC AND ANAEROBIC Blood Culture adequate volume Performed at [facility], [address] [place], [place] [address]    CULT  03/06/2023 1349    NO GROWTH < 24 HOURS Performed at [facility], [address] [place], [place] [address]    REPTSTATUS PENDING 03/06/2023 1349  Radiology Studies: CT ABDOMEN PELVIS W CONTRAST  Result Date: 03/06/2023 CLINICAL DATA:  Intra-abdominal infection/peritonitis (Ped 0-17y) EXAM: CT ABDOMEN AND PELVIS WITH CONTRAST TECHNIQUE: Multidetector CT imaging of the abdomen and pelvis was performed using the standard protocol following bolus administration of intravenous contrast. RADIATION DOSE REDUCTION: This exam was performed according to the departmental dose-optimization program which includes automated exposure control, adjustment of the mA and/or kV according to patient size and/or use of iterative reconstruction technique. CONTRAST:  80mL OMNIPAQUE IOHEXOL 300 MG/ML  SOLN COMPARISON:  CT scan urogram report from 05/16/2011. Images are not available for review. FINDINGS: Lower chest: The lung bases are clear. No pleural effusion. The heart is normal in size. No pericardial effusion. Hepatobiliary: The liver is enlarged in size (length of 20 cm). Non-cirrhotic configuration. There is a geographic, hypoattenuating area along the falciform ligament attachment site (marked with electronic arrow sign on series 2 and 5), which even though incompletely characterized on the current examination, favored to represent focal fatty infiltration. Attention on follow-up examination is recommended. No intrahepatic or extrahepatic bile duct dilation. Small/contracted gallbladder. No calcified gallstones. Normal gallbladder wall thickness. No pericholecystic inflammatory changes.  Pancreas: Unremarkable. No pancreatic ductal dilatation or surrounding inflammatory changes. Spleen: Within normal limits. No focal lesion. Adrenals/Urinary Tract: Adrenal glands are unremarkable. No suspicious renal mass. No hydronephrosis. There are 1 each, punctate nonobstructing calculi in bilateral kidneys. No other nephroureterolithiasis on either side. Unremarkable urinary bladder. Stomach/Bowel: No disproportionate dilation of the small or large bowel loops. No evidence of abnormal bowel wall thickening or inflammatory changes. The appendix is unremarkable. Vascular/Lymphatic: No ascites or pneumoperitoneum. No abdominal or pelvic lymphadenopathy, by size criteria. No aneurysmal dilation of the major abdominal arteries. Reproductive: The uterus is unremarkable. No large adnexal mass. Corpus luteal cyst noted in the left ovary. Other: The visualized soft tissues and abdominal wall are unremarkable. Musculoskeletal: No  suspicious osseous lesions. IMPRESSION: 1. No acute inflammatory process identified within the abdomen or pelvis. 2. Hepatomegaly. Geographic, hypoattenuating area along the falciform ligament attachment site, which even though incompletely characterized on the current examination, favored to represent focal fatty infiltration. Attention on follow-up examination is recommended. 3. Multiple other nonacute observations, as described above. Electronically Signed   By: Jules Schick M.D.   On: 03/06/2023 16:36   DG Foot Complete Left  Result Date: 03/06/2023 CLINICAL DATA:  Left foot pain. Infection along the dorsal aspect of the great toe. EXAM: LEFT FOOT - COMPLETE 3 VIEW COMPARISON:  X-ray 01/14/2023 FINDINGS: Mild hypertrophic degenerative changes along the first metatarsophalangeal joint. Bipartite medial sesamoid bone of the first ray. No underlying fracture or dislocation. Preserved joint spaces and bone mineralization. No areas of bony erosive changes. Please correlate for location of  ulcer. If there is further concern of bone infection, follow up study with the MRI or bone scan could be performed for further sensitivity. IMPRESSION: No acute osseous abnormality. Stable hypertrophic changes involving the first metatarsophalangeal joint. Electronically Signed   By: Karen Kays M.D.   On: 03/06/2023 16:26   DG Chest Port 1 View  Result Date: 03/06/2023 CLINICAL DATA:  Sepsis EXAM: PORTABLE CHEST 1 VIEW COMPARISON:  X-ray 03/09/2021 FINDINGS: The heart size and mediastinal contours are within normal limits. No pneumothorax or effusion. No edema. Subtle opacity at the left lung base. An infiltrate is possible. Curvature of the spine. IMPRESSION: Subtle opacity at the left lung base, new from previous. Acute infiltrate is possible. Recommend follow-up Electronically Signed   By: Karen Kays M.D.   On: 03/06/2023 15:54     LOS: 0 days   Lanae Boast, MD [facility]  03/07/2023, 10:26 AM

## 2023-03-07 NOTE — Hospital Course (Signed)
47 y.o.f w/ T1DM, left great toe wound infection followed by podiatry with ongoing oral antibiotic therapy clindamycin started on 02/26/23 x 7 days, who initially presented to [facility] with complaints of nausea vomiting abdominal cramping and frequent loose stools for the past few days.Symptoms worsened  w/ associated with multiple episodes of fecal incontinence. she has not been able to keep anything down for the past 2 days due to nausea and vomiting. In the NW:[id number], hypotensive and tachycardic.  Lab studies notable for low serum bicarb, high anion gap, ketonuria and elevated beta hydroxybutyrate acid.   CT abdomen and pelvis with contrast was nonacute. Patient was found to be in DKA IV fluid bolus started placed on insulin drip and admitted for further management Patient also with lactic acidosis hypovolemic hypotension. Patient was aggressively hydrated with IV fluids, subsequently lactic acid resolved.  Placed on diabetic diet transitioED to subcu insulin blood sugar has stabilized.  Patient is very upset about not getting regular diet, discussed compliance need in detail. At this time she is medically stable for discharge home

## 2023-03-07 NOTE — Plan of Care (Signed)

## 2023-03-07 NOTE — Progress Notes (Signed)
CRITICAL VALUE STICKER  CRITICAL VALUE: Lactic Acid 4.7  RECEIVER (on-site recipient of call): Gertie Exon RN  DATE & TIME NOTIFIED: 03/07/23 @ 0830  MD NOTIFIED: Lanae Boast MD  TIME OF NOTIFICATION: [phone]

## 2023-03-08 DIAGNOSIS — N179 Acute kidney failure, unspecified: Secondary | ICD-10-CM | POA: Diagnosis not present

## 2023-03-08 LAB — CBC
HCT: 33.2 % — ABNORMAL LOW (ref 36.0–46.0)
Hemoglobin: 10.5 g/dL — ABNORMAL LOW (ref 12.0–15.0)
MCH: 29.7 pg (ref 26.0–34.0)
MCHC: 31.6 g/dL (ref 30.0–36.0)
MCV: 93.8 fL (ref 80.0–100.0)
Platelets: 234 10*3/uL (ref 150–400)
RBC: 3.54 MIL/uL — ABNORMAL LOW (ref 3.87–5.11)
RDW: 14.2 % (ref 11.5–15.5)
WBC: 6.1 10*3/uL (ref 4.0–10.5)
nRBC: 0 % (ref 0.0–0.2)

## 2023-03-08 LAB — BASIC METABOLIC PANEL
Anion gap: 10 (ref 5–15)
BUN: 12 mg/dL (ref 6–20)
CO2: 21 mmol/L — ABNORMAL LOW (ref 22–32)
Calcium: 7.8 mg/dL — ABNORMAL LOW (ref 8.9–10.3)
Chloride: 105 mmol/L (ref 98–111)
Creatinine, Ser: 0.56 mg/dL (ref 0.44–1.00)
GFR, Estimated: >60 mL/min (ref >60)
Glucose, Bld: 144 mg/dL — ABNORMAL HIGH (ref 70–99)
Potassium: 3.5 mmol/L (ref 3.5–5.1)
Sodium: 136 mmol/L (ref 135–145)

## 2023-03-08 LAB — LACTIC ACID, PLASMA: Lactic Acid, Venous: 1.5 mmol/L (ref 0.5–1.9)

## 2023-03-08 LAB — GLUCOSE, CAPILLARY
Glucose-Capillary: 105 mg/dL — ABNORMAL HIGH (ref 70–99)
Glucose-Capillary: 131 mg/dL — ABNORMAL HIGH (ref 70–99)

## 2023-03-08 NOTE — Plan of Care (Signed)

## 2023-03-08 NOTE — Progress Notes (Signed)
AVS and discharge instructions reviewed w/ patient. Patient verbalized understanding and discharged to home via Husband.

## 2023-03-08 NOTE — TOC Initial Note (Signed)
Transition of Care [facility]) - Initial/Assessment Note    Patient Details  Name: Connie Vaughn MRN: [id number] Date of Birth: 1975-12-11  Transition of Care [facility]) CM/SW Contact:    Adrian Prows, RN Phone Number: 03/08/2023, 10:42 AM  Clinical Narrative:                 Sherron Monday w/ pt in room; pt says she is from home and plans to return at d/c; she identified POC Darleene Mazanec (spouse) [phone]; pt denies SDOH risks; she verifies PCP and insurance; pt has glasses,  cane, and grab bars in shower; pt says she does not have HH services or home oxygen; no TOC needs.  Expected Discharge Plan: Home/Self Care Barriers to Discharge: No Barriers Identified   Patient Goals and CMS Choice Patient states their goals for this hospitalization and ongoing recovery are:: home          Expected Discharge Plan and Services   Discharge Planning Services: CM Consult   Living arrangements for the past 2 months: Apartment Expected Discharge Date: 03/08/23               DME Arranged: N/A DME Agency: NA       HH Arranged: NA HH Agency: NA        Prior Living Arrangements/Services Living arrangements for the past 2 months: Apartment Lives with:: Spouse Patient language and need for interpreter reviewed:: Yes Do you feel safe going back to the place where you live?: Yes      Need for Family Participation in Patient Care: Yes (Comment) Care giver support system in place?: Yes (comment) Current home services: DME (cane) Criminal Activity/Legal Involvement Pertinent to Current Situation/Hospitalization: No - Comment as needed  Activities of Daily Living Home Assistive Devices/Equipment: None ADL Screening (condition at time of admission) Patient's cognitive ability adequate to safely complete daily activities?: Yes Is the patient deaf or have difficulty hearing?: No Does the patient have difficulty seeing, even when wearing glasses/contacts?: No Does the patient have difficulty  concentrating, remembering, or making decisions?: No Patient able to express need for assistance with ADLs?: Yes Does the patient have difficulty dressing or bathing?: No Independently performs ADLs?: Yes (appropriate for developmental age) Does the patient have difficulty walking or climbing stairs?: No Weakness of Legs: None Weakness of Arms/Hands: None  Permission Sought/Granted Permission sought to share information with : Case Manager Permission granted to share information with : Yes, Verbal Permission Granted  Share Information with NAME: Case Manger     Permission granted to share info w Relationship: Jahira Galer (spouse) [phone]     Emotional Assessment Appearance:: Appears stated age Attitude/Demeanor/Rapport: Gracious Affect (typically observed): Accepting Orientation: : Oriented to Self, Oriented to Place, Oriented to  Time, Oriented to Situation Alcohol / Substance Use: Not Applicable Psych Involvement: No (comment)  Admission diagnosis:  Vomiting and diarrhea [R11.10, R19.7] AKI (acute kidney injury) (HCC) [N17.9] Poorly controlled type 1 diabetes mellitus (HCC) [E10.65] Sepsis, due to unspecified organism, unspecified whether acute organ dysfunction present [facility]) [A41.9] Patient Active Problem List   Diagnosis Date Noted   AKI (acute kidney injury) (HCC) 03/06/2023   Osteomyelitis of great toe of left foot (HCC) 01/15/2023   Cellulitis in diabetic foot (HCC) 01/15/2023   Hyperlipidemia 10/06/2020   Diabetic polyneuropathy associated with type 1 diabetes mellitus (HCC) 08/18/2018   Chronic post-traumatic stress disorder (PTSD) 05/26/2017   Type 1 diabetes mellitus with hyperglycemia (HCC) 07/23/2010   PCP:  Jackelyn Poling, DO  Pharmacy:   [facility] [address] [place] - [address] [place] [address] Phone: [phone] Fax: [phone]  [id number] [place], [place] - [address] [place] [address] Phone: [phone] Fax: [phone]  [id number] [place], [place] - [address] AT [facility] [address] [place] [address] Phone: [phone] Fax: [phone]     Social Determinants of Health (SDOH) Social History: SDOH Screenings   Food Insecurity: No Food Insecurity (03/08/2023)  Housing: Low Risk  (03/08/2023)  Transportation Needs: No Transportation Needs (03/08/2023)  Utilities: Not At Risk (03/08/2023)  Financial Resource Strain: Medium Risk (08/18/2022)   Received from [facility], [facility]  Physical Activity: Unknown (08/18/2022)   Received from [facility]  Recent Concern: Physical Activity - Inactive (08/18/2022)   Received from [facility], [facility]  Social Connections: Somewhat Isolated (08/18/2022)   Received from [facility], [facility]  Stress: Stress Concern Present (08/18/2022)   [facility] from [facility], [facility]  Tobacco Use: High Risk (03/06/2023)   SDOH Interventions: Food Insecurity Interventions: Intervention Not Indicated, Inpatient TOC Housing Interventions: Intervention Not Indicated, Inpatient TOC Transportation Interventions: Intervention Not Indicated, Inpatient TOC Utilities Interventions: Intervention Not Indicated, Inpatient TOC   Readmission Risk Interventions    03/08/2023   10:40 AM  Readmission Risk Prevention Plan  Transportation Screening Complete  PCP or Specialist Appt within 5-7 Days Complete  Home Care Screening Complete  Medication Review (RN CM) Complete

## 2023-03-08 NOTE — Plan of Care (Signed)
  Problem: Education: Goal: Knowledge of General Education information will improve Description: Including pain rating scale, medication(s)/side effects and non-pharmacologic comfort measures Outcome: Progressing   Problem: Health Behavior/Discharge Planning: Goal: Ability to manage health-related needs will improve Outcome: Progressing   Problem: Clinical Measurements: Goal: Ability to maintain clinical measurements within normal limits will improve Outcome: Progressing Goal: Diagnostic test results will improve Outcome: Progressing Goal: Respiratory complications will improve Outcome: Progressing Goal: Cardiovascular complication will be avoided Outcome: Progressing   Problem: Activity: Goal: Risk for activity intolerance will decrease Outcome: Progressing   Problem: Nutrition: Goal: Adequate nutrition will be maintained Outcome: Progressing   Problem: Coping: Goal: Level of anxiety will decrease Outcome: Progressing   Problem: Elimination: Goal: Will not experience complications related to bowel motility Outcome: Progressing Goal: Will not experience complications related to urinary retention Outcome: Progressing   Problem: Pain Managment: Goal: General experience of comfort will improve Outcome: Progressing   Problem: Safety: Goal: Ability to remain free from injury will improve Outcome: Progressing   Problem: Education: Goal: Ability to describe self-care measures that may prevent or decrease complications (Diabetes Survival Skills Education) will improve Outcome: Progressing Goal: Individualized Educational Video(s) Outcome: Progressing   Problem: Coping: Goal: Ability to adjust to condition or change in health will improve Outcome: Progressing   Problem: Fluid Volume: Goal: Ability to maintain a balanced intake and output will improve Outcome: Progressing   Problem: Health Behavior/Discharge Planning: Goal: Ability to identify and utilize available  resources and services will improve Outcome: Progressing Goal: Ability to manage health-related needs will improve Outcome: Progressing   Problem: Metabolic: Goal: Ability to maintain appropriate glucose levels will improve Outcome: Progressing   Problem: Nutritional: Goal: Maintenance of adequate nutrition will improve Outcome: Progressing Goal: Progress toward achieving an optimal weight will improve Outcome: Progressing   Problem: Skin Integrity: Goal: Risk for impaired skin integrity will decrease Outcome: Progressing   Problem: Tissue Perfusion: Goal: Adequacy of tissue perfusion will improve Outcome: Progressing   Problem: Education: Goal: Ability to describe self-care measures that may prevent or decrease complications (Diabetes Survival Skills Education) will improve Outcome: Progressing Goal: Individualized Educational Video(s) Outcome: Progressing   Problem: Cardiac: Goal: Ability to maintain an adequate cardiac output will improve Outcome: Progressing   Problem: Health Behavior/Discharge Planning: Goal: Ability to identify and utilize available resources and services will improve Outcome: Progressing Goal: Ability to manage health-related needs will improve Outcome: Progressing   Problem: Fluid Volume: Goal: Ability to achieve a balanced intake and output will improve Outcome: Progressing   Problem: Metabolic: Goal: Ability to maintain appropriate glucose levels will improve Outcome: Progressing   Problem: Nutritional: Goal: Maintenance of adequate nutrition will improve Outcome: Progressing Goal: Maintenance of adequate weight for body size and type will improve Outcome: Progressing   Problem: Respiratory: Goal: Will regain and/or maintain adequate ventilation Outcome: Progressing   Problem: Urinary Elimination: Goal: Ability to achieve and maintain adequate renal perfusion and functioning will improve Outcome: Progressing   Problem: Clinical  Measurements: Goal: Will remain free from infection Outcome: Not Progressing   Problem: Skin Integrity: Goal: Risk for impaired skin integrity will decrease Outcome: Not Progressing  Pt left big toe is discolored. Pt lactic acid is still elevated

## 2023-03-08 NOTE — Discharge Summary (Signed)
Physician Discharge Summary  Connie Vaughn NWG:[id number] DOB: July 27, 1975 DOA: 03/06/2023  PCP: Jackelyn Poling, DO  Admit date: 03/06/2023 Discharge date: 03/08/2023 Recommendations for Outpatient Follow-up:  Follow up with PCP in 1 weeks-call for appointment Please obtain BMP/CBC in one week  Discharge Dispo: Home Discharge Condition: Stable Code Status:   Code Status: Full Code Diet recommendation:  Diet Order             Diet - low sodium heart healthy           Diet Carb Modified Fluid consistency: Thin; Room service appropriate? Yes  Diet effective now                    Brief/Interim Summary: 47 y.o.f w/ T1DM, left great toe wound infection followed by podiatry with ongoing oral antibiotic therapy clindamycin started on 02/26/23 x 7 days, who initially presented to [facility] with complaints of nausea vomiting abdominal cramping and frequent loose stools for the past few days.Symptoms worsened  w/ associated with multiple episodes of fecal incontinence. she has not been able to keep anything down for the past 2 days due to nausea and vomiting. In the VH:QIONGEXB, hypotensive and tachycardic.  Lab studies notable for low serum bicarb, high anion gap, ketonuria and elevated beta hydroxybutyrate acid.   CT abdomen and pelvis with contrast was nonacute. Patient was found to be in DKA IV fluid bolus started placed on insulin drip and admitted for further management Patient also with lactic acidosis hypovolemic hypotension. Patient was aggressively hydrated with IV fluids, subsequently lactic acid resolved.  Placed on diabetic diet transitioED to subcu insulin blood sugar has stabilized.  Patient is very upset about not getting regular diet, discussed compliance need in detail. At this time she is medically stable for discharge home   Discharge Diagnoses:  Active Problems:   AKI (acute kidney injury) (HCC)  Intractable nausea and vomiting and diarrhea Dehydration Hypovolemic  hypotension: Clinically improved GI panel and CT unremarkable, tolerating diet, vitals stable.    AKI:  prerenal in the setting of DKA hypovolemia.  AKI has resolved   DKA Type 1 diabetes mellitus with AaqUltra hyperglycemia: PTA on Tresiba 18 units bedtime and insulin short-acting.anion gap has closed successfully transitioned to subcu insulin regimen again discussed in detail for need to be compliant with diet need appropriate and close follow-up with her primary care doctor.  Tolerating diet blood sugar stabilized, she will be discharged home  Recent Labs  Lab 03/07/23 1809 03/07/23 2035 03/07/23 2255 03/08/23 0349 03/08/23 0731  GLUCAP 120* 228* 244* 105* 131*    Lactic acidosis: In the setting of hypovolemia and hypotension.  Resolved with aggressive IV fluid hydration. Recent Labs  Lab 03/07/23 1102 03/07/23 1547 03/08/23 0506  LATICACIDVEN 2.9* 2.4* 1.5    Left great toe infection Treated with clindamycin outpatient, from 02/26/2023 x 7 days No acute osseous abnormality seen on x-ray. Left foot MRI done on 01/15/2023 showed no evidence of acute osteomyelitis. Cont prn analgesic.  Briefly received antibiotics, advised to follow-up with her PCP she has a podiatry follow-up coming tomorrow.  Consults: none Subjective: Alert awake oriented resting comfortably she feels ready for discharge home today  Discharge Exam: Vitals:   03/08/23 0615 03/08/23 0639  BP: (!) 169/96 (!) 162/92  Pulse: 87 88  Resp: 16   Temp: 98 F (36.7 C)   SpO2: 100%    General: Pt is alert, awake, not in acute distress Cardiovascular: RRR, S1/S2 +,  no rubs, no gallops Respiratory: CTA bilaterally, no wheezing, no rhonchi Abdominal: Soft, NT, ND, bowel sounds + Extremities: no edema, no cyanosis  Discharge Instructions  Discharge Instructions     Diet - low sodium heart healthy   Complete by: As directed    Discharge instructions   Complete by: As directed    Please call call MD or  return to ER for similar or worsening recurring problem that brought you to hospital or if any fever,nausea/vomiting,abdominal pain, uncontrolled pain, chest pain,  shortness of breath or any other alarming symptoms.  Please follow-up your doctor as instructed in a week time and call the office for appointment.  Please avoid alcohol, smoking, or any other illicit substance and maintain healthy habits including taking your regular medications as prescribed.  You were cared for by a hospitalist during your hospital stay. If you have any questions about your discharge medications or the care you received while you were in the hospital after you are discharged, you can call the unit and ask to speak with the hospitalist on call if the hospitalist that took care of you is not available.  Once you are discharged, your primary care physician will handle any further medical issues. Please note that NO REFILLS for any discharge medications will be authorized once you are discharged, as it is imperative that you return to your primary care physician (or establish a relationship with a primary care physician if you do not have one) for your aftercare needs so that they can reassess your need for medications and monitor your lab values  Check blood sugar 3 times a day and bedtime at home. If blood sugar running above 200 less than 70 please call your MD to adjust insulin. If blood sugars running less 100 do not use insulin and call MD. If you noticed signs and symptoms of hypoglycemia or low blood sugar like jitteriness, confusion, thirst, tremor, sweating- Check blood sugar, drink sugary drink/biscuits/sweets to increase sugar level and call MD or return to ER.   Increase activity slowly   Complete by: As directed    No wound care   Complete by: As directed       Allergies as of 03/08/2023       Reactions   Sulfa Antibiotics Hives        Medication List     TAKE these medications    cefadroxil  500 MG capsule Commonly known as: DURICEF Take 500 mg by mouth 2 (two) times daily.   FreeStyle Libre 3 Sensor Misc 1 each every 14 (fourteen) days.   glucose blood test strip Use as instructed What changed:  how much to take how to take this when to take this   Insulin Pen Needle 31G X 5 MM Misc Commonly known as: B-D UF III MINI PEN NEEDLES Use as instructed What changed:  how much to take how to take this when to take this   Lancets Misc 1 each by Does not apply route in the morning, at noon, and at bedtime.   Lyumjev KwikPen 100 UNIT/ML KwikPen Generic drug: Insulin Lispro-aabc Inject 10-12 Units into the skin 3 (three) times daily with meals.   ondansetron 4 MG tablet Commonly known as: Zofran Take 1 tablet (4 mg total) by mouth every 8 (eight) hours as needed for nausea or vomiting.   [name] FlexTouch 100 UNIT/ML FlexTouch Pen Generic drug: insulin degludec Inject 18 Units into the skin at bedtime.  Follow-up Information     Jackelyn Poling, DO Follow up in 1 week(s).   Specialty: Family Medicine Contact information: [address] [place] [address] [phone]                Allergies  Allergen Reactions   Sulfa Antibiotics Hives    The results of significant diagnostics from this hospitalization (including imaging, microbiology, ancillary and laboratory) are listed below for reference.    Microbiology: Recent Results (from the past 240 hour(s))  Resp panel by RT-PCR (RSV, Flu A&B, Covid) Anterior Nasal Swab     Status: None   Collection Time: 03/06/23  1:45 PM   Specimen: Anterior Nasal Swab  Result Value Ref Range Status   SARS Coronavirus 2 by RT PCR NEGATIVE NEGATIVE Final    Comment: (NOTE) SARS-CoV-2 target nucleic acids are NOT DETECTED.  The SARS-CoV-2 RNA is generally detectable in upper respiratory specimens during the acute phase of infection. The lowest concentration of SARS-CoV-2 viral copies this assay can  detect is 138 copies/mL. A negative result does not preclude SARS-Cov-2 infection and should not be used as the sole basis for treatment or other patient management decisions. A negative result may occur with  improper specimen collection/handling, submission of specimen other than nasopharyngeal swab, presence of viral mutation(s) within the areas targeted by this assay, and inadequate number of viral copies(<138 copies/mL). A negative result must be combined with clinical observations, patient history, and epidemiological information. The expected result is Negative.  Fact Sheet for Patients:  BloggerCourse.com  Fact Sheet for Healthcare Providers:  SeriousBroker.it  This test is no t yet approved or cleared by the [place] FDA and  has been authorized for detection and/or diagnosis of SARS-CoV-2 by FDA under an Emergency Use Authorization (EUA). This EUA will remain  in effect (meaning this test can be used) for the duration of the COVID-19 declaration under Section 564(b)(1) of the Act, 21 U.S.C.section 360bbb-3(b)(1), unless the authorization is terminated  or revoked sooner.       Influenza A by PCR NEGATIVE NEGATIVE Final   Influenza B by PCR NEGATIVE NEGATIVE Final    Comment: (NOTE) The Xpert Xpress SARS-CoV-2/FLU/RSV plus assay is intended as an aid in the diagnosis of influenza from Nasopharyngeal swab specimens and should not be used as a sole basis for treatment. Nasal washings and aspirates are unacceptable for Xpert Xpress SARS-CoV-2/FLU/RSV testing.  Fact Sheet for Patients: BloggerCourse.com  Fact Sheet for Healthcare Providers: SeriousBroker.it  This test is not yet approved or cleared by the [place] FDA and has been authorized for detection and/or diagnosis of SARS-CoV-2 by FDA under an Emergency Use Authorization (EUA). This EUA will remain in  effect (meaning this test can be used) for the duration of the COVID-19 declaration under Section 564(b)(1) of the Act, 21 U.S.C. section 360bbb-3(b)(1), unless the authorization is terminated or revoked.     Resp Syncytial Virus by PCR NEGATIVE NEGATIVE Final    Comment: (NOTE) Fact Sheet for Patients: BloggerCourse.com  Fact Sheet for Healthcare Providers: SeriousBroker.it  This test is not yet approved or cleared by the [place] FDA and has been authorized for detection and/or diagnosis of SARS-CoV-2 by FDA under an Emergency Use Authorization (EUA). This EUA will remain in effect (meaning this test can be used) for the duration of the COVID-19 declaration under Section 564(b)(1) of the Act, 21 U.S.C. section 360bbb-3(b)(1), unless the authorization is terminated or revoked.  Performed at [facility], [phone]  [address], [place], [place] [address]   Blood Culture (routine x 2)     Status: None (Preliminary result)   Collection Time: 03/06/23  1:49 PM   Specimen: BLOOD  Result Value Ref Range Status   Specimen Description   Final    BLOOD RIGHT ANTECUBITAL Performed at [facility], [address], [place], [place] [address]    Special Requests   Final    BOTTLES DRAWN AEROBIC AND ANAEROBIC Blood Culture adequate volume Performed at [facility], [address] [place], [place] [address]    Culture   Final    NO GROWTH 2 DAYS Performed at [facility], [address] [place], [place] [address]    Report Status PENDING  Incomplete  C Difficile Quick Screen w PCR reflex     Status: None   Collection Time: 03/07/23 12:19 AM   Specimen: Stool  Result Value Ref Range Status   C Diff antigen NEGATIVE NEGATIVE Final   C Diff toxin NEGATIVE NEGATIVE Final   C Diff interpretation No C. difficile detected.  Final    Comment: Performed at [facility], [address], [place], [place] [address]  Gastrointestinal Panel by PCR , Stool     Status: None   Collection Time: 03/07/23 12:19 AM   Specimen: Stool  Result Value Ref Range Status   Campylobacter species NOT DETECTED NOT DETECTED Final   Plesimonas shigelloides NOT DETECTED NOT DETECTED Final   Salmonella species NOT DETECTED NOT DETECTED Final   Yersinia enterocolitica NOT DETECTED NOT DETECTED Final   Vibrio species NOT DETECTED NOT DETECTED Final   Vibrio cholerae NOT DETECTED NOT DETECTED Final   Enteroaggregative E coli (EAEC) NOT DETECTED NOT DETECTED Final   Enteropathogenic E coli (EPEC) NOT DETECTED NOT DETECTED Final   Enterotoxigenic E coli (ETEC) NOT DETECTED NOT DETECTED Final   Shiga like toxin producing E coli (STEC) NOT DETECTED NOT DETECTED Final   Shigella/Enteroinvasive E coli (EIEC) NOT DETECTED NOT DETECTED Final   Cryptosporidium NOT DETECTED NOT DETECTED Final   Cyclospora cayetanensis NOT DETECTED NOT DETECTED Final   Entamoeba histolytica NOT DETECTED NOT DETECTED Final   Giardia lamblia NOT DETECTED NOT DETECTED Final   Adenovirus F40/41 NOT DETECTED NOT DETECTED Final   Astrovirus NOT DETECTED NOT DETECTED Final   Norovirus GI/GII NOT DETECTED NOT DETECTED Final   Rotavirus A NOT DETECTED NOT DETECTED Final   Sapovirus (I, II, IV, and V) NOT DETECTED NOT DETECTED Final    Comment: Performed at [facility], [address] [place], [place] [address]  MRSA Next Gen by PCR, Nasal     Status: None   Collection Time: 03/07/23 12:19 AM   Specimen: Nasal Swab  Result Value Ref Range Status   MRSA by PCR Next Gen NOT DETECTED NOT DETECTED Final    Comment: (NOTE) The GeneXpert MRSA Assay (FDA approved for NASAL specimens only), is one component of a comprehensive MRSA colonization surveillance program. It is not intended to diagnose MRSA infection nor to guide or monitor treatment for MRSA infections. Test performance is not FDA  approved in patients less than 77 years old. Performed at [facility], [address], [place], [place] [address]     Procedures/Studies: CT ABDOMEN PELVIS W CONTRAST  Result Date: 03/06/2023 CLINICAL DATA:  Intra-abdominal infection/peritonitis (Ped 0-17y) EXAM: CT ABDOMEN AND PELVIS WITH CONTRAST TECHNIQUE: Multidetector CT imaging of the abdomen and pelvis was performed using the  standard protocol following bolus administration of intravenous contrast. RADIATION DOSE REDUCTION: This exam was performed according to the departmental dose-optimization program which includes automated exposure control, adjustment of the mA and/or kV according to patient size and/or use of iterative reconstruction technique. CONTRAST:  80mL OMNIPAQUE IOHEXOL 300 MG/ML  SOLN COMPARISON:  CT scan urogram report from 05/16/2011. Images are not available for review. FINDINGS: Lower chest: The lung bases are clear. No pleural effusion. The heart is normal in size. No pericardial effusion. Hepatobiliary: The liver is enlarged in size (length of 20 cm). Non-cirrhotic configuration. There is a geographic, hypoattenuating area along the falciform ligament attachment site (marked with electronic arrow sign on series 2 and 5), which even though incompletely characterized on the current examination, favored to represent focal fatty infiltration. Attention on follow-up examination is recommended. No intrahepatic or extrahepatic bile duct dilation. Small/contracted gallbladder. No calcified gallstones. Normal gallbladder wall thickness. No pericholecystic inflammatory changes. Pancreas: Unremarkable. No pancreatic ductal dilatation or surrounding inflammatory changes. Spleen: Within normal limits. No focal lesion. Adrenals/Urinary Tract: Adrenal glands are unremarkable. No suspicious renal mass. No hydronephrosis. There are 1 each, punctate nonobstructing calculi in bilateral kidneys. No other nephroureterolithiasis on  either side. Unremarkable urinary bladder. Stomach/Bowel: No disproportionate dilation of the small or large bowel loops. No evidence of abnormal bowel wall thickening or inflammatory changes. The appendix is unremarkable. Vascular/Lymphatic: No ascites or pneumoperitoneum. No abdominal or pelvic lymphadenopathy, by size criteria. No aneurysmal dilation of the major abdominal arteries. Reproductive: The uterus is unremarkable. No large adnexal mass. Corpus luteal cyst noted in the left ovary. Other: The visualized soft tissues and abdominal wall are unremarkable. Musculoskeletal: No suspicious osseous lesions. IMPRESSION: 1. No acute inflammatory process identified within the abdomen or pelvis. 2. Hepatomegaly. Geographic, hypoattenuating area along the falciform ligament attachment site, which even though incompletely characterized on the current examination, favored to represent focal fatty infiltration. Attention on follow-up examination is recommended. 3. Multiple other nonacute observations, as described above. Electronically Signed   By: Jules Schick M.D.   On: 03/06/2023 16:36   DG Foot Complete Left  Result Date: 03/06/2023 CLINICAL DATA:  Left foot pain. Infection along the dorsal aspect of the great toe. EXAM: LEFT FOOT - COMPLETE 3 VIEW COMPARISON:  X-ray 01/14/2023 FINDINGS: Mild hypertrophic degenerative changes along the first metatarsophalangeal joint. Bipartite medial sesamoid bone of the first ray. No underlying fracture or dislocation. Preserved joint spaces and bone mineralization. No areas of bony erosive changes. Please correlate for location of ulcer. If there is further concern of bone infection, follow up study with the MRI or bone scan could be performed for further sensitivity. IMPRESSION: No acute osseous abnormality. Stable hypertrophic changes involving the first metatarsophalangeal joint. Electronically Signed   By: Karen Kays M.D.   On: 03/06/2023 16:26   DG Chest Port 1  View  Result Date: 03/06/2023 CLINICAL DATA:  Sepsis EXAM: PORTABLE CHEST 1 VIEW COMPARISON:  X-ray 03/09/2021 FINDINGS: The heart size and mediastinal contours are within normal limits. No pneumothorax or effusion. No edema. Subtle opacity at the left lung base. An infiltrate is possible. Curvature of the spine. IMPRESSION: Subtle opacity at the left lung base, new from previous. Acute infiltrate is possible. Recommend follow-up Electronically Signed   By: Karen Kays M.D.   On: 03/06/2023 15:54    Labs: BNP (last 3 results) No results for input(s): "BNP" in the last 8760 hours. Basic Metabolic Panel: Recent Labs  Lab 03/06/23 1958 03/06/23 2348 03/07/23 0350  03/07/23 0727 03/08/23 0506  NA 133* 131* 134* 135 136  K 3.5 4.3 3.8 3.4* 3.5  CL 98 96* 99 102 105  CO2 14* 15* 21* 19* 21*  GLUCOSE 216* 337* 135* 130* 144*  BUN 22* 19 19 18 12   CREATININE 1.25* 1.03* 0.93 1.05* 0.56  CALCIUM 9.7 9.1 9.2 8.5* 7.8*  MG  --   --  2.0  --   --   PHOS  --   --  2.4*  --   --    Liver Function Tests: Recent Labs  Lab 03/06/23 1349 03/07/23 0350  AST 14* 16  ALT 11 14  ALKPHOS 101 80  BILITOT 0.4 1.1  PROT 7.4 5.8*  ALBUMIN 4.1 2.9*   No results for input(s): "LIPASE", "AMYLASE" in the last 168 hours. No results for input(s): "AMMONIA" in the last 168 hours. CBC: Recent Labs  Lab 03/06/23 1349 03/06/23 1429 03/06/23 2348 03/07/23 0350 03/08/23 0506  WBC 9.2  --  8.8 8.7 6.1  NEUTROABS 6.3  --   --   --   --   HGB 13.1 11.9* 11.4* 11.1* 10.5*  HCT 38.9 35.0* 34.7* 33.6* 33.2*  MCV 86.6  --  90.6 88.2 93.8  PLT 325  --  277 294 234   Cardiac Enzymes: No results for input(s): "CKTOTAL", "CKMB", "CKMBINDEX", "TROPONINI" in the last 168 hours. BNP: Invalid input(s): "POCBNP" CBG: Recent Labs  Lab 03/07/23 1809 03/07/23 2035 03/07/23 2255 03/08/23 0349 03/08/23 0731  GLUCAP 120* 228* 244* 105* 131*   D-Dimer No results for input(s): "DDIMER" in the last 72  hours. Hgb A1c No results for input(s): "HGBA1C" in the last 72 hours. Lipid Profile No results for input(s): "CHOL", "HDL", "LDLCALC", "TRIG", "CHOLHDL", "LDLDIRECT" in the last 72 hours. Thyroid function studies No results for input(s): "TSH", "T4TOTAL", "T3FREE", "THYROIDAB" in the last 72 hours.  Invalid input(s): "FREET3" Anemia work up No results for input(s): "VITAMINB12", "FOLATE", "FERRITIN", "TIBC", "IRON", "RETICCTPCT" in the last 72 hours. Urinalysis    Component Value Date/Time   COLORURINE COLORLESS (A) 03/06/2023 1817   APPEARANCEUR CLEAR 03/06/2023 1817   LABSPEC 1.039 (H) 03/06/2023 1817   PHURINE 5.5 03/06/2023 1817   GLUCOSEU >1,000 (A) 03/06/2023 1817   HGBUR TRACE (A) 03/06/2023 1817   BILIRUBINUR NEGATIVE 03/06/2023 1817   KETONESUR >80 (A) 03/06/2023 1817   PROTEINUR 30 (A) 03/06/2023 1817   NITRITE NEGATIVE 03/06/2023 1817   LEUKOCYTESUR NEGATIVE 03/06/2023 1817   Sepsis Labs Recent Labs  Lab 03/06/23 1349 03/06/23 2348 03/07/23 0350 03/08/23 0506  WBC 9.2 8.8 8.7 6.1   Microbiology Recent Results (from the past 240 hour(s))  Resp panel by RT-PCR (RSV, Flu A&B, Covid) Anterior Nasal Swab     Status: None   Collection Time: 03/06/23  1:45 PM   Specimen: Anterior Nasal Swab  Result Value Ref Range Status   SARS Coronavirus 2 by RT PCR NEGATIVE NEGATIVE Final    Comment: (NOTE) SARS-CoV-2 target nucleic acids are NOT DETECTED.  The SARS-CoV-2 RNA is generally detectable in upper respiratory specimens during the acute phase of infection. The lowest concentration of SARS-CoV-2 viral copies this assay can detect is 138 copies/mL. A negative result does not preclude SARS-Cov-2 infection and should not be used as the sole basis for treatment or other patient management decisions. A negative result may occur with  improper specimen collection/handling, submission of specimen other than nasopharyngeal swab, presence of viral mutation(s) within  the areas targeted by this  assay, and inadequate number of viral copies(<138 copies/mL). A negative result must be combined with clinical observations, patient history, and epidemiological information. The expected result is Negative.  Fact Sheet for Patients:  BloggerCourse.com  Fact Sheet for Healthcare Providers:  SeriousBroker.it  This test is no t yet approved or cleared by the [place] FDA and  has been authorized for detection and/or diagnosis of SARS-CoV-2 by FDA under an Emergency Use Authorization (EUA). This EUA will remain  in effect (meaning this test can be used) for the duration of the COVID-19 declaration under Section 564(b)(1) of the Act, 21 U.S.C.section 360bbb-3(b)(1), unless the authorization is terminated  or revoked sooner.       Influenza A by PCR NEGATIVE NEGATIVE Final   Influenza B by PCR NEGATIVE NEGATIVE Final    Comment: (NOTE) The Xpert Xpress SARS-CoV-2/FLU/RSV plus assay is intended as an aid in the diagnosis of influenza from Nasopharyngeal swab specimens and should not be used as a sole basis for treatment. Nasal washings and aspirates are unacceptable for Xpert Xpress SARS-CoV-2/FLU/RSV testing.  Fact Sheet for Patients: BloggerCourse.com  Fact Sheet for Healthcare Providers: SeriousBroker.it  This test is not yet approved or cleared by the [place] FDA and has been authorized for detection and/or diagnosis of SARS-CoV-2 by FDA under an Emergency Use Authorization (EUA). This EUA will remain in effect (meaning this test can be used) for the duration of the COVID-19 declaration under Section 564(b)(1) of the Act, 21 U.S.C. section 360bbb-3(b)(1), unless the authorization is terminated or revoked.     Resp Syncytial Virus by PCR NEGATIVE NEGATIVE Final    Comment: (NOTE) Fact Sheet for  Patients: BloggerCourse.com  Fact Sheet for Healthcare Providers: SeriousBroker.it  This test is not yet approved or cleared by the [place] FDA and has been authorized for detection and/or diagnosis of SARS-CoV-2 by FDA under an Emergency Use Authorization (EUA). This EUA will remain in effect (meaning this test can be used) for the duration of the COVID-19 declaration under Section 564(b)(1) of the Act, 21 U.S.C. section 360bbb-3(b)(1), unless the authorization is terminated or revoked.  Performed at [facility], [address] [place], [place] [address]   Blood Culture (routine x 2)     Status: None (Preliminary result)   Collection Time: 03/06/23  1:49 PM   Specimen: BLOOD  Result Value Ref Range Status   Specimen Description   Final    BLOOD RIGHT ANTECUBITAL Performed at [facility], [address], [place], [place] [address]    Special Requests   Final    BOTTLES DRAWN AEROBIC AND ANAEROBIC Blood Culture adequate volume Performed at [facility], [address], [place], [place] [address]    Culture   Final    NO GROWTH 2 DAYS Performed at [facility], [address] [place], [place] [address]    Report Status PENDING  Incomplete  C Difficile Quick Screen w PCR reflex     Status: None   Collection Time: 03/07/23 12:19 AM   Specimen: Stool  Result Value Ref Range Status   C Diff antigen NEGATIVE NEGATIVE Final   C Diff toxin NEGATIVE NEGATIVE Final   C Diff interpretation No C. difficile detected.  Final    Comment: Performed at [facility], [address], [place], [place] [address]  Gastrointestinal Panel by PCR , Stool     Status: None   Collection Time: 03/07/23 12:19 AM   Specimen: Stool  Result Value Ref Range Status   Campylobacter species NOT DETECTED NOT DETECTED Final   Plesimonas shigelloides NOT DETECTED NOT  DETECTED Final   Salmonella species NOT DETECTED NOT DETECTED Final   Yersinia enterocolitica NOT DETECTED NOT DETECTED Final   Vibrio species NOT DETECTED NOT DETECTED Final   Vibrio cholerae NOT DETECTED NOT DETECTED Final   Enteroaggregative E coli (EAEC) NOT DETECTED NOT DETECTED Final   Enteropathogenic E coli (EPEC) NOT DETECTED NOT DETECTED Final   Enterotoxigenic E coli (ETEC) NOT DETECTED NOT DETECTED Final   Shiga like toxin producing E coli (STEC) NOT DETECTED NOT DETECTED Final   Shigella/Enteroinvasive E coli (EIEC) NOT DETECTED NOT DETECTED Final   Cryptosporidium NOT DETECTED NOT DETECTED Final   Cyclospora cayetanensis NOT DETECTED NOT DETECTED Final   Entamoeba histolytica NOT DETECTED NOT DETECTED Final   Giardia lamblia NOT DETECTED NOT DETECTED Final   Adenovirus F40/41 NOT DETECTED NOT DETECTED Final   Astrovirus NOT DETECTED NOT DETECTED Final   Norovirus GI/GII NOT DETECTED NOT DETECTED Final   Rotavirus A NOT DETECTED NOT DETECTED Final   Sapovirus (I, II, IV, and V) NOT DETECTED NOT DETECTED Final    Comment: Performed at [facility], [address] [place], [place] [address]  MRSA Next Gen by PCR, Nasal     Status: None   Collection Time: 03/07/23 12:19 AM   Specimen: Nasal Swab  Result Value Ref Range Status   MRSA by PCR Next Gen NOT DETECTED NOT DETECTED Final    Comment: (NOTE) The GeneXpert MRSA Assay (FDA approved for NASAL specimens only), is one component of a comprehensive MRSA colonization surveillance program. It is not intended to diagnose MRSA infection nor to guide or monitor treatment for MRSA infections. Test performance is not FDA approved in patients less than 53 years old. Performed at [facility], [address] [place], [place] [address]    Time coordinating discharge: 35 minutes  SIGNED: Lanae Boast, MD  [facility] 03/08/2023, 12:05 PM  If 7PM-7AM, please contact  night-coverage [email or web address]

## 2023-03-09 ENCOUNTER — Ambulatory Visit: Payer: Managed Care, Other (non HMO) | Admitting: Podiatry

## 2023-03-09 DIAGNOSIS — E1042 Type 1 diabetes mellitus with diabetic polyneuropathy: Secondary | ICD-10-CM

## 2023-03-09 DIAGNOSIS — L97521 Non-pressure chronic ulcer of other part of left foot limited to breakdown of skin: Secondary | ICD-10-CM

## 2023-03-09 LAB — GLUCOSE, CAPILLARY: Glucose-Capillary: 117 mg/dL — ABNORMAL HIGH (ref 70–99)

## 2023-03-09 MED ORDER — MUPIROCIN 2 % EX OINT: 1.0000 | TOPICAL_OINTMENT | Freq: Two times a day (BID) | CUTANEOUS | 2 refills | Status: DC

## 2023-03-09 NOTE — Progress Notes (Addendum)
Subjective:   Patient ID: Connie Vaughn, female   DOB: 47 y.o.   MRN: [id number]   HPI Chief Complaint  Patient presents with   Wound Check    Left great toe wound check  Pt stated she had xray's done a few days ago she stated she has had several CT scans on it as well    47 year old female presents the office daily for concerns.  She was just recently discharged in the hospital yesterday.  Prior to that she was on clindamycin for 7 days and due to side effects she was admitted to the hospital.  She has been having this ulceration left foot for the last couple months.  She is also admitted in July for the infection.  She states that only treatment she has had is several antibiotics but is never been "opened up to get the infection out". She states that the color to the foot has been getting better.  She states it was dark in color but this seems to be improving.  Currently denies any fevers or chills.   Review of Systems  All other systems reviewed and are negative.  Past Medical History:  Diagnosis Date   ADHD (attention deficit hyperactivity disorder)    AKI (acute kidney injury) (HCC) 10/06/2020   Anemia    Diabetes mellitus type 1, uncontrolled    DKA (diabetic ketoacidosis) (HCC) 10/06/2020   GERD (gastroesophageal reflux disease)    Incontinence, feces     Past Surgical History:  Procedure Laterality Date   I & D EXTREMITY Right 03/09/2021   Procedure: IRRIGATION AND DEBRIDEMENT RIGHT RING FINGER;  Surgeon: Betha Loa, MD;  Location: [facility] OR;  Service: Orthopedics;  Laterality: Right;   TUBAL LIGATION       Current Outpatient Medications:    mupirocin ointment (BACTROBAN) 2 %, Apply 1 Application topically 2 (two) times daily., Disp: 30 g, Rfl: 2   cefadroxil (DURICEF) 500 MG capsule, Take 500 mg by mouth 2 (two) times daily., Disp: , Rfl:    Continuous Glucose Sensor (FREESTYLE LIBRE 3 SENSOR) MISC, 1 each every 14 (fourteen) days., Disp: , Rfl:    glucose blood test  strip, Use as instructed (Patient taking differently: 1 each by Other route in the morning, at noon, and at bedtime. Use as instructed), Disp: 100 each, Rfl: 12   insulin degludec (TRESIBA FLEXTOUCH) 100 UNIT/ML FlexTouch Pen, Inject 18 Units into the skin at bedtime., Disp: , Rfl:    Insulin Pen Needle (B-D UF III MINI PEN NEEDLES) 31G X 5 MM MISC, Use as instructed (Patient taking differently: 1 each by Other route in the morning, at noon, and at bedtime. Use as instructed), Disp: 100 each, Rfl: 2   Lancets MISC, 1 each by Does not apply route in the morning, at noon, and at bedtime., Disp: , Rfl:    LYUMJEV KWIKPEN 100 UNIT/ML KwikPen, Inject 10-12 Units into the skin 3 (three) times daily with meals., Disp: , Rfl:    ondansetron (ZOFRAN) 4 MG tablet, Take 1 tablet (4 mg total) by mouth every 8 (eight) hours as needed for nausea or vomiting., Disp: 20 tablet, Rfl: 0  Allergies  Allergen Reactions   Sulfa Antibiotics Hives           Objective:  Physical Exam  General: AAO x3, NAD  Dermatological: On the plantar aspect of the left hallux with hyperkeratotic tissue there is 2 superficial areas of skin breakdown with some bloody drainage but there is  no purulence.  There is hyperkeratotic tissue the medial aspect the hallux as well without any underlying ulceration drainage or any signs of infection at this area.  Minimal edema to the toe there is no erythema or warmth.  There is some discoloration present and compared to the pictures that were done previously this seems to be improving.  There is no areas of fluctuation or crepitation.  There is no other lesions identified at this time.  Vascular: Dorsalis Pedis artery and Posterior Tibial artery pedal pulses are palpable bilateral with immedate capillary fill time.  There is no pain with calf compression, swelling, warmth, erythema.   Neruologic: Sensation intact with Phoebe Perch monofilament  Musculoskeletal: No pain to the foot or  leg.     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  No obvious signs of infection so we will hold oral antibiotics today but will have a low threshold for restarting these and she is to let me know immediately should any symptoms worsen or change.  I did debride the hyperkeratotic tissue today and minimal bleeding occurred and hemostasis achieved for manual compression.  Cleaned the area with wound cleanser.  Antibiotic ointment and dressing applied.  Prescribed the Pearson ointment to apply to the wound twice a day.  She can wash with soap and water.  I dispensed a surgical shoe with peg assist for offloading.  Glucose control.  I will have her arterial studies. Monitor for any clinical signs or symptoms of infection and directed to call the office immediately should any occur or go to the ER. Discussed risk of amputation.   Vivi Barrack DPM

## 2023-03-11 LAB — CULTURE, BLOOD (ROUTINE X 2)
Culture: NO GROWTH
Special Requests: ADEQUATE

## 2023-04-10 ENCOUNTER — Encounter: Payer: Self-pay | Admitting: Gastroenterology

## 2023-04-17 ENCOUNTER — Telehealth: Payer: Self-pay | Admitting: Podiatry

## 2023-04-17 ENCOUNTER — Encounter (HOSPITAL_COMMUNITY): Payer: Self-pay

## 2023-04-17 NOTE — Telephone Encounter (Signed)
Received a call from [facility] - heart care, that they need this patient checked for Prior Auth for ABI's by Dr. Ardelle Anton.  CPT [phone] does not need a prior auth. As per automated line, confirmation [id number].  [place] at [facility] was notified and will contact patient to set up.

## 2023-05-19 ENCOUNTER — Encounter: Payer: Self-pay | Admitting: Gastroenterology

## 2023-05-19 ENCOUNTER — Ambulatory Visit (AMBULATORY_SURGERY_CENTER): Payer: Managed Care, Other (non HMO) | Admitting: *Deleted

## 2023-05-19 VITALS — Ht 64.5 in | Wt 130.0 lb

## 2023-05-19 DIAGNOSIS — Z1211 Encounter for screening for malignant neoplasm of colon: Secondary | ICD-10-CM

## 2023-05-19 MED ORDER — NA SULFATE-K SULFATE-MG SULF 17.5-3.13-1.6 GM/177ML PO SOLN
1.0000 | Freq: Once | ORAL | 0 refills | Status: AC
Start: 2023-05-19 — End: 2023-05-19

## 2023-05-19 NOTE — Progress Notes (Signed)
Pt's name and DOB verified at the beginning of the pre-visit wit 2 identifiers  Pt denies any difficulty with ambulating,sitting, laying down or rolling side to side  Pt has issues with ambulation   Pt has no issues moving head neck or swallowing  No egg or soy allergy known to patient   No issues known to pt with past sedation with any surgeries or procedures  Pt denies having issues being intubated  No FH of Malignant Hyperthermia  Pt is not on diet pills or shots  Pt is not on home 02   Pt is not on blood thinners   Pt has frequent issues with constipation RN instructed pt to use Miralax per bottles instructions a week before prep days. Pt states they will  Pt is not on dialysis  Pt denise any abnormal heart rhythms   Pt denies any upcoming cardiac testing  Pt encouraged to use to use Singlecare or Goodrx to reduce cost   Patient's chart reviewed by Cathlyn Parsons CNRA prior to pre-visit and patient appropriate for the [facility].  Pre-visit completed and red dot placed by patient's name on their procedure day (on provider's schedule).  .  Visit by phone  Pt states weight is 130 lb  Instructed pt why it is important to and  to call if they have any changes in health or new medications. Directed them to the # given and on instructions.     Instructions reviewed. Pt given both [facility] main # and MD on call # prior to instructions.  Pt states understanding. Instructed to review again prior to procedure. Pt states they will.   Instructions sent by mail and by My Char  Coupon sent via text to mobile phone and pt verified they received it

## 2023-06-09 ENCOUNTER — Encounter: Payer: Self-pay | Admitting: Gastroenterology

## 2023-06-09 ENCOUNTER — Ambulatory Visit: Payer: Managed Care, Other (non HMO) | Admitting: Gastroenterology

## 2023-06-09 VITALS — BP 168/94 | HR 95 | Temp 97.5°F | Resp 16 | Ht 64.5 in | Wt 130.0 lb

## 2023-06-09 DIAGNOSIS — D122 Benign neoplasm of ascending colon: Secondary | ICD-10-CM | POA: Diagnosis not present

## 2023-06-09 DIAGNOSIS — Z1211 Encounter for screening for malignant neoplasm of colon: Secondary | ICD-10-CM

## 2023-06-09 DIAGNOSIS — K648 Other hemorrhoids: Secondary | ICD-10-CM

## 2023-06-09 DIAGNOSIS — K644 Residual hemorrhoidal skin tags: Secondary | ICD-10-CM | POA: Diagnosis not present

## 2023-06-09 MED ORDER — SODIUM CHLORIDE 0.9 % IV SOLN: 500.0000 mL | Freq: Once | INTRAVENOUS | Status: DC

## 2023-06-09 MED ORDER — FLEET ENEMA RE ENEM
1.0000 | ENEMA | Freq: Once | RECTAL | Status: AC
Start: 2023-06-09 — End: 2023-06-09
  Administered 2023-06-09: 1 via RECTAL

## 2023-06-09 NOTE — Patient Instructions (Addendum)
Await pathology results.  Resume previous diet. Continue present medications, including insulin.  Start with long acting insulin.  Handout on polyps and hemorrhoids provided.  YOU HAD AN ENDOSCOPIC PROCEDURE TODAY AT THE [facility]:   Refer to the procedure report that was given to you for any specific questions about what was found during the examination.  If the procedure report does not answer your questions, please call your gastroenterologist to clarify.  If you requested that your care partner not be given the details of your procedure findings, then the procedure report has been included in a sealed envelope for you to review at your convenience later.  YOU SHOULD EXPECT: Some feelings of bloating in the abdomen. Passage of more gas than usual.  Walking can help get rid of the air that was put into your GI tract during the procedure and reduce the bloating. If you had a lower endoscopy (such as a colonoscopy or flexible sigmoidoscopy) you may notice spotting of blood in your stool or on the toilet paper. If you underwent a bowel prep for your procedure, you may not have a normal bowel movement for a few days.  Please Note:  You might notice some irritation and congestion in your nose or some drainage.  This is from the oxygen used during your procedure.  There is no need for concern and it should clear up in a day or so.  SYMPTOMS TO REPORT IMMEDIATELY:  Following lower endoscopy (colonoscopy or flexible sigmoidoscopy):  Excessive amounts of blood in the stool  Significant tenderness or worsening of abdominal pains  Swelling of the abdomen that is new, acute  Fever of 100F or higher   For urgent or emergent issues, a gastroenterologist can be reached at any hour by calling [phone]. Do not use MyChart messaging for urgent concerns.    DIET:  We do recommend a small meal at first, but then you may proceed to your regular diet.  Drink plenty of fluids but you  should avoid alcoholic beverages for 24 hours.  ACTIVITY:  You should plan to take it easy for the rest of today and you should NOT DRIVE or use heavy machinery until tomorrow (because of the sedation medicines used during the test).    FOLLOW UP: Our staff will call the number listed on your records the next business day following your procedure.  We will call around 7:15- 8:00 am to check on you and address any questions or concerns that you may have regarding the information given to you following your procedure. If we do not reach you, we will leave a message.     If any biopsies were taken you will be contacted by phone or by letter within the next 1-3 weeks.  Please call us at [phone] if you have not heard about the biopsies in 3 weeks.    SIGNATURES/CONFIDENTIALITY: You and/or your care partner have signed paperwork which will be entered into your electronic medical record.  These signatures attest to the fact that that the information above on your After Visit Summary has been reviewed and is understood.  Full responsibility of the confidentiality of this discharge information lies with you and/or your care-partner.

## 2023-06-09 NOTE — Progress Notes (Unsigned)
Pt reports she had fallen, unable to recall exact date, [facility] and hurt  bilateral knees since last previsit phone call on 05/19/2023. Pt reports she had to stop taking antibiotics due to colonoscopy.

## 2023-06-09 NOTE — Progress Notes (Unsigned)
Sedate, gd SR, tolerated procedure well, VSS, report to RN 

## 2023-06-09 NOTE — Progress Notes (Unsigned)
[facility] History and Physical   Primary Care Physician:  Jackelyn Poling, DO   Reason for Procedure:  Colorectal cancer screening  Plan:    Screening colonoscopy with possible interventions as needed     HPI: Connie Vaughn is a very pleasant 47 y.o. female here for screening colonoscopy. Denies any nausea, vomiting, abdominal pain, melena or bright red blood per rectum  The risks and benefits as well as alternatives of endoscopic procedure(s) have been discussed and reviewed. All questions answered. The patient agrees to proceed.    Past Medical History:  Diagnosis Date   ADHD (attention deficit hyperactivity disorder)    AKI (acute kidney injury) (HCC) 10/06/2020   Anemia    Blood transfusion without reported diagnosis    Constipation    Diabetes mellitus type 1, uncontrolled    DKA (diabetic ketoacidosis) (HCC) 10/06/2020   GERD (gastroesophageal reflux disease)    Hyperlipidemia    Incontinence, feces     Past Surgical History:  Procedure Laterality Date   I & D EXTREMITY Right 03/09/2021   Procedure: IRRIGATION AND DEBRIDEMENT RIGHT RING FINGER;  Surgeon: Betha Loa, MD;  Location: [facility] OR;  Service: Orthopedics;  Laterality: Right;   TUBAL LIGATION      Prior to Admission medications   Medication Sig Start Date End Date Taking? Authorizing Provider  Continuous Glucose Sensor (FREESTYLE LIBRE 3 SENSOR) MISC 1 each every 14 (fourteen) days.   Yes [provider]  glucose blood test strip Use as instructed Patient taking differently: 1 each by Other route in the morning, at noon, and at bedtime. Use as instructed 11/18/17  Yes [name], NP  insulin degludec (TRESIBA FLEXTOUCH) 100 UNIT/ML FlexTouch Pen Inject 18 Units into the skin at bedtime. 08/22/19  Yes [provider]  Insulin Pen Needle (B-D UF III MINI PEN NEEDLES) 31G X 5 MM MISC Use as instructed Patient taking differently: 1 each by Other route in the morning, at noon,  and at bedtime. Use as instructed 11/18/17  Yes [name], NP  Lancets MISC 1 each by Does not apply route in the morning, at noon, and at bedtime. 07/09/13  Yes [provider]  LYUMJEV KWIKPEN 100 UNIT/ML KwikPen Inject 10-12 Units into the skin 3 (three) times daily with meals. 04/03/21  Yes [provider]  albuterol (VENTOLIN HFA) 108 (90 Base) MCG/ACT inhaler 1 puff as needed Inhalation every 4 hrs    [provider]  Azelastine HCl 137 MCG/SPRAY SOLN Place into both nostrils. 05/06/23   [provider]  cefadroxil (DURICEF) 500 MG capsule Take 500 mg by mouth 2 (two) times daily. Patient not taking: Reported on 05/19/2023 01/26/23   [provider]  doxycycline (VIBRA-TABS) 100 MG tablet Take 100 mg by mouth daily. 05/21/23   [provider]  EPINEPHrine 0.3 mg/0.3 mL IJ SOAJ injection SMARTSIG:Injection IM As Directed 05/06/23   [provider]  fexofenadine (ALLEGRA ALLERGY) 180 MG tablet Take 180 mg by mouth daily.    [provider]  mupirocin ointment (BACTROBAN) 2 % Apply 1 Application topically 2 (two) times daily. Patient not taking: Reported on 05/19/2023 03/09/23   Vivi Barrack, DPM  ondansetron (ZOFRAN) 4 MG tablet Take 1 tablet (4 mg total) by mouth every 8 (eight) hours as needed for nausea or vomiting. Patient not taking: Reported on 06/09/2023 01/16/23 01/16/24  Cathren Harsh, MD    Current Outpatient Medications  Medication Sig Dispense Refill   Continuous  Glucose Sensor (FREESTYLE LIBRE 3 SENSOR) MISC 1 each every 14 (fourteen) days.     glucose blood test strip Use as instructed (Patient taking differently: 1 each by Other route in the morning, at noon, and at bedtime. Use as instructed) 100 each 12   insulin degludec (TRESIBA FLEXTOUCH) 100 UNIT/ML FlexTouch Pen Inject 18 Units into the skin at bedtime.     Insulin Pen Needle (B-D UF III MINI PEN NEEDLES) 31G X 5 MM MISC Use as instructed  (Patient taking differently: 1 each by Other route in the morning, at noon, and at bedtime. Use as instructed) 100 each 2   Lancets MISC 1 each by Does not apply route in the morning, at noon, and at bedtime.     LYUMJEV KWIKPEN 100 UNIT/ML KwikPen Inject 10-12 Units into the skin 3 (three) times daily with meals.     albuterol (VENTOLIN HFA) 108 (90 Base) MCG/ACT inhaler 1 puff as needed Inhalation every 4 hrs     Azelastine HCl 137 MCG/SPRAY SOLN Place into both nostrils.     cefadroxil (DURICEF) 500 MG capsule Take 500 mg by mouth 2 (two) times daily. (Patient not taking: Reported on 05/19/2023)     doxycycline (VIBRA-TABS) 100 MG tablet Take 100 mg by mouth daily.     EPINEPHrine 0.3 mg/0.3 mL IJ SOAJ injection SMARTSIG:Injection IM As Directed     fexofenadine (ALLEGRA ALLERGY) 180 MG tablet Take 180 mg by mouth daily.     mupirocin ointment (BACTROBAN) 2 % Apply 1 Application topically 2 (two) times daily. (Patient not taking: Reported on 05/19/2023) 30 g 2   ondansetron (ZOFRAN) 4 MG tablet Take 1 tablet (4 mg total) by mouth every 8 (eight) hours as needed for nausea or vomiting. (Patient not taking: Reported on 06/09/2023) 20 tablet 0   Current Facility-Administered Medications  Medication Dose Route Frequency Provider Last Rate Last Admin   0.9 %  sodium chloride infusion  500 mL Intravenous Once Napoleon Form, MD        Allergies as of 06/09/2023 - Review Complete 06/09/2023  Allergen Reaction Noted   Sulfa antibiotics Hives 10/16/2017    Family History  Problem Relation Age of Onset   Heart disease Mother    Congestive Heart Failure Mother    Congestive Heart Failure Sister    Diabetes Maternal Grandmother    Colon polyps Neg Hx    Crohn's disease Neg Hx    Esophageal cancer Neg Hx    Rectal cancer Neg Hx    Stomach cancer Neg Hx     Social History   Socioeconomic History   Marital status: Married    Spouse name: Not on file   Number of children: Not on file    Years of education: Not on file   Highest education level: Not on file  Occupational History   Not on file  Tobacco Use   Smoking status: Some Days    Current packs/day: 0.25    Average packs/day: 0.3 packs/day for 2.0 years (0.5 ttl pk-yrs)    Types: Cigarettes   Smokeless tobacco: Never   Tobacco comments:    also uses Black and Mild  Vaping Use   Vaping status: Never Used  Substance and Sexual Activity   Alcohol use: Never   Drug use: Yes    Comment: CBD edibles   Sexual activity: Yes    Birth control/protection: Surgical    Comment: Tubal Ligation  Other Topics Concern   Not on  file  Social History Narrative   Not on file   Social Determinants of Health   Financial Resource Strain: Medium Risk (08/18/2022)   Received from [facility], [facility]   Overall Financial Resource Strain (CARDIA)    Difficulty of Paying Living Expenses: Somewhat hard  Food Insecurity: No Food Insecurity (03/08/2023)   Hunger Vital Sign    Worried About Running Out of Food in the Last Year: Never true    Ran Out of Food in the Last Year: Never true  Transportation Needs: No Transportation Needs (03/08/2023)   PRAPARE - Administrator, Civil Service (Medical): No    Lack of Transportation (Non-Medical): No  Physical Activity: Unknown (08/18/2022)   Received [facility]   Exercise Vital Sign    Days of Exercise per Week: 0 days    Minutes of Exercise per Session: Not on file  Recent Concern: Physical Activity - Inactive (08/18/2022)   Received from [facility], [facility]   Exercise Vital Sign    Days of Exercise per Week: 0 days    Minutes of Exercise per Session: 60 min  Stress: Stress Concern Present (08/18/2022)   Received from [facility], [facility] of Occupational Health - Occupational Stress Questionnaire    Feeling of Stress : To some extent  Social Connections: Somewhat Isolated (08/18/2022)   Received from [facility], [facility]   Social Network    How would you rate your social network (family, work, friends)?: Restricted participation with some degree of social isolation  Intimate Partner Violence: Not At Risk (03/08/2023)   Humiliation, Afraid, Rape, and Kick questionnaire    Fear of Current or Ex-Partner: No    Emotionally Abused: No    Physically Abused: No    Sexually Abused: No    Review of Systems:  All other review of systems negative except as mentioned in the HPI.  Physical Exam: Vital signs in last 24 hours: BP (!) 171/92   Pulse 95   Temp (!) 97.5 F (36.4 C) (Temporal)   Ht 5' 4.5" (1.638 m)   Wt 130 lb (59 kg)   SpO2 99%   BMI 21.97 kg/m  General:   Alert, NAD Lungs:  Clear .   Heart:  Regular rate and rhythm Abdomen:  Soft, nontender and nondistended. Neuro/Psych:  Alert and cooperative. Normal mood and affect. A and O x 3  Reviewed labs, radiology imaging, old records and pertinent past GI work up  Patient is appropriate for planned procedure(s) and anesthesia in an ambulatory setting   K. Scherry Ran , MD [phone]

## 2023-06-09 NOTE — Progress Notes (Unsigned)
Called to room to assist during endoscopic procedure.  Patient ID and intended procedure confirmed with present staff. Received instructions for my participation in the procedure from the performing physician.  

## 2023-06-09 NOTE — Op Note (Signed)
[facility] Patient Name: Connie Vaughn Procedure Date: 06/09/2023 10:03 AM MRN: [id number] Endoscopist: Napoleon Form , MD, [id number] Age: 47 Referring MD:  Date of Birth: 04-Nov-1975 Gender: Female Account #: [id number] Procedure:                Colonoscopy Indications:              Screening for colorectal malignant neoplasm Medicines:                Monitored Anesthesia Care Procedure:                Pre-Anesthesia Assessment:                           - Prior to the procedure, a History and Physical                            was performed, and patient medications and                            allergies were reviewed. The patient's tolerance of                            previous anesthesia was also reviewed. The risks                            and benefits of the procedure and the sedation                            options and risks were discussed with the patient.                            All questions were answered, and informed consent                            was obtained. Prior Anticoagulants: The patient has                            taken no anticoagulant or antiplatelet agents. ASA                            Grade Assessment: II - A patient with mild systemic                            disease. After reviewing the risks and benefits,                            the patient was deemed in satisfactory condition to                            undergo the procedure.                           After obtaining informed consent, the colonoscope  was passed under direct vision. Throughout the                            procedure, the patient's blood pressure, pulse, and                            oxygen saturations were monitored continuously. The                            Olympus Scope SN [id number] was introduced through the                            anus and advanced to the the cecum, identified by                             appendiceal orifice and ileocecal valve. The                            colonoscopy was performed without difficulty. The                            patient tolerated the procedure well. The quality                            of the bowel preparation was adequate. The                            ileocecal valve, appendiceal orifice, and rectum                            were photographed. Scope In: 10:08:09 AM Scope Out: 10:26:07 AM Scope Withdrawal Time: 0 hours 11 minutes 32 seconds  Total Procedure Duration: 0 hours 17 minutes 58 seconds  Findings:                 The perianal and digital rectal examinations were                            normal.                           A 4 mm polyp was found in the ascending colon. The                            polyp was sessile. The polyp was removed with a                            cold snare. Resection and retrieval were complete.                           Non-bleeding external and internal hemorrhoids were                            found during retroflexion. The hemorrhoids were  medium-sized. Complications:            No immediate complications. Estimated Blood Loss:     Estimated blood loss was minimal. Impression:               - One 4 mm polyp in the ascending colon, removed                            with a cold snare. Resected and retrieved.                           - Non-bleeding external and internal hemorrhoids. Recommendation:           - Patient has a contact number available for                            emergencies. The signs and symptoms of potential                            delayed complications were discussed with the                            patient. Return to normal activities tomorrow.                            Written discharge instructions were provided to the                            patient.                           - Resume previous diet.                           - Continue  present medications.                           - Await pathology results.                           - Repeat colonoscopy in 5-10 years for surveillance                            based on pathology results. Napoleon Form, MD 06/09/2023 10:34:11 AM This report has been signed electronically.

## 2023-06-10 ENCOUNTER — Telehealth: Payer: Self-pay

## 2023-06-10 NOTE — Telephone Encounter (Signed)
Unable to leave message on follow up call. Mailbox is full per voicemail.

## 2023-06-11 LAB — SURGICAL PATHOLOGY

## 2023-06-27 ENCOUNTER — Encounter (HOSPITAL_COMMUNITY): Payer: Self-pay

## 2023-06-27 ENCOUNTER — Other Ambulatory Visit: Payer: Self-pay

## 2023-06-27 ENCOUNTER — Observation Stay (HOSPITAL_COMMUNITY)
Admission: EM | Admit: 2023-06-27 | Discharge: 2023-06-27 | Disposition: A | Discharge status: Home / Self Care | Payer: Managed Care, Other (non HMO) | Attending: Internal Medicine | Admitting: Internal Medicine

## 2023-06-27 DIAGNOSIS — R1013 Epigastric pain: Secondary | ICD-10-CM | POA: Diagnosis not present

## 2023-06-27 DIAGNOSIS — E861 Hypovolemia: Secondary | ICD-10-CM | POA: Diagnosis present

## 2023-06-27 DIAGNOSIS — R824 Acetonuria: Secondary | ICD-10-CM | POA: Diagnosis present

## 2023-06-27 DIAGNOSIS — Z8249 Family history of ischemic heart disease and other diseases of the circulatory system: Secondary | ICD-10-CM

## 2023-06-27 DIAGNOSIS — R197 Diarrhea, unspecified: Principal | ICD-10-CM | POA: Diagnosis present

## 2023-06-27 DIAGNOSIS — E785 Hyperlipidemia, unspecified: Secondary | ICD-10-CM | POA: Diagnosis present

## 2023-06-27 DIAGNOSIS — Z794 Long term (current) use of insulin: Secondary | ICD-10-CM

## 2023-06-27 DIAGNOSIS — I959 Hypotension, unspecified: Secondary | ICD-10-CM | POA: Diagnosis present

## 2023-06-27 DIAGNOSIS — E1065 Type 1 diabetes mellitus with hyperglycemia: Secondary | ICD-10-CM | POA: Diagnosis not present

## 2023-06-27 DIAGNOSIS — Z79899 Other long term (current) drug therapy: Secondary | ICD-10-CM | POA: Diagnosis not present

## 2023-06-27 DIAGNOSIS — F909 Attention-deficit hyperactivity disorder, unspecified type: Secondary | ICD-10-CM | POA: Diagnosis present

## 2023-06-27 DIAGNOSIS — E860 Dehydration: Secondary | ICD-10-CM | POA: Diagnosis present

## 2023-06-27 DIAGNOSIS — K529 Noninfective gastroenteritis and colitis, unspecified: Secondary | ICD-10-CM | POA: Diagnosis present

## 2023-06-27 DIAGNOSIS — E872 Acidosis, unspecified: Secondary | ICD-10-CM | POA: Diagnosis present

## 2023-06-27 DIAGNOSIS — Z882 Allergy status to sulfonamides status: Secondary | ICD-10-CM | POA: Diagnosis not present

## 2023-06-27 DIAGNOSIS — F17210 Nicotine dependence, cigarettes, uncomplicated: Secondary | ICD-10-CM | POA: Diagnosis present

## 2023-06-27 LAB — CBC WITH DIFFERENTIAL/PLATELET
Abs Immature Granulocytes: 0.02 10*3/uL (ref 0.00–0.07)
Basophils Absolute: 0.1 10*3/uL (ref 0.0–0.1)
Basophils Relative: 1 %
Eosinophils Absolute: 0 10*3/uL (ref 0.0–0.5)
Eosinophils Relative: 0 %
HCT: 35.9 % — ABNORMAL LOW (ref 36.0–46.0)
Hemoglobin: 12 g/dL (ref 12.0–15.0)
Immature Granulocytes: 0 %
Lymphocytes Relative: 24 %
Lymphs Abs: 1.8 10*3/uL (ref 0.7–4.0)
MCH: 31.3 pg (ref 26.0–34.0)
MCHC: 33.4 g/dL (ref 30.0–36.0)
MCV: 93.7 fL (ref 80.0–100.0)
Monocytes Absolute: 0.4 10*3/uL (ref 0.1–1.0)
Monocytes Relative: 6 %
Neutro Abs: 5.2 10*3/uL (ref 1.7–7.7)
Neutrophils Relative %: 69 %
Platelets: 410 10*3/uL — ABNORMAL HIGH (ref 150–400)
RBC: 3.83 MIL/uL — ABNORMAL LOW (ref 3.87–5.11)
RDW: 14.4 % (ref 11.5–15.5)
WBC: 7.6 10*3/uL (ref 4.0–10.5)
nRBC: 0 % (ref 0.0–0.2)

## 2023-06-27 LAB — BLOOD GAS, VENOUS
Acid-base deficit: 8.7 mmol/L — ABNORMAL HIGH (ref 0.0–2.0)
Bicarbonate: 16.6 mmol/L — ABNORMAL LOW (ref 20.0–28.0)
O2 Saturation: 91.1 %
Patient temperature: 37
pCO2, Ven: 33 mm[Hg] — ABNORMAL LOW (ref 44–60)
pH, Ven: 7.31 (ref 7.25–7.43)
pO2, Ven: 59 mm[Hg] — ABNORMAL HIGH (ref 32–45)

## 2023-06-27 LAB — URINALYSIS, ROUTINE W REFLEX MICROSCOPIC
Bacteria, UA: NONE SEEN
Bilirubin Urine: NEGATIVE
Glucose, UA: >=500 mg/dL — AB
Ketones, ur: 80 mg/dL — AB
Leukocytes,Ua: NEGATIVE
Nitrite: NEGATIVE
Protein, ur: >=300 mg/dL — AB
Specific Gravity, Urine: 1.014 (ref 1.005–1.030)
pH: 5 (ref 5.0–8.0)

## 2023-06-27 LAB — COMPREHENSIVE METABOLIC PANEL
ALT: 19 U/L (ref 0–44)
AST: 23 U/L (ref 15–41)
Albumin: 3.4 g/dL — ABNORMAL LOW (ref 3.5–5.0)
Alkaline Phosphatase: 112 U/L (ref 38–126)
Anion gap: 20 — ABNORMAL HIGH (ref 5–15)
BUN: 23 mg/dL — ABNORMAL HIGH (ref 6–20)
CO2: 14 mmol/L — ABNORMAL LOW (ref 22–32)
Calcium: 9.1 mg/dL (ref 8.9–10.3)
Chloride: 101 mmol/L (ref 98–111)
Creatinine, Ser: 1.19 mg/dL — ABNORMAL HIGH (ref 0.44–1.00)
GFR, Estimated: 57 mL/min — ABNORMAL LOW (ref >60)
Glucose, Bld: 342 mg/dL — ABNORMAL HIGH (ref 70–99)
Potassium: 3.6 mmol/L (ref 3.5–5.1)
Sodium: 135 mmol/L (ref 135–145)
Total Bilirubin: 1.8 mg/dL — ABNORMAL HIGH (ref <1.2)
Total Protein: 6.8 g/dL (ref 6.5–8.1)

## 2023-06-27 LAB — BASIC METABOLIC PANEL
Anion gap: 13 (ref 5–15)
BUN: 18 mg/dL (ref 6–20)
CO2: 19 mmol/L — ABNORMAL LOW (ref 22–32)
Calcium: 8.1 mg/dL — ABNORMAL LOW (ref 8.9–10.3)
Chloride: 106 mmol/L (ref 98–111)
Creatinine, Ser: 0.8 mg/dL (ref 0.44–1.00)
GFR, Estimated: >60 mL/min (ref >60)
Glucose, Bld: 119 mg/dL — ABNORMAL HIGH (ref 70–99)
Potassium: 3.9 mmol/L (ref 3.5–5.1)
Sodium: 138 mmol/L (ref 135–145)

## 2023-06-27 LAB — BETA-HYDROXYBUTYRIC ACID
Beta-Hydroxybutyric Acid: 7.27 mmol/L — ABNORMAL HIGH (ref 0.05–0.27)
Beta-Hydroxybutyric Acid: 4.17 mmol/L — ABNORMAL HIGH (ref 0.05–0.27)

## 2023-06-27 LAB — CBG MONITORING, ED
Glucose-Capillary: 230 mg/dL — ABNORMAL HIGH (ref 70–99)
Glucose-Capillary: 78 mg/dL (ref 70–99)
Glucose-Capillary: 369 mg/dL — ABNORMAL HIGH (ref 70–99)

## 2023-06-27 LAB — I-STAT CG4 LACTIC ACID, ED
Lactic Acid, Venous: 4.2 mmol/L (ref 0.5–1.9)
Lactic Acid, Venous: 1 mmol/L (ref 0.5–1.9)

## 2023-06-27 LAB — MAGNESIUM: Magnesium: 2.2 mg/dL (ref 1.7–2.4)

## 2023-06-27 LAB — PREGNANCY, URINE: Preg Test, Ur: NEGATIVE

## 2023-06-27 LAB — PHOSPHORUS: Phosphorus: 3.9 mg/dL (ref 2.5–4.6)

## 2023-06-27 MED ORDER — LOPERAMIDE HCL 2 MG PO CAPS
2.0000 mg | ORAL_CAPSULE | ORAL | Status: AC
  Administered 2023-06-27: 2 mg via ORAL
  Filled 2023-06-27: qty 1

## 2023-06-27 MED ORDER — INSULIN REGULAR(HUMAN) IN NACL 100-0.9 UT/100ML-% IV SOLN: INTRAVENOUS | Status: DC

## 2023-06-27 MED ORDER — DEXTROSE 50 % IV SOLN: 0.0000 mL | INTRAVENOUS | Status: DC | PRN

## 2023-06-27 MED ORDER — LACTATED RINGERS IV SOLN: INTRAVENOUS | Status: DC

## 2023-06-27 MED ORDER — POTASSIUM CHLORIDE 10 MEQ/100ML IV SOLN: 10.0000 meq | INTRAVENOUS | Status: AC

## 2023-06-27 MED ORDER — INSULIN ASPART 100 UNIT/ML IJ SOLN
0.0000 [IU] | Freq: Every day | INTRAMUSCULAR | Status: DC
  Administered 2023-06-27: 5 [IU] via SUBCUTANEOUS
  Filled 2023-06-27: qty 0.05

## 2023-06-27 MED ORDER — LOPERAMIDE HCL 2 MG PO CAPS: 2.0000 mg | ORAL_CAPSULE | ORAL | 0 refills | Status: DC | PRN

## 2023-06-27 MED ORDER — DEXTROSE IN LACTATED RINGERS 5 % IV SOLN: INTRAVENOUS | Status: DC

## 2023-06-27 MED ORDER — ACETAMINOPHEN 325 MG PO TABS: 650.0000 mg | ORAL_TABLET | Freq: Four times a day (QID) | ORAL | Status: DC | PRN

## 2023-06-27 MED ORDER — LACTATED RINGERS IV BOLUS: 20.0000 mL/kg | Freq: Once | INTRAVENOUS | Status: DC

## 2023-06-27 MED ORDER — SODIUM CHLORIDE 0.9 % IV BOLUS
1000.0000 mL | Freq: Once | INTRAVENOUS | Status: AC
Start: 2023-06-27 — End: 2023-06-27
  Administered 2023-06-27: 1000 mL via INTRAVENOUS

## 2023-06-27 MED ORDER — INSULIN ASPART 100 UNIT/ML IJ SOLN
0.0000 [IU] | Freq: Three times a day (TID) | INTRAMUSCULAR | Status: DC
  Filled 2023-06-27: qty 0.09

## 2023-06-27 MED ORDER — MELATONIN 5 MG PO TABS: 5.0000 mg | ORAL_TABLET | Freq: Every evening | ORAL | Status: DC | PRN

## 2023-06-27 MED ORDER — SODIUM CHLORIDE 0.9 % IV BOLUS
1000.0000 mL | Freq: Once | INTRAVENOUS | Status: AC
  Administered 2023-06-27: 1000 mL via INTRAVENOUS

## 2023-06-27 MED ORDER — PROCHLORPERAZINE EDISYLATE 10 MG/2ML IJ SOLN: 5.0000 mg | Freq: Four times a day (QID) | INTRAMUSCULAR | Status: DC | PRN

## 2023-06-27 NOTE — ED Notes (Signed)
Pt able to ambulate with 1 person assist to restroom

## 2023-06-27 NOTE — ED Notes (Signed)
Pt was able to ambulate to restroom with no assistant. 

## 2023-06-27 NOTE — Discharge Summary (Signed)
Discharge Summary  Connie Vaughn VWU:[id number] DOB: July 26, 1975  PCP: Jackelyn Poling, DO  Admit date: 06/27/2023 Discharge date: 06/27/2023  Time spent: 35 minutes   Recommendations for Outpatient Follow-up:  Follow-up with GI [facility] Dr. Tomasa Rand within a week. Follow-up with your primary care provider Dr. Atha Starks within a week. Take your medications as prescribed. Avoid dehydration.  Discharge Diagnoses:  Active Hospital Problems   Diagnosis Date Noted   Intractable diarrhea 06/27/2023    Resolved Hospital Problems  No resolved problems to display.   Diet recommendation: Carb modified diet.  Vitals:   06/27/23 1807 06/27/23 1815  BP: 133/85 (!) 140/87  Pulse: (!) 101 (!) 101  Resp: 12 12  Temp: 98 F (36.7 C)   SpO2: 98% 98%    History of present illness:   Connie Vaughn is a 47 y.o. female with medical history significant for type 1 diabetes, chronic diarrhea for months with more than 6 watery stools per day on average, recent screening colonoscopy on 06/09/2023 by Dr. Baxter Kail of [facility] (showed ascending colon polyp (tubular adenoma), no high-grade dysplasia or malignancy identified), who presents today due to worsening diarrhea, more than 8-10 watery stools per day to the point where she has to wear a diaper at work as she cannot always make it to the bathroom on time.  Associated with epigastric tenderness with palpation.  Denies frequent use of NSAIDs.  Denies nausea or vomiting.  Admits to poor appetite.  No subjective fevers or chills.   In the ED, the patient was hypotensive with MAP of 62 and tachycardic on presentation.  Hypovolemic on exam.   Lab studies were notable for elevated serum glucose 342, serum bicarb 14 with anion gap of 20, lactic acidosis 4.2, elevated beta hydroxybutyrate acid 7.27 with ketonuria.  The patient received 2 L IV fluid boluses with improvement of her hypotension and metabolic acidosis.   At the time of this exam, the  patient is feeling better and is requesting to be discharged to home and follow-up with GI outpatient.  She states she is worried about losing her job because she has missed many days due to her chronic diarrhea.  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   Repeated labs were improved with lactic acid 1.0, serum bicarb 19 with anion gap of 13, serum glucose of 119.   Discussed the case with Dr. Tomasa Rand, [facility] on-call, who recommended Imodium as needed as her chronic diarrhea is less likely infectious.  [facility] will contact the patient on Monday, 06/29/2023 to arrange for an appointment within a week.  The patient was made aware of this plan.  She also understands that if her condition worsens she should return to the ER.   ED Course: Temperature 98.  Repeated BP 140/87, pulse 97, respiratory 16, saturation 100% on room air.  Lab studies WBC 7.6, hemoglobin 12.0, platelet count 410.  Hospital Course:  Principal Problem:   Intractable diarrhea  Intractable diarrhea, chronic Endorses having diarrhea for months Afebrile with no leukocytosis Less likely infectious Imodium as needed Avoid NSAIDs Encouraged oral fluid hydration Follow-up with  GI outpatient within a week.   Type 1 diabetes with hyperglycemia Last hemoglobin A1c 11.4 Follow-up with your PCP within a week   High anion gap metabolic acidosis in the setting of severe dehydration and mild DKA, now resolved Lab studies were notable for elevated serum glucose 342, serum bicarb 14 with anion  gap of 20, lactic acidosis 4.2, elevated beta hydroxybutyrate acid 7.27 with ketonuria.  The patient received 2 L IV fluid boluses with improvement of her hypotension and metabolic acidosis.   Repeated labs were improved with lactic acid 1.0, serum bicarb 19 with anion gap of 13, serum glucose of 119, beta hydroxybutyrate acid 4.17.   Epigastric tenderness on palpation Denies frequent use of  alcohol Denies nausea or vomiting Denies frequent use of NSAIDs Avoid NSAIDs Follow-up with [facility] outpatient within a week.     Time: 75 minutes.   Code Status: Full code.   Family Communication: Patient's husband at bedside.   Disposition Plan: The patient declined admission to the hospital and requesting to be discharged from the ER.   Consults called: Discussed the case with Dr. Tomasa Rand, [facility], via phone.    Discharge Exam: BP (!) 140/87   Pulse (!) 101   Temp 98 F (36.7 C)   Resp 12   Ht 5\' 4"  (1.626 m)   Wt 62.1 kg   SpO2 98%   BMI 23.52 kg/m  General: 47 y.o. year-old female well developed well nourished in no acute distress.  Alert and oriented x3. Cardiovascular: Regular rate and rhythm with no rubs or gallops.  No thyromegaly or JVD noted.  No lower extremity edema. 2/4 pulses in all 4 extremities. Respiratory: Clear to auscultation with no wheezes or rales. Good inspiratory effort. Abdomen: Soft, epigastric tenderness with palpation.  Nondistended with normal bowel sounds x4 quadrants. Muskuloskeletal: No cyanosis, clubbing or edema noted bilaterally Neuro: CN II-XII intact, strength, sensation, reflexes Skin: No ulcerative lesions noted or rashes Psychiatry: Judgement and insight appear normal. Mood is appropriate for condition and setting.  Discharge Instructions You were cared for by a hospitalist during your hospital stay. If you have any questions about your discharge medications or the care you received while you were in the hospital after you are discharged, you can call the unit and asked to speak with the hospitalist on call if the hospitalist that took care of you is not available. Once you are discharged, your primary care physician will handle any further medical issues. Please note that NO REFILLS for any discharge medications will be authorized once you are discharged, as it is imperative that you return to your primary care physician (or  establish a relationship with a primary care physician if you do not have one) for your aftercare needs so that they can reassess your need for medications and monitor your lab values.   Allergies as of 06/27/2023       Reactions   Sulfa Antibiotics Hives        Medication List     STOP taking these medications    albuterol 108 (90 Base) MCG/ACT inhaler Commonly known as: VENTOLIN HFA   Allegra Allergy 180 MG tablet Generic drug: fexofenadine   Azelastine HCl 137 MCG/SPRAY Soln   EPINEPHrine 0.3 mg/0.3 mL Soaj injection Commonly known as: EPI-PEN   mupirocin ointment 2 % Commonly known as: BACTROBAN       TAKE these medications    FreeStyle Libre 3 Sensor Misc 1 each every 14 (fourteen) days.   glucose blood test strip Use as instructed   Insulin Pen Needle 31G X 5 MM Misc Commonly known as: B-D UF III MINI PEN NEEDLES Use as instructed   Lancets Misc 1 each by Does not apply route in the morning, at noon, and at bedtime.   loperamide 2 MG capsule Commonly  known as: IMODIUM Take 1 capsule (2 mg total) by mouth as needed for diarrhea or loose stools.   Lyumjev KwikPen 100 UNIT/ML KwikPen Generic drug: Insulin Lispro-aabc Inject 10-12 Units into the skin 3 (three) times daily with meals.   [name] FlexTouch 100 UNIT/ML FlexTouch Pen Generic drug: insulin degludec Inject 18 Units into the skin at bedtime.       Allergies  Allergen Reactions   Sulfa Antibiotics Hives    Follow-up Information     Jenel Lucks, MD. Call today.   Specialty: Gastroenterology Why: Please call on Monday, 06/29/2023, for posthospital follow-up appointment. Contact information: [address] [place] [address] [phone]         Jackelyn Poling, DO. Call today.   Specialty: Family Medicine Why: Please call for a posthospital follow-up appointment. Contact information: [address] [place] [address] [phone]                   The results of significant diagnostics from this hospitalization (including imaging, microbiology, ancillary and laboratory) are listed below for reference.    Significant Diagnostic Studies: No results found.  Microbiology: No results found for this or any previous visit (from the past 240 hours).   Labs: Basic Metabolic Panel: Recent Labs  Lab 06/27/23 1640 06/27/23 2008  NA 135 138  K 3.6 3.9  CL 101 106  CO2 14* 19*  GLUCOSE 342* 119*  BUN 23* 18  CREATININE 1.19* 0.80  CALCIUM 9.1 8.1*  MG 2.2  --   PHOS 3.9  --    Liver Function Tests: Recent Labs  Lab 06/27/23 1640  AST 23  ALT 19  ALKPHOS 112  BILITOT 1.8*  PROT 6.8  ALBUMIN 3.4*   No results for input(s): "LIPASE", "AMYLASE" in the last 168 hours. No results for input(s): "AMMONIA" in the last 168 hours. CBC: Recent Labs  Lab 06/27/23 1640  WBC 7.6  NEUTROABS 5.2  HGB 12.0  HCT 35.9*  MCV 93.7  PLT 410*   Cardiac Enzymes: No results for input(s): "CKTOTAL", "CKMB", "CKMBINDEX", "TROPONINI" in the last 168 hours. BNP: BNP (last 3 results) No results for input(s): "BNP" in the last 8760 hours.  ProBNP (last 3 results) No results for input(s): "PROBNP" in the last 8760 hours.  CBG: Recent Labs  Lab 06/27/23 1714 06/27/23 1918  GLUCAP 230* 78       Signed:  [name], MD [facility] 06/27/2023, 9:46 PM

## 2023-06-27 NOTE — ED Triage Notes (Signed)
Pt complaining of fatigue, nausea, diarrhea, and pain with bowel movements. Noted that pt has BP of 87/50. Pt has hx of being severely dehydrated related to uncontrolled BMS's.

## 2023-06-27 NOTE — ED Notes (Signed)
Patient is being discharged per Dr Margo Aye d/t patient having to be at work tomorrow or fears being fired.

## 2023-06-27 NOTE — H&P (Addendum)
History and Physical  Connie Vaughn ZOX:[id number] DOB: 1976-05-21 DOA: 06/27/2023  Referring physician: Dr. Theresia Lo, EDP  PCP: Jackelyn Poling, DO  Outpatient Specialists: [facility]. Patient coming from: Home  Chief Complaint: Worsening diarrhea  HPI: Connie Vaughn is a 47 y.o. female with medical history significant for type 1 diabetes, chronic diarrhea for months with more than 6 watery stools per day on average, recent screening colonoscopy on 06/09/2023 by Dr. Baxter Kail of [facility] (showed ascending colon polyp (tubular adenoma), no high-grade dysplasia or malignancy identified), who presents today due to worsening diarrhea, more than 8-10 watery stools per day to the point where she has to wear a diaper at work as she cannot always make it to the bathroom on time.  Associated with epigastric tenderness with palpation.  Denies frequent use of NSAIDs.  Denies nausea or vomiting.  Admits to poor appetite.  No subjective fevers or chills.  In the ED, the patient was hypotensive with MAP of 62 and tachycardic on presentation.  Hypovolemic on exam.  Lab studies were notable for elevated serum glucose 342, serum bicarb 14 with anion gap of 20, lactic acidosis 4.2, elevated beta hydroxybutyrate acid 7.27 with ketonuria.  The patient received 2 L IV fluid boluses with improvement of her hypotension and metabolic acidosis.  At the time of this exam, the patient is feeling better and is requesting to be discharged to home and follow-up with GI outpatient.  She states she is worried about losing her job because she has missed many days due to her chronic diarrhea.  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  Repeated labs were improved with lactic acid 1.0, serum bicarb 19 with anion gap of 13, serum glucose of 119.  Discussed the case with Dr. Tomasa Rand, [facility] on-call, who recommended Imodium as needed as her chronic diarrhea is less likely infectious.   [facility] will contact the patient on Monday, 06/29/2023 to arrange for an appointment within a week.  The patient was made aware of this plan.  She also understands that if her condition worsens she should return to the ER.  ED Course: Temperature 98.  Repeated BP 140/87, pulse 97, respiratory 16, saturation 100% on room air.  Lab studies WBC 7.6, hemoglobin 12.0, platelet count 410.  Review of Systems: Review of systems as noted in the HPI. All other systems reviewed and are negative.   Past Medical History:  Diagnosis Date   ADHD (attention deficit hyperactivity disorder)    AKI (acute kidney injury) (HCC) 10/06/2020   Anemia    Blood transfusion without reported diagnosis    Constipation    Diabetes mellitus type 1, uncontrolled    DKA (diabetic ketoacidosis) (HCC) 10/06/2020   GERD (gastroesophageal reflux disease)    Hyperlipidemia    Incontinence, feces    Past Surgical History:  Procedure Laterality Date   I & D EXTREMITY Right 03/09/2021   Procedure: IRRIGATION AND DEBRIDEMENT RIGHT RING FINGER;  Surgeon: Betha Loa, MD;  Location: [facility] OR;  Service: Orthopedics;  Laterality: Right;   TUBAL LIGATION      Social History:  reports that she has been smoking cigarettes. She has a 0.5 pack-year smoking history. She has never used smokeless tobacco. She reports current drug use. She reports that she does not drink alcohol.   Allergies  Allergen Reactions   Sulfa Antibiotics Hives    Family History  Problem Relation Age of Onset  Heart disease Mother    Congestive Heart Failure Mother    Congestive Heart Failure Sister    Diabetes Maternal Grandmother    Colon polyps Neg Hx    Crohn's disease Neg Hx    Esophageal cancer Neg Hx    Rectal cancer Neg Hx    Stomach cancer Neg Hx       Prior to Admission medications   Medication Sig Start Date End Date Taking? Authorizing Provider  Continuous Glucose Sensor (FREESTYLE LIBRE 3 SENSOR) MISC 1 each every 14 (fourteen)  days.   Yes [provider]  EPINEPHrine 0.3 mg/0.3 mL IJ SOAJ injection SMARTSIG:Injection IM As Directed 05/06/23  Yes [provider]  insulin degludec (TRESIBA FLEXTOUCH) 100 UNIT/ML FlexTouch Pen Inject 18 Units into the skin at bedtime. 08/22/19  Yes [provider]  LYUMJEV KWIKPEN 100 UNIT/ML KwikPen Inject 10-12 Units into the skin 3 (three) times daily with meals. 04/03/21  Yes [provider]  albuterol (VENTOLIN HFA) 108 (90 Base) MCG/ACT inhaler 1 puff as needed Inhalation every 4 hrs Patient not taking: Reported on 06/27/2023    [provider]  Azelastine HCl 137 MCG/SPRAY SOLN Place into both nostrils. Patient not taking: Reported on 06/27/2023 05/06/23   [provider]  fexofenadine (ALLEGRA ALLERGY) 180 MG tablet Take 180 mg by mouth daily. Patient not taking: Reported on 06/27/2023    [provider]  glucose blood test strip Use as instructed Patient taking differently: 1 each by Other route in the morning, at noon, and at bedtime. Use as instructed 11/18/17   [name], NP  Insulin Pen Needle (B-D UF III MINI PEN NEEDLES) 31G X 5 MM MISC Use as instructed Patient taking differently: 1 each by Other route in the morning, at noon, and at bedtime. Use as instructed 11/18/17   [name], NP  Lancets MISC 1 each by Does not apply route in the morning, at noon, and at bedtime. 07/09/13   [provider]  mupirocin ointment (BACTROBAN) 2 % Apply 1 Application topically 2 (two) times daily. Patient not taking: Reported on 05/19/2023 03/09/23   Vivi Barrack, DPM    Physical Exam: BP (!) 140/87   Pulse (!) 101   Temp 98 F (36.7 C)   Resp 12   Ht 5\' 4"  (1.626 m)   Wt 62.1 kg   SpO2 98%   BMI 23.52 kg/m   General: 47 y.o. year-old female well developed well nourished in no acute distress.  Alert and oriented x3. Cardiovascular: Regular rate and rhythm with no rubs or gallops.  No thyromegaly  or JVD noted.  No lower extremity edema. 2/4 pulses in all 4 extremities. Respiratory: Clear to auscultation with no wheezes or rales. Good inspiratory effort. Abdomen: Soft, epigastric tenderness with palpation.  Nondistended with normal bowel sounds x4 quadrants. Muskuloskeletal: No cyanosis, clubbing or edema noted bilaterally Neuro: CN II-XII intact, strength, sensation, reflexes Skin: No ulcerative lesions noted or rashes Psychiatry: Judgement and insight appear normal. Mood is appropriate for condition and setting          Labs on Admission:  Basic Metabolic Panel: Recent Labs  Lab 06/27/23 1640  NA 135  K 3.6  CL 101  CO2 14*  GLUCOSE 342*  BUN 23*  CREATININE 1.19*  CALCIUM 9.1  MG 2.2  PHOS 3.9   Liver Function Tests: Recent Labs  Lab 06/27/23 1640  AST 23  ALT 19  ALKPHOS 112  BILITOT 1.8*  PROT 6.8  ALBUMIN 3.4*   No results for input(s): "LIPASE", "AMYLASE" in the last 168 hours. No results for input(s): "AMMONIA" in the last 168 hours. CBC: Recent Labs  Lab 06/27/23 1640  WBC 7.6  NEUTROABS 5.2  HGB 12.0  HCT 35.9*  MCV 93.7  PLT 410*   Cardiac Enzymes: No results for input(s): "CKTOTAL", "CKMB", "CKMBINDEX", "TROPONINI" in the last 168 hours.  BNP (last 3 results) No results for input(s): "BNP" in the last 8760 hours.  ProBNP (last 3 results) No results for input(s): "PROBNP" in the last 8760 hours.  CBG: Recent Labs  Lab 06/27/23 1714  GLUCAP 230*    Radiological Exams on Admission: No results found.  EKG: I independently viewed the EKG done and my findings are as followed: Sinus rhythm rate of 99.  Nonspecific ST-T changes.  QTc 491.  Assessment/Plan Present on Admission:  Intractable diarrhea  Principal Problem:   Intractable diarrhea  Intractable diarrhea, chronic Endorses having diarrhea for months Afebrile with no leukocytosis Less likely infectious Imodium as needed Avoid NSAIDs Encouraged oral fluid  hydration Follow-up with [facility] outpatient within a week.  Type 1 diabetes with hyperglycemia Last hemoglobin A1c 11.4 Follow-up with your PCP within a week  High anion gap metabolic acidosis in the setting of severe dehydration and mild DKA, now resolved Lab studies were notable for elevated serum glucose 342, serum bicarb 14 with anion gap of 20, lactic acidosis 4.2, elevated beta hydroxybutyrate acid 7.27 with ketonuria.  The patient received 2 L IV fluid boluses with improvement of her hypotension and metabolic acidosis.  Repeated labs were improved with lactic acid 1.0, serum bicarb 19 with anion gap of 13, serum glucose of 119, beta hydroxybutyrate acid 4.17.  Epigastric tenderness on palpation Denies frequent use of alcohol Denies nausea or vomiting Denies frequent use of NSAIDs Avoid NSAIDs Follow-up with [facility] outpatient within a week.   Time: 75 minutes.  Code Status: Full code.  Family Communication: Patient's husband at bedside.  Disposition Plan: The patient declined admission to the hospital and requesting to be discharged from the ER.  Consults called: Discussed the case with Dr. Tomasa Rand, [facility], via phone.    [name] MD [facility] Pager [phone]  If 7PM-7AM, please contact night-coverage [email or web address] Password [facility]  06/27/2023, 7:47 PM

## 2023-06-27 NOTE — ED Provider Notes (Signed)
[place] EMERGENCY DEPARTMENT AT [facility] Provider Note   CSN: [id number] Arrival date & time: 06/27/23  1622     History  Chief Complaint  Patient presents with   Hypotension   Diarrhea    Connie Vaughn is a 47 y.o. female.  Patient is a 47 year old female with a past medical history of type 1 diabetes presenting to the emergency department with diarrhea and weakness.  Patient states that she has had uncontrollable diarrhea for the last several months and that she is not sure of the cause.  She states that she will have diarrhea several times a day and was unable to make it to the bathroom in time and has been having to wear diapers.  She states that stools are mixed between loose and diarrhea.  She denies any difficulty with urination.  She denies any associated back pain or numbness or weakness in her legs, saddle anesthesia.  She states that she does get diffuse abdominal cramping prior to the diarrhea.  She states that she has been nauseous today but has not had any vomiting.  She states that she feels generally weak and fatigued today.  She states her blood sugars were running high.  The history is provided by the patient.  Diarrhea      Home Medications Prior to Admission medications   Medication Sig Start Date End Date Taking? Authorizing Provider  Continuous Glucose Sensor (FREESTYLE LIBRE 3 SENSOR) MISC 1 each every 14 (fourteen) days.   Yes [provider]  insulin degludec (TRESIBA FLEXTOUCH) 100 UNIT/ML FlexTouch Pen Inject 18 Units into the skin at bedtime. 08/22/19  Yes [provider]  LYUMJEV KWIKPEN 100 UNIT/ML KwikPen Inject 10-12 Units into the skin 3 (three) times daily with meals. 04/03/21  Yes [provider]  glucose blood test strip Use as instructed Patient taking differently: 1 each by Other route in the morning, at noon, and at bedtime. Use as instructed 11/18/17   [name], NP  Insulin Pen Needle (B-D UF  III MINI PEN NEEDLES) 31G X 5 MM MISC Use as instructed Patient taking differently: 1 each by Other route in the morning, at noon, and at bedtime. Use as instructed 11/18/17   [name], NP  Lancets MISC 1 each by Does not apply route in the morning, at noon, and at bedtime. 07/09/13   [provider]  loperamide (IMODIUM) 2 MG capsule Take 1 capsule (2 mg total) by mouth as needed for diarrhea or loose stools. 06/27/23   [name], DO      Allergies    Sulfa antibiotics    Review of Systems   Review of Systems  Gastrointestinal:  Positive for diarrhea.    Physical Exam Updated Vital Signs BP (!) 140/87   Pulse (!) 101   Temp 98 F (36.7 C)   Resp 12   Ht 5\' 4"  (1.626 m)   Wt 62.1 kg   SpO2 98%   BMI 23.52 kg/m  Physical Exam Vitals and nursing note reviewed.  Constitutional:      General: She is not in acute distress.    Appearance: Normal appearance. She is ill-appearing. She is not toxic-appearing.  HENT:     Head: Normocephalic and atraumatic.     Nose: Nose normal.     Mouth/Throat:     Mouth: Mucous membranes are moist.     Pharynx: Oropharynx is clear.  Eyes:     Extraocular Movements: Extraocular  movements intact.     Conjunctiva/sclera: Conjunctivae normal.  Cardiovascular:     Rate and Rhythm: Regular rhythm. Tachycardia present.     Heart sounds: Normal heart sounds.  Pulmonary:     Effort: Pulmonary effort is normal.     Breath sounds: Normal breath sounds.  Abdominal:     General: Abdomen is flat.     Palpations: Abdomen is soft.     Tenderness: There is no abdominal tenderness.  Musculoskeletal:        General: Normal range of motion.     Cervical back: Normal range of motion.  Skin:    General: Skin is warm and dry.  Neurological:     General: No focal deficit present.     Mental Status: She is alert and oriented to person, place, and time.     Sensory: No sensory deficit.     Motor: No weakness (5/5 strength in all 4  extremities).  Psychiatric:        Mood and Affect: Mood normal.     ED Results / Procedures / Treatments   Labs (all labs ordered are listed, but only abnormal results are displayed) Labs Reviewed  CBC WITH DIFFERENTIAL/PLATELET - Abnormal; Notable for the following components:      Result Value   RBC 3.83 (*)    HCT 35.9 (*)    Platelets 410 (*)    All other components within normal limits  COMPREHENSIVE METABOLIC PANEL - Abnormal; Notable for the following components:   CO2 14 (*)    Glucose, Bld 342 (*)    BUN 23 (*)    Creatinine, Ser 1.19 (*)    Albumin 3.4 (*)    Total Bilirubin 1.8 (*)    GFR, Estimated 57 (*)    Anion gap 20 (*)    All other components within normal limits  BLOOD GAS, VENOUS - Abnormal; Notable for the following components:   pCO2, Ven 33 (*)    pO2, Ven 59 (*)    Bicarbonate 16.6 (*)    Acid-base deficit 8.7 (*)    All other components within normal limits  BETA-HYDROXYBUTYRIC ACID - Abnormal; Notable for the following components:   Beta-Hydroxybutyric Acid 7.27 (*)    All other components within normal limits  URINALYSIS, ROUTINE W REFLEX MICROSCOPIC - Abnormal; Notable for the following components:   APPearance HAZY (*)    Glucose, UA >=500 (*)    Hgb urine dipstick SMALL (*)    Ketones, ur 80 (*)    Protein, ur >=300 (*)    All other components within normal limits  BASIC METABOLIC PANEL - Abnormal; Notable for the following components:   CO2 19 (*)    Glucose, Bld 119 (*)    Calcium 8.1 (*)    All other components within normal limits  BETA-HYDROXYBUTYRIC ACID - Abnormal; Notable for the following components:   Beta-Hydroxybutyric Acid 4.17 (*)    All other components within normal limits  CBG MONITORING, ED - Abnormal; Notable for the following components:   Glucose-Capillary 230 (*)    All other components within normal limits  I-STAT CG4 LACTIC ACID, ED - Abnormal; Notable for the following components:   Lactic Acid, Venous 4.2  (*)    All other components within normal limits  CBG MONITORING, ED - Abnormal; Notable for the following components:   Glucose-Capillary 369 (*)    All other components within normal limits  C DIFFICILE QUICK SCREEN W PCR REFLEX  GASTROINTESTINAL PANEL BY PCR, STOOL (REPLACES STOOL CULTURE)  MAGNESIUM  PHOSPHORUS  PREGNANCY, URINE  CBC  COMPREHENSIVE METABOLIC PANEL  MAGNESIUM  PHOSPHORUS  LACTIC ACID, PLASMA  I-STAT CG4 LACTIC ACID, ED  CBG MONITORING, ED    EKG EKG Interpretation Date/Time:  Saturday June 27 2023 17:01:11 EST Ventricular Rate:  99 PR Interval:  154 QRS Duration:  109 QT Interval:  382 QTC Calculation: 491 R Axis:   92  Text Interpretation: Sinus rhythm Borderline right axis deviation Borderline prolonged QT interval No significant change since last tracing Confirmed by Elayne Snare ([id number]) on 06/27/2023 5:03:40 PM  Radiology No results found.  Procedures Procedures    Medications Ordered in ED Medications  dextrose 50 % solution 0-50 mL (has no administration in time range)  potassium chloride 10 mEq in 100 mL IVPB (has no administration in time range)  acetaminophen (TYLENOL) tablet 650 mg (has no administration in time range)  prochlorperazine (COMPAZINE) injection 5 mg (has no administration in time range)  melatonin tablet 5 mg (has no administration in time range)  loperamide (IMODIUM) capsule 2 mg (has no administration in time range)  insulin aspart (novoLOG) injection 0-9 Units (has no administration in time range)  insulin aspart (novoLOG) injection 0-5 Units (has no administration in time range)  sodium chloride 0.9 % bolus 1,000 mL (0 mLs Intravenous Stopped 06/27/23 2107)  sodium chloride 0.9 % bolus 1,000 mL (0 mLs Intravenous Stopped 06/27/23 2107)    ED Course/ Medical Decision Making/ A&P Clinical Course as of 06/27/23 2148  Sat Jun 27, 2023  1737 AGAP 20, glucose 342 with low bicarb. VBG and BHB pending but concern  for possible DKA. Lactic added on  [VK]  1833 Lactic 4.2, 2nd bolus ordered. Has not urinated yet, BHB pending. Patient will be recommended admission for her dehydration. Will discuss with hospitalist regarding initiating insulin drip now vs waiting on BHB with only mild hyperglycemia. [VK]  1939 Dr Margo Aye, Hospitalist, recommended insulin drip now which will be started and accepted for admission. [VK]    Clinical Course User Index [VK] Rexford Maus, DO                                 Medical Decision Making This patient presents to the ED with chief complaint(s) of diarrhea, generalized weakness with pertinent past medical history of DM which further complicates the presenting complaint. The complaint involves an extensive differential diagnosis and also carries with it a high risk of complications and morbidity.    The differential diagnosis includes dehydration, electrolyte abnormality, DKA or other hyperglycemic crisis, C. difficile or other infectious diarrhea, anemia, GI bleed, no back pain or focal neurologic deficits or urinary incontinence or retention making spinal cord etiology unlikely  Additional history obtained: Additional history obtained from family Records reviewed previous admission documents and outpatient GI records  ED Course and Reassessment: On patient's arrival she was initially hypotensive and tachycardic, ill-appearing but in no acute distress.  Patient had IV access obtained and was started on IV fluids.  She will labs, Accu-Chek and EKG performed as well as stool studies and she will be closely reassessed.  Independent labs interpretation:  The following labs were independently interpreted: AGAP 20, BHB 7, hyperglycemia concerning for DKA, Lactic 4  Independent visualization of imaging: - N/A  Consultation: - Consulted or discussed management/test interpretation w/ external professional: hospitalist  Consideration for admission or further workup:  patient requires admission for dehydration and DKA in the setting of her diarrhea Social Determinants of health: N/A    Amount and/or Complexity of Data Reviewed Labs: ordered.  Risk Prescription drug management. Decision regarding hospitalization.          Final Clinical Impression(s) / ED Diagnoses Final diagnoses:  Lactic acidosis  Diarrhea, unspecified type    Rx / DC Orders ED Discharge Orders          Ordered    loperamide (IMODIUM) 2 MG capsule  As needed        06/27/23 2135              Rexford Maus, DO 06/27/23 2148

## 2023-06-28 ENCOUNTER — Observation Stay (HOSPITAL_COMMUNITY)
Admission: EM | Admit: 2023-06-28 | Discharge: 2023-06-29 | Disposition: A | Discharge status: Home / Self Care | Payer: Managed Care, Other (non HMO) | Attending: Internal Medicine | Admitting: Internal Medicine

## 2023-06-28 ENCOUNTER — Other Ambulatory Visit: Payer: Self-pay

## 2023-06-28 ENCOUNTER — Encounter (HOSPITAL_COMMUNITY): Payer: Self-pay

## 2023-06-28 DIAGNOSIS — I4581 Long QT syndrome: Secondary | ICD-10-CM | POA: Diagnosis not present

## 2023-06-28 DIAGNOSIS — R000 Tachycardia, unspecified: Secondary | ICD-10-CM | POA: Diagnosis present

## 2023-06-28 DIAGNOSIS — K219 Gastro-esophageal reflux disease without esophagitis: Secondary | ICD-10-CM

## 2023-06-28 DIAGNOSIS — R748 Abnormal levels of other serum enzymes: Secondary | ICD-10-CM | POA: Diagnosis present

## 2023-06-28 DIAGNOSIS — R809 Proteinuria, unspecified: Secondary | ICD-10-CM | POA: Insufficient documentation

## 2023-06-28 DIAGNOSIS — E872 Acidosis, unspecified: Secondary | ICD-10-CM | POA: Diagnosis present

## 2023-06-28 DIAGNOSIS — K5229 Other allergic and dietetic gastroenteritis and colitis: Secondary | ICD-10-CM | POA: Insufficient documentation

## 2023-06-28 DIAGNOSIS — F17210 Nicotine dependence, cigarettes, uncomplicated: Secondary | ICD-10-CM | POA: Insufficient documentation

## 2023-06-28 DIAGNOSIS — E1010 Type 1 diabetes mellitus with ketoacidosis without coma: Secondary | ICD-10-CM | POA: Diagnosis not present

## 2023-06-28 DIAGNOSIS — E1040 Type 1 diabetes mellitus with diabetic neuropathy, unspecified: Secondary | ICD-10-CM | POA: Diagnosis not present

## 2023-06-28 DIAGNOSIS — E860 Dehydration: Principal | ICD-10-CM | POA: Insufficient documentation

## 2023-06-28 DIAGNOSIS — E876 Hypokalemia: Secondary | ICD-10-CM | POA: Diagnosis not present

## 2023-06-28 DIAGNOSIS — E1110 Type 2 diabetes mellitus with ketoacidosis without coma: Secondary | ICD-10-CM | POA: Diagnosis present

## 2023-06-28 DIAGNOSIS — K644 Residual hemorrhoidal skin tags: Secondary | ICD-10-CM | POA: Diagnosis not present

## 2023-06-28 DIAGNOSIS — F909 Attention-deficit hyperactivity disorder, unspecified type: Secondary | ICD-10-CM | POA: Diagnosis not present

## 2023-06-28 DIAGNOSIS — R151 Fecal smearing: Secondary | ICD-10-CM | POA: Diagnosis not present

## 2023-06-28 DIAGNOSIS — N179 Acute kidney failure, unspecified: Secondary | ICD-10-CM | POA: Diagnosis not present

## 2023-06-28 DIAGNOSIS — E785 Hyperlipidemia, unspecified: Secondary | ICD-10-CM | POA: Insufficient documentation

## 2023-06-28 DIAGNOSIS — R197 Diarrhea, unspecified: Secondary | ICD-10-CM | POA: Diagnosis present

## 2023-06-28 DIAGNOSIS — R159 Full incontinence of feces: Secondary | ICD-10-CM | POA: Diagnosis not present

## 2023-06-28 DIAGNOSIS — J300 Vasomotor rhinitis: Secondary | ICD-10-CM | POA: Insufficient documentation

## 2023-06-28 DIAGNOSIS — Z860101 Personal history of adenomatous and serrated colon polyps: Secondary | ICD-10-CM

## 2023-06-28 DIAGNOSIS — E1065 Type 1 diabetes mellitus with hyperglycemia: Secondary | ICD-10-CM | POA: Diagnosis present

## 2023-06-28 DIAGNOSIS — E441 Mild protein-calorie malnutrition: Secondary | ICD-10-CM | POA: Diagnosis not present

## 2023-06-28 DIAGNOSIS — Z91018 Allergy to other foods: Secondary | ICD-10-CM | POA: Insufficient documentation

## 2023-06-28 DIAGNOSIS — D649 Anemia, unspecified: Secondary | ICD-10-CM | POA: Diagnosis not present

## 2023-06-28 DIAGNOSIS — R9431 Abnormal electrocardiogram [ECG] [EKG]: Secondary | ICD-10-CM | POA: Diagnosis present

## 2023-06-28 DIAGNOSIS — K648 Other hemorrhoids: Secondary | ICD-10-CM

## 2023-06-28 DIAGNOSIS — Z794 Long term (current) use of insulin: Secondary | ICD-10-CM | POA: Diagnosis not present

## 2023-06-28 DIAGNOSIS — D500 Iron deficiency anemia secondary to blood loss (chronic): Secondary | ICD-10-CM | POA: Diagnosis not present

## 2023-06-28 DIAGNOSIS — H1045 Other chronic allergic conjunctivitis: Secondary | ICD-10-CM | POA: Insufficient documentation

## 2023-06-28 DIAGNOSIS — E1042 Type 1 diabetes mellitus with diabetic polyneuropathy: Secondary | ICD-10-CM | POA: Diagnosis present

## 2023-06-28 LAB — COMPREHENSIVE METABOLIC PANEL
ALT: 19 U/L (ref 0–44)
AST: 20 U/L (ref 15–41)
Albumin: 3 g/dL — ABNORMAL LOW (ref 3.5–5.0)
Alkaline Phosphatase: 101 U/L (ref 38–126)
BUN: 18 mg/dL (ref 6–20)
CO2: <7 mmol/L — ABNORMAL LOW (ref 22–32)
Calcium: 8.5 mg/dL — ABNORMAL LOW (ref 8.9–10.3)
Chloride: 100 mmol/L (ref 98–111)
Creatinine, Ser: 1.3 mg/dL — ABNORMAL HIGH (ref 0.44–1.00)
GFR, Estimated: 51 mL/min — ABNORMAL LOW (ref >60)
Glucose, Bld: 617 mg/dL (ref 70–99)
Potassium: 4.6 mmol/L (ref 3.5–5.1)
Sodium: 130 mmol/L — ABNORMAL LOW (ref 135–145)
Total Bilirubin: 2.3 mg/dL — ABNORMAL HIGH (ref <1.2)
Total Protein: 6.4 g/dL — ABNORMAL LOW (ref 6.5–8.1)

## 2023-06-28 LAB — GLUCOSE, CAPILLARY
Glucose-Capillary: 140 mg/dL — ABNORMAL HIGH (ref 70–99)
Glucose-Capillary: 178 mg/dL — ABNORMAL HIGH (ref 70–99)
Glucose-Capillary: 165 mg/dL — ABNORMAL HIGH (ref 70–99)
Glucose-Capillary: 162 mg/dL — ABNORMAL HIGH (ref 70–99)
Glucose-Capillary: 115 mg/dL — ABNORMAL HIGH (ref 70–99)
Glucose-Capillary: 208 mg/dL — ABNORMAL HIGH (ref 70–99)
Glucose-Capillary: 241 mg/dL — ABNORMAL HIGH (ref 70–99)
Glucose-Capillary: 158 mg/dL — ABNORMAL HIGH (ref 70–99)
Glucose-Capillary: 123 mg/dL — ABNORMAL HIGH (ref 70–99)

## 2023-06-28 LAB — CBC WITH DIFFERENTIAL/PLATELET
Abs Immature Granulocytes: 0.05 10*3/uL (ref 0.00–0.07)
Basophils Absolute: 0.1 10*3/uL (ref 0.0–0.1)
Basophils Relative: 1 %
Eosinophils Absolute: 0.1 10*3/uL (ref 0.0–0.5)
Eosinophils Relative: 1 %
HCT: 35.4 % — ABNORMAL LOW (ref 36.0–46.0)
Hemoglobin: 11.4 g/dL — ABNORMAL LOW (ref 12.0–15.0)
Immature Granulocytes: 1 %
Lymphocytes Relative: 37 %
Lymphs Abs: 2.8 10*3/uL (ref 0.7–4.0)
MCH: 31.2 pg (ref 26.0–34.0)
MCHC: 32.2 g/dL (ref 30.0–36.0)
MCV: 97 fL (ref 80.0–100.0)
Monocytes Absolute: 0.5 10*3/uL (ref 0.1–1.0)
Monocytes Relative: 6 %
Neutro Abs: 4.1 10*3/uL (ref 1.7–7.7)
Neutrophils Relative %: 54 %
Platelets: 391 10*3/uL (ref 150–400)
RBC: 3.65 MIL/uL — ABNORMAL LOW (ref 3.87–5.11)
RDW: 14.6 % (ref 11.5–15.5)
WBC: 7.6 10*3/uL (ref 4.0–10.5)
nRBC: 0 % (ref 0.0–0.2)

## 2023-06-28 LAB — BASIC METABOLIC PANEL
Anion gap: 13 (ref 5–15)
Anion gap: 12 (ref 5–15)
Anion gap: 14 (ref 5–15)
BUN: 13 mg/dL (ref 6–20)
BUN: 11 mg/dL (ref 6–20)
BUN: 11 mg/dL (ref 6–20)
CO2: 11 mmol/L — ABNORMAL LOW (ref 22–32)
CO2: 14 mmol/L — ABNORMAL LOW (ref 22–32)
CO2: 13 mmol/L — ABNORMAL LOW (ref 22–32)
Calcium: 7.7 mg/dL — ABNORMAL LOW (ref 8.9–10.3)
Calcium: 7.8 mg/dL — ABNORMAL LOW (ref 8.9–10.3)
Calcium: 7.8 mg/dL — ABNORMAL LOW (ref 8.9–10.3)
Chloride: 112 mmol/L — ABNORMAL HIGH (ref 98–111)
Chloride: 110 mmol/L (ref 98–111)
Chloride: 109 mmol/L (ref 98–111)
Creatinine, Ser: 0.86 mg/dL (ref 0.44–1.00)
Creatinine, Ser: 0.77 mg/dL (ref 0.44–1.00)
Creatinine, Ser: 0.9 mg/dL (ref 0.44–1.00)
GFR, Estimated: >60 mL/min (ref >60)
GFR, Estimated: >60 mL/min (ref >60)
GFR, Estimated: >60 mL/min (ref >60)
Glucose, Bld: 151 mg/dL — ABNORMAL HIGH (ref 70–99)
Glucose, Bld: 139 mg/dL — ABNORMAL HIGH (ref 70–99)
Glucose, Bld: 269 mg/dL — ABNORMAL HIGH (ref 70–99)
Potassium: 4.3 mmol/L (ref 3.5–5.1)
Potassium: 3.7 mmol/L (ref 3.5–5.1)
Potassium: 3.5 mmol/L (ref 3.5–5.1)
Sodium: 136 mmol/L (ref 135–145)
Sodium: 136 mmol/L (ref 135–145)
Sodium: 136 mmol/L (ref 135–145)

## 2023-06-28 LAB — CBG MONITORING, ED
Glucose-Capillary: 577 mg/dL (ref 70–99)
Glucose-Capillary: 227 mg/dL — ABNORMAL HIGH (ref 70–99)
Glucose-Capillary: 159 mg/dL — ABNORMAL HIGH (ref 70–99)

## 2023-06-28 LAB — BLOOD GAS, VENOUS
Acid-base deficit: 21.7 mmol/L — ABNORMAL HIGH (ref 0.0–2.0)
Bicarbonate: 5.3 mmol/L — ABNORMAL LOW (ref 20.0–28.0)
O2 Saturation: 85.9 %
Patient temperature: 37
pCO2, Ven: <18 mm[Hg] — CL (ref 44–60)
pH, Ven: 7.13 — CL (ref 7.25–7.43)
pO2, Ven: 57 mm[Hg] — ABNORMAL HIGH (ref 32–45)

## 2023-06-28 LAB — PHOSPHORUS: Phosphorus: 1.9 mg/dL — ABNORMAL LOW (ref 2.5–4.6)

## 2023-06-28 LAB — MAGNESIUM: Magnesium: 2.1 mg/dL (ref 1.7–2.4)

## 2023-06-28 LAB — BETA-HYDROXYBUTYRIC ACID
Beta-Hydroxybutyric Acid: >8 mmol/L — ABNORMAL HIGH (ref 0.05–0.27)
Beta-Hydroxybutyric Acid: 4.48 mmol/L — ABNORMAL HIGH (ref 0.05–0.27)

## 2023-06-28 LAB — HCG, SERUM, QUALITATIVE: Preg, Serum: NEGATIVE

## 2023-06-28 LAB — LACTIC ACID, PLASMA
Lactic Acid, Venous: 1.1 mmol/L (ref 0.5–1.9)
Lactic Acid, Venous: 4.4 mmol/L (ref 0.5–1.9)
Lactic Acid, Venous: 1.9 mmol/L (ref 0.5–1.9)

## 2023-06-28 LAB — LIPASE, BLOOD: Lipase: 78 U/L — ABNORMAL HIGH (ref 11–51)

## 2023-06-28 MED ORDER — GLYCOPYRROLATE 1 MG PO TABS
2.0000 mg | ORAL_TABLET | Freq: Two times a day (BID) | ORAL | Status: DC
Start: 2023-06-28 — End: 2023-06-29
  Administered 2023-06-28 – 2023-06-29 (×2): 2 mg via ORAL
  Filled 2023-06-28 (×2): qty 2

## 2023-06-28 MED ORDER — ENOXAPARIN SODIUM 40 MG/0.4ML IJ SOSY: 40.0000 mg | PREFILLED_SYRINGE | INTRAMUSCULAR | Status: DC

## 2023-06-28 MED ORDER — MAGNESIUM SULFATE 2 GM/50ML IV SOLN
2.0000 g | Freq: Once | INTRAVENOUS | Status: AC
  Administered 2023-06-28: 2 g via INTRAVENOUS
  Filled 2023-06-28: qty 50

## 2023-06-28 MED ORDER — DEXTROSE 50 % IV SOLN: 0.0000 mL | INTRAVENOUS | Status: DC | PRN

## 2023-06-28 MED ORDER — PSYLLIUM 95 % PO PACK
1.0000 | PACK | Freq: Every day | ORAL | Status: DC
  Administered 2023-06-28 – 2023-06-29 (×2): 1 via ORAL
  Filled 2023-06-28 (×2): qty 1

## 2023-06-28 MED ORDER — INSULIN REGULAR(HUMAN) IN NACL 100-0.9 UT/100ML-% IV SOLN
INTRAVENOUS | Status: DC
  Administered 2023-06-28: 6.5 [IU]/h via INTRAVENOUS
  Administered 2023-06-28: 0.8 [IU]/h via INTRAVENOUS
  Filled 2023-06-28 (×2): qty 100

## 2023-06-28 MED ORDER — LACTATED RINGERS IV BOLUS
1000.0000 mL | Freq: Once | INTRAVENOUS | Status: AC
  Administered 2023-06-28: 1000 mL via INTRAVENOUS

## 2023-06-28 MED ORDER — POTASSIUM CHLORIDE 10 MEQ/100ML IV SOLN
10.0000 meq | INTRAVENOUS | Status: AC
  Administered 2023-06-28 (×2): 10 meq via INTRAVENOUS
  Filled 2023-06-28 (×2): qty 100

## 2023-06-28 MED ORDER — ACETAMINOPHEN 650 MG RE SUPP: 650.0000 mg | Freq: Four times a day (QID) | RECTAL | Status: DC | PRN

## 2023-06-28 MED ORDER — LACTATED RINGERS IV BOLUS
20.0000 mL/kg | Freq: Once | INTRAVENOUS | Status: AC
  Administered 2023-06-28: 1242 mL via INTRAVENOUS

## 2023-06-28 MED ORDER — K PHOS MONO-SOD PHOS DI & MONO 155-852-130 MG PO TABS
250.0000 mg | ORAL_TABLET | Freq: Four times a day (QID) | ORAL | Status: DC
  Administered 2023-06-28: 250 mg via ORAL
  Filled 2023-06-28 (×3): qty 1

## 2023-06-28 MED ORDER — PANTOPRAZOLE SODIUM 40 MG IV SOLR
40.0000 mg | Freq: Once | INTRAVENOUS | Status: AC
  Administered 2023-06-28: 40 mg via INTRAVENOUS
  Filled 2023-06-28: qty 10

## 2023-06-28 MED ORDER — SODIUM CHLORIDE 0.9 % IV BOLUS
1000.0000 mL | Freq: Once | INTRAVENOUS | Status: AC
  Administered 2023-06-28: 1000 mL via INTRAVENOUS

## 2023-06-28 MED ORDER — CHLORHEXIDINE GLUCONATE CLOTH 2 % EX PADS
6.0000 | MEDICATED_PAD | Freq: Every day | CUTANEOUS | Status: DC
Start: 2023-06-28 — End: 2023-06-29
  Administered 2023-06-28: 6 via TOPICAL

## 2023-06-28 MED ORDER — DEXTROSE IN LACTATED RINGERS 5 % IV SOLN: INTRAVENOUS | Status: AC

## 2023-06-28 MED ORDER — ONDANSETRON HCL 4 MG/2ML IJ SOLN
4.0000 mg | Freq: Once | INTRAMUSCULAR | Status: AC
  Administered 2023-06-28: 4 mg via INTRAVENOUS
  Filled 2023-06-28: qty 2

## 2023-06-28 MED ORDER — LACTATED RINGERS IV SOLN: INTRAVENOUS | Status: AC

## 2023-06-28 MED ORDER — ORAL CARE MOUTH RINSE: 15.0000 mL | OROMUCOSAL | Status: DC | PRN

## 2023-06-28 MED ORDER — K PHOS MONO-SOD PHOS DI & MONO 155-852-130 MG PO TABS
250.0000 mg | ORAL_TABLET | Freq: Four times a day (QID) | ORAL | Status: DC
  Filled 2023-06-28 (×2): qty 1

## 2023-06-28 MED ORDER — PROCHLORPERAZINE EDISYLATE 10 MG/2ML IJ SOLN: 10.0000 mg | Freq: Four times a day (QID) | INTRAMUSCULAR | Status: DC | PRN

## 2023-06-28 MED ORDER — ACETAMINOPHEN 325 MG PO TABS: 650.0000 mg | ORAL_TABLET | Freq: Four times a day (QID) | ORAL | Status: DC | PRN

## 2023-06-28 MED ORDER — LABETALOL HCL 5 MG/ML IV SOLN
10.0000 mg | Freq: Once | INTRAVENOUS | Status: AC
  Administered 2023-06-28: 10 mg via INTRAVENOUS
  Filled 2023-06-28: qty 4

## 2023-06-28 MED ORDER — LACTATED RINGERS IV BOLUS
500.0000 mL | Freq: Once | INTRAVENOUS | Status: AC
  Administered 2023-06-28: 500 mL via INTRAVENOUS

## 2023-06-28 NOTE — ED Notes (Signed)
Blue, light green, dark green, lavender and red tubes sent to lab

## 2023-06-28 NOTE — Inpatient Diabetes Management (Signed)
Inpatient Diabetes Program Recommendations  AACE/ADA: New Consensus Statement on Inpatient Glycemic Control (2015)  Target Ranges:  Prepandial:   less than 140 mg/dL      Peak postprandial:   less than 180 mg/dL (1-2 hours)      Critically ill patients:  140 - 180 mg/dL   Lab Results  Component Value Date   GLUCAP 140 (H) 06/28/2023   HGBA1C 11.4 (H) 01/15/2023    Review of Glycemic Control  Diabetes history:  DM1(does not make insulin.  Needs correction, basal and meal coverage) Outpatient Diabetes medications: Tresiba 10-15 at bedtime, Lyumjev 10-12 units TID Current orders for Inpatient glycemic control: IV insulin per EndoTool for DKA  HgbA1C - 11.4% Spoke with Diabetes Coordinator on 03/07/2023  Inpatient Diabetes Program Recommendations:    When criteria met for discontinuation of drip, give basal insulin 1-2 hours before drip is d/ced.  Semglee 10 units Q24H Novolog 0-9 TID and 0-5 HS Novolog 4 units TID with meals if eating > 50%  Will speak with pt in am regarding her diabetes and HgbA1C of 11.4%  Continue to follow.  Thank you. Ailene Ards, RD, LDN, CDCES Inpatient Diabetes Coordinator [phone]

## 2023-06-28 NOTE — ED Triage Notes (Signed)
Patient was seen here yesterday. Reports diarrhea, nausea, fatigue, and body aches x a couple days. Patient's BGL on arrival was 577. Patient is Type 1 DM.

## 2023-06-28 NOTE — ED Notes (Addendum)
Patient has limited access she refused to let nurse start second IV , staff did explain the importance of her receiving her insulin due to pt. Condition, MD aware

## 2023-06-28 NOTE — H&P (Signed)
History and Physical    Patient: Connie Vaughn UJW:[id number] DOB: 05-08-1976 DOA: 06/28/2023 DOS: the patient was seen and examined on 06/28/2023 PCP: Jackelyn Poling, DO  Patient coming from: Home  Chief Complaint:  Chief Complaint  Patient presents with   Hyperglycemia   Diarrhea   HPI: Connie Vaughn is a 47 y.o. female with medical history significant of ADHD, AKI, unspecified anemia, history of blood transfusion, constipation, GERD, hyperlipidemia, history of fecal incontinence, type 1 diabetes, history of DKA who presented yesterday to the emergency department with complaints of fatigue, nausea, multiple episodes of diarrhea associated with abdominal pain.  She was admitted by our night shift team, but did not stay because she had to be work today at 1400.  She went home note with instructions to follow-up with [facility].  However, the patient stated that yesterday she did not use insulin at home and the only insulin she received was in the emergency department.  She has also had 3 additional episodes of diarrhea.  She had an episode of emesis this morning.  She has been feeling thirsty no fever, chills or night sweats. No sore throat, rhinorrhea, dyspnea, wheezing or hemoptysis.  No chest pain, palpitations, diaphoresis, PND, orthopnea or pitting edema of the lower extremities.  No constipation, melena or hematochezia.  No flank pain, dysuria, frequency or hematuria.  The patient stated that she had a good amount of urinary output after IV fluids in the ED.   Lab work: Serum pregnancy test is negative.  Normal lactic acid.  CBC showed a white count 7.6, hemoglobin 11.4 g/dL platelets 562.  Lipase was 78 units/L.  Venous blood gas showed a pH of 7.13, pCO2 less than 18 and pO2 of 57 mmHg.  Bicarbonate was 5.3 and acid-base deficit of 21.7 mmol/L.  O2 saturation 86%.  Beta hydroxybutyric acid was greater than 8.00 mmol/L.  CMP showed a sodium 130, potassium 4.6, chloride 100 and CO2 less  than 7 mmol/L with a known calculated anion gap.  Total bilirubin 2.3, glucose is 117, BUN 18 and creatinine 1.30 mg/deciliter.  Calcium normalizes after correction to albumin level.  Total protein 6.4 and albumin 3.0 g/dL.  AST, ALT and alkaline phosphatase were normal.   ED course: Initial vital signs were temperature 97.6 F, pulse 916, respiration 20, BP 107/57 mmHg and O2 sat 100% on room air.  The patient received LR 1242 mL bolus, normal saline 1000 mL liter bolus, ondansetron 4 mg IVP, potassium chloride 10 mEq IVPB x 2 doses and was started on an insulin infusion with maintenance fluids.  Review of Systems: As mentioned in the history of present illness. All other systems reviewed and are negative. Past Medical History:  Diagnosis Date   ADHD (attention deficit hyperactivity disorder)    AKI (acute kidney injury) (HCC) 10/06/2020   Anemia    Blood transfusion without reported diagnosis    Constipation    Diabetes mellitus type 1, uncontrolled    DKA (diabetic ketoacidosis) (HCC) 10/06/2020   GERD (gastroesophageal reflux disease)    Hyperlipidemia    Incontinence, feces    Past Surgical History:  Procedure Laterality Date   I & D EXTREMITY Right 03/09/2021   Procedure: IRRIGATION AND DEBRIDEMENT RIGHT RING FINGER;  Surgeon: Betha Loa, MD;  Location: [facility] OR;  Service: Orthopedics;  Laterality: Right;   TUBAL LIGATION     Social History:  reports that she has been smoking cigarettes. She has a 0.5 pack-year smoking history. She has  never used smokeless tobacco. She reports current drug use. She reports that she does not drink alcohol.  Allergies  Allergen Reactions   Sulfa Antibiotics Hives    Family History  Problem Relation Age of Onset   Heart disease Mother    Congestive Heart Failure Mother    Congestive Heart Failure Sister    Diabetes Maternal Grandmother    Colon polyps Neg Hx    Crohn's disease Neg Hx    Esophageal cancer Neg Hx    Rectal cancer Neg Hx     Stomach cancer Neg Hx     Prior to Admission medications   Medication Sig Start Date End Date Taking? Authorizing Provider  Continuous Glucose Sensor (FREESTYLE LIBRE 3 SENSOR) MISC 1 each every 14 (fourteen) days.    [provider]  glucose blood test strip Use as instructed Patient taking differently: 1 each by Other route in the morning, at noon, and at bedtime. Use as instructed 11/18/17   [name], NP  insulin degludec (TRESIBA FLEXTOUCH) 100 UNIT/ML FlexTouch Pen Inject 18 Units into the skin at bedtime. 08/22/19   [provider]  Insulin Pen Needle (B-D UF III MINI PEN NEEDLES) 31G X 5 MM MISC Use as instructed Patient taking differently: 1 each by Other route in the morning, at noon, and at bedtime. Use as instructed 11/18/17   [name], NP  Lancets MISC 1 each by Does not apply route in the morning, at noon, and at bedtime. 07/09/13   [provider]  loperamide (IMODIUM) 2 MG capsule Take 1 capsule (2 mg total) by mouth as needed for diarrhea or loose stools. 06/27/23   [name], DO  LYUMJEV KWIKPEN 100 UNIT/ML KwikPen Inject 10-12 Units into the skin 3 (three) times daily with meals. 04/03/21   [provider]    Physical Exam: Vitals:   06/28/23 0733 06/28/23 0734  BP: (!) 107/57   Pulse: (!) 116   Resp: 20   Temp: 97.6 F (36.4 C)   TempSrc: Oral   SpO2: 100%   Weight:  62.1 kg  Height:  5\' 4"  (1.626 m)   Physical Exam Vitals and nursing note reviewed.  Constitutional:      General: She is awake. She is not in acute distress.    Appearance: She is ill-appearing.  HENT:     Head: Normocephalic.     Nose: No rhinorrhea.     Mouth/Throat:     Mouth: Mucous membranes are dry.  Eyes:     General: No scleral icterus.    Pupils: Pupils are equal, round, and reactive to light.  Neck:     Vascular: No JVD.  Cardiovascular:     Rate and Rhythm: Normal rate and regular rhythm.     Heart sounds: S1 normal and S2  normal.  Pulmonary:     Effort: Pulmonary effort is normal.     Breath sounds: Normal breath sounds.  Abdominal:     General: Bowel sounds are normal. There is no distension.     Palpations: Abdomen is soft.     Tenderness: There is no abdominal tenderness. There is no guarding.  Musculoskeletal:     Cervical back: Neck supple.     Right lower leg: No edema.     Left lower leg: No edema.  Skin:    General: Skin is warm and dry.  Neurological:     General: No focal deficit present.     Mental  Status: She is alert and oriented to person, place, and time.  Psychiatric:        Mood and Affect: Mood normal.        Behavior: Behavior normal. Behavior is cooperative.     Data Reviewed:  Results are pending, will review when available.  EKG: Vent. rate 113 BPM PR interval 148 ms QRS duration 113 ms QT/QTcB 379/518 ms P-R-T axes 69 86 36 Sinus tachycardia Borderline intraventricular conduction delay Prolonged QT interval  Assessment and Plan: Principal Problem:   Type 1 diabetes mellitus with hyperglycemia (HCC) Presenting with:   DKA (diabetic ketoacidosis) (HCC) Observation/stepdown. Keep NPO. Continue IV fluids. Continue insulin infusion. Monitor CBG closely. BMP every 4 hours. BHA every 8 hours. Replace electrolytes as needed. Consult diabetes coordinator. Transition to SQ insulin per Endo tool.    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 Analgesics as needed.  Active Problems:   Lactic acidosis  No fevers. Suspect hypoperfusion from dehydration. Continue IV fluids.    AKI (acute kidney injury) (HCC) Continue IV rehydration. Avoid hypotension. Avoid nephrotoxins. Monitor intake and output. Monitor renal function/electrolytes.    Tachycardia Secondary to volume depletion. Improving with IV rehydration.    Intractable diarrhea GI will evaluate as an inpatient.    Prolonged  QT interval Avoid QT prolonging meds as possible. KCl 10 mEq IVPB x 2 given earlier. Magnesium sulfate 2 g IVPB now. Keep electrolytes optimized. Check EKG in the morning.    Hypophosphatemia Replacing orally. Follow level as needed.    Elevated lipase No significant abdominal pain at the moment. Pancreatic imaging was unremarkable in September. Follow level as needed.    Mild protein malnutrition (HCC) In the setting of anemia and chronic diarrhea. May benefit from protein supplementation. Consider nutritional services evaluation. Follow-up albumin level.    Normocytic anemia Monitor hematocrit and hemoglobin. Transfuse as needed.    Hyperlipidemia Currently not on therapy. Follow-up with primary care provider.    Advance Care Planning:   Code Status: Full Code   Consults: [facility] Tiajuana Amass, MD).  Family Communication:   Severity of Illness: The appropriate patient status for this patient is OBSERVATION. Observation status is judged to be reasonable and necessary in order to provide the required intensity of service to ensure the patient's safety. The patient's presenting symptoms, physical exam findings, and initial radiographic and laboratory data in the context of their medical condition is felt to place them at decreased risk for further clinical deterioration. Furthermore, it is anticipated that the patient will be medically stable for discharge from the hospital within 2 midnights of admission.   Author: Bobette Mo, MD 06/28/2023 10:07 AM  For on call review [email or web address].   This document was prepared using Dragon voice recognition software and may contain some unintended transcription errors.

## 2023-06-28 NOTE — ED Notes (Signed)
ED TO INPATIENT HANDOFF REPORT  ED Nurse Name and Phone #: Tia, RN  S Name/Age/Gender Connie Vaughn 47 y.o. female Room/Bed: WA19/WA19  Code Status   Code Status: Full Code  Home/SNF/Other Home Patient oriented to: self, place, time, and situation Is this baseline? Yes   Triage Complete: Triage complete  Chief Complaint DKA (diabetic ketoacidosis) (HCC) [E11.10]  Triage Note Patient was seen here yesterday. Reports diarrhea, nausea, fatigue, and body aches x a couple days. Patient's BGL on arrival was 577. Patient is Type 1 DM.   Allergies Allergies  Allergen Reactions   Sulfa Antibiotics Hives    Level of Care/Admitting Diagnosis ED Disposition     ED Disposition  Admit   Condition  --   Comment  Hospital Area: [facility] [[id number]]  Level of Care: Stepdown [14]  Admit to SDU based on following criteria: Severe physiological/psychological symptoms:  Any diagnosis requiring assessment & intervention at least every 4 hours on an ongoing basis to obtain desired patient outcomes including stability and rehabilitation  May place patient in observation at [facility] or [facility] if equivalent level of care is available:: No  Covid Evaluation: Asymptomatic - no recent exposure (last 10 days) testing not required  Diagnosis: DKA (diabetic ketoacidosis) [facility]) [[id number]]  Admitting Physician: Bobette Mo [[id number]]  Attending Physician: Bobette Mo [[id number]]          B Medical/Surgery History Past Medical History:  Diagnosis Date   ADHD (attention deficit hyperactivity disorder)    AKI (acute kidney injury) (HCC) 10/06/2020   Anemia    Blood transfusion without reported diagnosis    Constipation    Diabetes mellitus type 1, uncontrolled    DKA (diabetic ketoacidosis) (HCC) 10/06/2020   GERD (gastroesophageal reflux disease)    Hyperlipidemia    Incontinence, feces    Past Surgical History:  Procedure Laterality Date   I  & D EXTREMITY Right 03/09/2021   Procedure: IRRIGATION AND DEBRIDEMENT RIGHT RING FINGER;  Surgeon: Betha Loa, MD;  Location: [facility] OR;  Service: Orthopedics;  Laterality: Right;   TUBAL LIGATION       A IV Location/Drains/Wounds Patient Lines/Drains/Airways Status     Active Line/Drains/Airways     Name Placement date Placement time Site Days   Peripheral IV 06/28/23 20 G 1" Right Antecubital 06/28/23  0749  Antecubital  less than 1   Wound / Incision (Open or Dehisced) 03/06/23 Diabetic ulcer Toe (Comment  which one) Left;Posterior;Medial 03/06/23  2300  Toe (Comment  which one)  114            Intake/Output Last 24 hours  Intake/Output Summary (Last 24 hours) at 06/28/2023 1031 Last data filed at 06/28/2023 5784 Gross per 24 hour  Intake 1348.65 ml  Output --  Net 1348.65 ml    Labs/Imaging Results for orders placed or performed during the hospital encounter of 06/28/23 (from the past 48 hours)  CBG monitoring, ED     Status: Abnormal   Collection Time: 06/28/23  7:32 AM  Result Value Ref Range   Glucose-Capillary 577 (HH) 70 - 99 mg/dL    Comment: Glucose reference range applies only to samples taken after fasting for at least 8 hours.   Comment 1 Notify RN   CBC with Differential     Status: Abnormal   Collection Time: 06/28/23  7:54 AM  Result Value Ref Range   WBC 7.6 4.0 - 10.5 K/uL   RBC 3.65 (L) 3.87 -  5.11 MIL/uL   Hemoglobin 11.4 (L) 12.0 - 15.0 g/dL   HCT 40.9 (L) 81.1 - 91.4 %   MCV 97.0 80.0 - 100.0 fL   MCH 31.2 26.0 - 34.0 pg   MCHC 32.2 30.0 - 36.0 g/dL   RDW 78.2 95.6 - 21.3 %   Platelets 391 150 - 400 K/uL   nRBC 0.0 0.0 - 0.2 %   Neutrophils Relative % 54 %   Neutro Abs 4.1 1.7 - 7.7 K/uL   Lymphocytes Relative 37 %   Lymphs Abs 2.8 0.7 - 4.0 K/uL   Monocytes Relative 6 %   Monocytes Absolute 0.5 0.1 - 1.0 K/uL   Eosinophils Relative 1 %   Eosinophils Absolute 0.1 0.0 - 0.5 K/uL   Basophils Relative 1 %   Basophils Absolute 0.1 0.0 -  0.1 K/uL   Immature Granulocytes 1 %   Abs Immature Granulocytes 0.05 0.00 - 0.07 K/uL    Comment: Performed at [facility], [address], [place], [place] [address]  Comprehensive metabolic panel     Status: Abnormal   Collection Time: 06/28/23  7:54 AM  Result Value Ref Range   Sodium 130 (L) 135 - 145 mmol/L    Comment: DELTA CHECK NOTED   Potassium 4.6 3.5 - 5.1 mmol/L   Chloride 100 98 - 111 mmol/L   CO2 <7 (L) 22 - 32 mmol/L   Glucose, Bld 617 (HH) 70 - 99 mg/dL    Comment: CRITICAL RESULT CALLED TO, READ BACK BY AND VERIFIED WITH GRAY,S. RN AT 8469 06/28/23 MULLINS,T Glucose reference range applies only to samples taken after fasting for at least 8 hours.    BUN 18 6 - 20 mg/dL   Creatinine, Ser 6.29 (H) 0.44 - 1.00 mg/dL   Calcium 8.5 (L) 8.9 - 10.3 mg/dL   Total Protein 6.4 (L) 6.5 - 8.1 g/dL   Albumin 3.0 (L) 3.5 - 5.0 g/dL   AST 20 15 - 41 U/L   ALT 19 0 - 44 U/L   Alkaline Phosphatase 101 38 - 126 U/L   Total Bilirubin 2.3 (H) <1.2 mg/dL   GFR, Estimated 51 (L) >60 mL/min    Comment: (NOTE) Calculated using the CKD-EPI Creatinine Equation (2021)    Anion gap NOT CALCULATED 5 - 15    Comment: Performed at [facility], [address] [place], [place] [address]  Beta-hydroxybutyric acid     Status: Abnormal   Collection Time: 06/28/23  7:54 AM  Result Value Ref Range   Beta-Hydroxybutyric Acid >8.00 (H) 0.05 - 0.27 mmol/L    Comment: RESULT CONFIRMED BY MANUAL DILUTION Performed at [facility], [address] [place], [place] [address]   Lipase, blood     Status: Abnormal   Collection Time: 06/28/23  7:54 AM  Result Value Ref Range   Lipase 78 (H) 11 - 51 U/L    Comment: Performed at [facility], [address] [place], [place] [address]  Blood gas, venous (at [facility])     Status: Abnormal   Collection Time: 06/28/23  8:27 AM  Result Value Ref Range   pH, Ven 7.13 (LL) 7.25 -  7.43    Comment: CRITICAL RESULT CALLED TO, READ BACK BY AND VERIFIED WITH: GRAY,S. RN AT 5366 06/28/23 MULLINS,T    pCO2, Ven <18 (LL) 44 - 60 mmHg    Comment: CRITICAL RESULT CALLED TO, READ BACK BY AND VERIFIED WITH: GRAY,S.  RN AT 0850 06/28/23 MULLINS,T    pO2, Ven 57 (H) 32 - 45 mmHg   Bicarbonate 5.3 (L) 20.0 - 28.0 mmol/L   Acid-base deficit 21.7 (H) 0.0 - 2.0 mmol/L   O2 Saturation 85.9 %   Patient temperature 37.0     Comment: Performed at [facility], [address] [place], [place] [address]  Lactic acid, plasma     Status: None   Collection Time: 06/28/23  8:27 AM  Result Value Ref Range   Lactic Acid, Venous 1.1 0.5 - 1.9 mmol/L    Comment: Performed at [facility], [address] [place], [place] [address]  hCG, serum, qualitative     Status: None   Collection Time: 06/28/23  8:55 AM  Result Value Ref Range   Preg, Serum NEGATIVE NEGATIVE    Comment:        THE SENSITIVITY OF THIS METHODOLOGY IS >10 mIU/mL. Performed at [facility], [address], [place], [place] [address]   CBG monitoring, ED     Status: Abnormal   Collection Time: 06/28/23 10:04 AM  Result Value Ref Range   Glucose-Capillary 227 (H) 70 - 99 mg/dL    Comment: Glucose reference range applies only to samples taken after fasting for at least 8 hours.   No results found.  Pending Labs Unresulted Labs (From admission, onward)     Start     Ordered   06/29/23 0500  CBC  Tomorrow morning,   R        06/28/23 1017   06/29/23 0500  Comprehensive metabolic panel  Tomorrow morning,   R        06/28/23 1017   06/28/23 1600  Beta-hydroxybutyric acid  (Diabetes Ketoacidosis (DKA))  Once-Timed,   TIMED        06/28/23 1010   06/28/23 1200  Basic metabolic panel  (Diabetes Ketoacidosis (DKA))  STAT Now then every 4 hours ,   STAT      06/28/23 1014   06/28/23 1200  Magnesium  Once-Timed,   TIMED        06/28/23 1014   06/28/23 1200   Phosphorus  Once-Timed,   TIMED        06/28/23 1014   06/28/23 0812  Urinalysis, w/ Reflex to Culture (Infection Suspected) -Urine, Clean Catch  Once,   URGENT       Question:  Specimen Source  Answer:  Urine, Clean Catch   06/28/23 0812   06/28/23 0811  Lactic acid, plasma  (Lactic Acid)  Now then every 2 hours,   R (with STAT occurrences)      06/28/23 0812            Vitals/Pain Today's Vitals   06/28/23 0733 06/28/23 0734  BP: (!) 107/57   Pulse: (!) 116   Resp: 20   Temp: 97.6 F (36.4 C)   TempSrc: Oral   SpO2: 100%   Weight:  62.1 kg  Height:  5\' 4"  (1.626 m)  PainSc: 5      Isolation Precautions No active isolations  Medications Medications  insulin regular, human (MYXREDLIN) 100 units/ 100 mL infusion (6.5 Units/hr Intravenous New Bag/Given 06/28/23 0942)  lactated ringers infusion ( Intravenous New Bag/Given 06/28/23 1019)  dextrose 5 % in lactated ringers infusion (has no administration in time range)  dextrose 50 % solution 0-50 mL (has no administration in time range)  potassium chloride 10 mEq in 100 mL IVPB (10  mEq Intravenous New Bag/Given 06/28/23 1019)  prochlorperazine (COMPAZINE) injection 10 mg (has no administration in time range)  enoxaparin (LOVENOX) injection 40 mg (has no administration in time range)  acetaminophen (TYLENOL) tablet 650 mg (has no administration in time range)    Or  acetaminophen (TYLENOL) suppository 650 mg (has no administration in time range)  pantoprazole (PROTONIX) injection 40 mg (has no administration in time range)  sodium chloride 0.9 % bolus 1,000 mL (0 mLs Intravenous Stopped 06/28/23 0906)  ondansetron (ZOFRAN) injection 4 mg (4 mg Intravenous Given 06/28/23 0815)  lactated ringers bolus 1,242 mL (0 mLs Intravenous Stopped 06/28/23 0936)    Mobility walks     Focused Assessments Neuro Assessment Handoff:  Swallow screen pass?  N/A         Neuro Assessment:   Neuro Checks:      Has TPA been given?  No If patient is a Neuro Trauma and patient is going to OR before floor call report to 4N Charge nurse: [phone] or [phone]   R Recommendations: See Admitting Provider Note  Report given to:   Additional Notes:

## 2023-06-28 NOTE — ED Notes (Signed)
CBG 577, RN notified

## 2023-06-28 NOTE — ED Notes (Signed)
RN called pharmacy and was told NS, potassium and insulin could run in same IV. RN changed out fluids, care ongoing

## 2023-06-28 NOTE — ED Notes (Signed)
Called lab added on qualitative lab

## 2023-06-28 NOTE — ED Provider Notes (Signed)
[place] EMERGENCY DEPARTMENT AT [facility] Provider Note   CSN: [id number] Arrival date & time: 06/28/23  6237     History  Chief Complaint  Patient presents with   Hyperglycemia   Diarrhea    Connie Vaughn is a 47 y.o. female.  The history is provided by the patient and medical records. No language interpreter was used.  Hyperglycemia Blood sugar level PTA:  Over 500 Severity:  Moderate Onset quality:  Gradual Duration:  2 days Timing:  Constant Progression:  Waxing and waning Chronicity:  Recurrent Diabetes status:  Controlled with insulin Relieved by:  Nothing Ineffective treatments:  None tried Associated symptoms: dehydration, fatigue, increased thirst, nausea and vomiting   Associated symptoms: no abdominal pain, no chest pain, no dysuria, no fever and no shortness of breath   Risk factors: hx of DKA   Diarrhea Quality:  Watery Onset quality:  Gradual Duration:  3 months Timing:  Constant Progression:  Unchanged Relieved by:  Nothing Worsened by:  Nothing Ineffective treatments:  None tried Associated symptoms: vomiting   Associated symptoms: no abdominal pain, no chills, no fever and no headaches        Home Medications Prior to Admission medications   Medication Sig Start Date End Date Taking? Authorizing Provider  Continuous Glucose Sensor (FREESTYLE LIBRE 3 SENSOR) MISC 1 each every 14 (fourteen) days.    [provider]  glucose blood test strip Use as instructed Patient taking differently: 1 each by Other route in the morning, at noon, and at bedtime. Use as instructed 11/18/17   [name], NP  insulin degludec (TRESIBA FLEXTOUCH) 100 UNIT/ML FlexTouch Pen Inject 18 Units into the skin at bedtime. 08/22/19   [provider]  Insulin Pen Needle (B-D UF III MINI PEN NEEDLES) 31G X 5 MM MISC Use as instructed Patient taking differently: 1 each by Other route in the morning, at noon, and at bedtime. Use as  instructed 11/18/17   [name], NP  Lancets MISC 1 each by Does not apply route in the morning, at noon, and at bedtime. 07/09/13   [provider]  loperamide (IMODIUM) 2 MG capsule Take 1 capsule (2 mg total) by mouth as needed for diarrhea or loose stools. 06/27/23   [name], DO  LYUMJEV KWIKPEN 100 UNIT/ML KwikPen Inject 10-12 Units into the skin 3 (three) times daily with meals. 04/03/21   [provider]      Allergies    Sulfa antibiotics    Review of Systems   Review of Systems  Constitutional:  Positive for fatigue. Negative for chills and fever.  HENT:  Negative for congestion.   Respiratory:  Negative for cough, chest tightness, shortness of breath and wheezing.   Cardiovascular:  Negative for chest pain.  Gastrointestinal:  Positive for diarrhea, nausea and vomiting. Negative for abdominal pain and constipation.  Endocrine: Positive for polydipsia.  Genitourinary:  Negative for dysuria and flank pain.  Musculoskeletal:  Negative for back pain.  Skin:  Negative for rash.  Neurological:  Negative for headaches.  Psychiatric/Behavioral:  Negative for agitation.   All other systems reviewed and are negative.   Physical Exam Updated Vital Signs BP (!) 107/57 (BP Location: Left Arm)   Pulse (!) 116   Temp 97.6 F (36.4 C) (Oral)   Resp 20   Ht 5\' 4"  (1.626 m)   Wt 62.1 kg   SpO2 100%   BMI 23.52 kg/m  Physical Exam Vitals  and nursing note reviewed.  Constitutional:      General: She is not in acute distress.    Appearance: She is well-developed. She is not ill-appearing, toxic-appearing or diaphoretic.  HENT:     Head: Normocephalic and atraumatic.     Nose: No congestion or rhinorrhea.     Mouth/Throat:     Mouth: Mucous membranes are dry.     Pharynx: No oropharyngeal exudate or posterior oropharyngeal erythema.  Eyes:     Extraocular Movements: Extraocular movements intact.     Conjunctiva/sclera: Conjunctivae normal.      Pupils: Pupils are equal, round, and reactive to light.  Cardiovascular:     Rate and Rhythm: Regular rhythm. Tachycardia present.     Heart sounds: No murmur heard. Pulmonary:     Effort: Pulmonary effort is normal. No respiratory distress.     Breath sounds: Normal breath sounds. No wheezing, rhonchi or rales.  Chest:     Chest wall: No tenderness.  Abdominal:     General: Abdomen is flat.     Palpations: Abdomen is soft.     Tenderness: There is no abdominal tenderness. There is no right CVA tenderness, left CVA tenderness, guarding or rebound.  Musculoskeletal:        General: No swelling or tenderness.     Cervical back: Neck supple. No tenderness.     Right lower leg: No edema.     Left lower leg: No edema.  Skin:    General: Skin is warm and dry.     Capillary Refill: Capillary refill takes less than 2 seconds.     Findings: No erythema.  Neurological:     Mental Status: She is alert.  Psychiatric:        Mood and Affect: Mood is anxious.     ED Results / Procedures / Treatments   Labs (all labs ordered are listed, but only abnormal results are displayed) Labs Reviewed  CBC WITH DIFFERENTIAL/PLATELET - Abnormal; Notable for the following components:      Result Value   RBC 3.65 (*)    Hemoglobin 11.4 (*)    HCT 35.4 (*)    All other components within normal limits  COMPREHENSIVE METABOLIC PANEL - Abnormal; Notable for the following components:   Sodium 130 (*)    CO2 <7 (*)    Glucose, Bld 617 (*)    Creatinine, Ser 1.30 (*)    Calcium 8.5 (*)    Total Protein 6.4 (*)    Albumin 3.0 (*)    Total Bilirubin 2.3 (*)    GFR, Estimated 51 (*)    All other components within normal limits  BETA-HYDROXYBUTYRIC ACID - Abnormal; Notable for the following components:   Beta-Hydroxybutyric Acid >8.00 (*)    All other components within normal limits  BLOOD GAS, VENOUS - Abnormal; Notable for the following components:   pH, Ven 7.13 (*)    pCO2, Ven <18 (*)    pO2,  Ven 57 (*)    Bicarbonate 5.3 (*)    Acid-base deficit 21.7 (*)    All other components within normal limits  LACTIC ACID, PLASMA - Abnormal; Notable for the following components:   Lactic Acid, Venous 4.4 (*)    All other components within normal limits  LIPASE, BLOOD - Abnormal; Notable for the following components:   Lipase 78 (*)    All other components within normal limits  BASIC METABOLIC PANEL - Abnormal; Notable for the following components:   Chloride 112 (*)  CO2 11 (*)    Glucose, Bld 151 (*)    Calcium 7.7 (*)    All other components within normal limits  PHOSPHORUS - Abnormal; Notable for the following components:   Phosphorus 1.9 (*)    All other components within normal limits  GLUCOSE, CAPILLARY - Abnormal; Notable for the following components:   Glucose-Capillary 140 (*)    All other components within normal limits  GLUCOSE, CAPILLARY - Abnormal; Notable for the following components:   Glucose-Capillary 178 (*)    All other components within normal limits  GLUCOSE, CAPILLARY - Abnormal; Notable for the following components:   Glucose-Capillary 165 (*)    All other components within normal limits  CBG MONITORING, ED - Abnormal; Notable for the following components:   Glucose-Capillary 577 (*)    All other components within normal limits  CBG MONITORING, ED - Abnormal; Notable for the following components:   Glucose-Capillary 227 (*)    All other components within normal limits  CBG MONITORING, ED - Abnormal; Notable for the following components:   Glucose-Capillary 159 (*)    All other components within normal limits  MRSA NEXT GEN BY PCR, NASAL  CALPROTECTIN, FECAL  LACTIC ACID, PLASMA  HCG, SERUM, QUALITATIVE  MAGNESIUM  URINALYSIS, W/ REFLEX TO CULTURE (INFECTION SUSPECTED)  BETA-HYDROXYBUTYRIC ACID  BASIC METABOLIC PANEL  LACTIC ACID, PLASMA  PANCREATIC ELASTASE, FECAL    EKG EKG Interpretation Date/Time:  Sunday June 28 2023 08:21:25  EST Ventricular Rate:  113 PR Interval:  148 QRS Duration:  113 QT Interval:  379 QTC Calculation: 518 R Axis:   86  Text Interpretation: Sinus tachycardia Borderline intraventricular conduction delay Prolonged QT interval when compared to prior, similar appearance with more wandering baseline and longer QTC. No STEMI Confirmed by Theda Belfast ([id number]) on 06/28/2023 9:56:49 AM  Radiology No results found.  Procedures Procedures    CRITICAL CARE Performed by: Canary Brim Kahlee Metivier Total critical care time: 40 minutes Critical care time was exclusive of separately billable procedures and treating other patients. Critical care was necessary to treat or prevent imminent or life-threatening deterioration. Critical care was time spent personally by me on the following activities: development of treatment plan with patient and/or surrogate as well as nursing, discussions with consultants, evaluation of patient's response to treatment, examination of patient, obtaining history from patient or surrogate, ordering and performing treatments and interventions, ordering and review of laboratory studies, ordering and review of radiographic studies, pulse oximetry and re-evaluation of patient's condition.   Medications Ordered in ED Medications  insulin regular, human (MYXREDLIN) 100 units/ 100 mL infusion (1.1 Units/hr Intravenous Infusion Verify 06/28/23 1353)  lactated ringers infusion ( Intravenous Infusion Verify 06/28/23 1353)  dextrose 5 % in lactated ringers infusion ( Intravenous Stopped 06/28/23 1210)  dextrose 50 % solution 0-50 mL (has no administration in time range)  prochlorperazine (COMPAZINE) injection 10 mg (has no administration in time range)  acetaminophen (TYLENOL) tablet 650 mg (has no administration in time range)    Or  acetaminophen (TYLENOL) suppository 650 mg (has no administration in time range)  Oral care mouth rinse (has no administration in time range)   Chlorhexidine Gluconate Cloth 2 % PADS 6 each (6 each Topical Given 06/28/23 1200)  magnesium sulfate IVPB 2 g 50 mL (has no administration in time range)  phosphorus (K PHOS NEUTRAL) tablet 250 mg (has no administration in time range)  glycopyrrolate (ROBINUL) tablet 2 mg (has no administration in time range)  psyllium (HYDROCIL/METAMUCIL) 1  packet (has no administration in time range)  sodium chloride 0.9 % bolus 1,000 mL (0 mLs Intravenous Stopped 06/28/23 0906)  ondansetron (ZOFRAN) injection 4 mg (4 mg Intravenous Given 06/28/23 0815)  lactated ringers bolus 1,242 mL (0 mLs Intravenous Stopped 06/28/23 0936)  potassium chloride 10 mEq in 100 mL IVPB (0 mEq Intravenous Paused 06/28/23 1128)  pantoprazole (PROTONIX) injection 40 mg (40 mg Intravenous Given 06/28/23 1400)  lactated ringers bolus 1,000 mL ( Intravenous Stopped 06/28/23 1313)  lactated ringers bolus 500 mL (500 mLs Intravenous New Bag/Given 06/28/23 1405)    ED Course/ Medical Decision Making/ A&P                                 Medical Decision Making Amount and/or Complexity of Data Reviewed Labs: ordered.  Risk Prescription drug management. Decision regarding hospitalization.    APHRODITE WILLEFORD is a 47 y.o. female with past medical history significant for type 1 diabetes with previous DKA, GERD, hyperlipidemia, ADHD, anxiety, and chronic diarrhea who presents with nausea, vomiting, diarrhea, dehydration, and hyperglycemia.  According to patient, she was admitted yesterday but had to leave after several hours because she needed to go to work.  She reports that she did not take any insulin since leaving and is concerned she is going to DKA again.  She reports nausea and vomiting and persistent diarrhea.  She reports her mouth is dry and she feels dehydrated.  She is breathing heavily and she is tachycardic.  She is having cramping all over her body and feels similar to when she has had DKA in the past.  She reports  that she is willing to be admitted now due to how bad she is feeling.  She reports no other injuries.  She is denying any urinary changes.  She denies any fevers, chills, congestion, cough, or URI symptoms.  Is not reporting chest pain or shortness of breath at this time.  Prior to arrival glucose was found to be 577.  On exam, lungs clear.  She is tachypneic, she is tachycardic.  Chest and abdomen are nontender.  Good bowel sounds.  She is moving all extremities.  Dry because membranes.  Patient is anxious at the bedside.  Clinically concerned patient is going into DKA again.  Due to her feeling worse than she did yesterday, anticipate she will need admission.  Will get repeat labs to look for DKA and give her some fluids and nausea medicine to start with.  Anticipate admission on insulin drip after workup is completed.               9:08 AM Initial labs do indicate DKA that appears worse than yesterday.  Will order the Endo tool and other orders in the order set.  When labs are completed, will admit for further management.         Final Clinical Impression(s) / ED Diagnoses Final diagnoses:  Dehydration  Diabetic ketoacidosis without coma associated with type 1 diabetes mellitus (HCC)    Clinical Impression: 1. Dehydration   2. Diabetic ketoacidosis without coma associated with type 1 diabetes mellitus (HCC)     Disposition: Admit  This note was prepared with assistance of Dragon voice recognition software. Occasional wrong-word or sound-a-like substitutions may have occurred due to the inherent limitations of voice recognition software.      Airiana Elman, Canary Brim, MD 06/28/23 1432

## 2023-06-28 NOTE — ED Notes (Signed)
Attempted IV upon trying to advance pt upset and demanded IV be taken out. Complied with pt request.

## 2023-06-28 NOTE — Consult Note (Addendum)
Consultation  Referring Provider: [facility]/ Robb Matar Primary Care Physician:  Jackelyn Poling, DO Primary Gastroenterologist:  Dr.Nandigam  Reason for Consultation:  "Diarrhea"/incontinence  HPI: Connie Vaughn is a 47 y.o. female with history of type 1 diabetes, ADHD, anemia, GERD symptoms on no current medication, hyperlipidemia and history of DKA who presented to the emergency room this morning with complaints of fatigue, nausea abdominal pain and multiple loose bowel movements last night which occurred while she was asleep.  She had presented to the emergency room yesterday with similar symptoms, at that time was mildly hypotensive, and had metabolic acidosis.  She was treated with IV fluids, with improvement in parameters and requested discharge to home as she needed to be at work today. Unfortunately she says that she feels worse today, had incontinent stool during the night, and presented back to the emergency room at which time she has been diagnosed with DKA, with glucose of 617, lactate 4.4, bicarb 5.3/acid-base deficit of 21.7 mmol/L.  Beta hydroxybutyric acid greater than 8.  CO2 less than 7 mmol/L.  She is currently admitted to stepdown for DKA management with insulin infusion.  We are asked to see her for diarrhea however on conversation today with the patient she says that her current symptoms started somewhere around August 2024 with increased frequency of bowel movements,, but not diarrhea.  She says sometimes she will have loose stools, sometimes she is constipated, frequently has urgency for a bowel movement and if any urgency present she will generally be incontinent.  She is very frustrated at present because symptoms have progressed over the past few months, she is having to wear a diaper on a regular basis, and says that frequently at night after she has gone to sleep she wakes up having had 1 or more bowel movements sometimes formed, sometimes soft.  During the daytime, she says stool  just comes out intermittently sometimes just enough to cause perianal irritation, and sometimes a more significant amount of stool.  She says frequently when she goes to the bathroom to try to have a bowel movement she will have no urgency and is unable to pass any stool, only later to be incontinent.  Not having any abdominal pain or cramping no melena or hematochezia. She did take several rounds of antibiotics in the past 6 months for foot infection, no other new medications, changes in diet etc.  She has been losing some weight recently due to being ill with hyperglycemia nausea etc. She also mentions that she is having a lot of heartburn symptoms recently on a daily basis, and does not have any medication for that. She has not had any problems prior to August with known incontinence issues, diabetic polyneuropathy is listed on her chronic problems but she says that she is not aware of any significant neuropathy symptoms.  Denies any difficulty with urinary incontinence.  She had been referred to our office for screening colonoscopy which had been done by Dr. Lavon Paganini actually on 06/09/2023.  She has not been seen in our office other than for the screening colonoscopy. Colonoscopy revealed a 4 mm polyp and internal and external hemorrhoids Path showed a tubular adenoma.  She had CT of the abdomen pelvis in September 2024 with contrast, this showed hepatomegaly, unremarkable pancreas, No mention of any bowel wall thickening or edema, no ascites.  Labs today-WBC 7.6/hemoglobin 11.4/hematocrit 35.4/MCV 97 Sodium 130/potassium 4.6/CO2 less than 7/glucose 617 BUN 18/creatinine 1.3 Albumin 3 LFTs within normal limits Lipase 78 Beta  hydroxybutyric acid greater than 8 Initial lactate today 1.1 repeat 4.4     Past Medical History:  Diagnosis Date   ADHD (attention deficit hyperactivity disorder)    AKI (acute kidney injury) (HCC) 10/06/2020   Anemia    Blood transfusion without reported  diagnosis    Constipation    Diabetes mellitus type 1, uncontrolled    DKA (diabetic ketoacidosis) (HCC) 10/06/2020   GERD (gastroesophageal reflux disease)    Hyperlipidemia    Incontinence, feces     Past Surgical History:  Procedure Laterality Date   I & D EXTREMITY Right 03/09/2021   Procedure: IRRIGATION AND DEBRIDEMENT RIGHT RING FINGER;  Surgeon: Betha Loa, MD;  Location: [facility] OR;  Service: Orthopedics;  Laterality: Right;   TUBAL LIGATION      Prior to Admission medications   Medication Sig Start Date End Date Taking? Authorizing Provider  insulin degludec (TRESIBA FLEXTOUCH) 100 UNIT/ML FlexTouch Pen Inject 10-15 Units into the skin at bedtime. 08/22/19  Yes [provider]  LYUMJEV KWIKPEN 100 UNIT/ML KwikPen Inject 10-12 Units into the skin 3 (three) times daily with meals. 04/03/21  Yes [provider]  Continuous Glucose Sensor (FREESTYLE LIBRE 3 SENSOR) MISC 1 each every 14 (fourteen) days.    [provider]  glucose blood test strip Use as instructed Patient taking differently: 1 each by Other route in the morning, at noon, and at bedtime. Use as instructed 11/18/17   [name], NP  Insulin Pen Needle (B-D UF III MINI PEN NEEDLES) 31G X 5 MM MISC Use as instructed Patient taking differently: 1 each by Other route in the morning, at noon, and at bedtime. Use as instructed 11/18/17   [name], NP  Lancets MISC 1 each by Does not apply route in the morning, at noon, and at bedtime. 07/09/13   [provider]  loperamide (IMODIUM) 2 MG capsule Take 1 capsule (2 mg total) by mouth as needed for diarrhea or loose stools. 06/27/23   [name], DO    Current Facility-Administered Medications  Medication Dose Route Frequency Provider Last Rate Last Admin   acetaminophen (TYLENOL) tablet 650 mg  650 mg Oral Q6H PRN Bobette Mo, MD       Or   acetaminophen (TYLENOL) suppository 650 mg  650 mg Rectal Q6H PRN Bobette Mo, MD       Chlorhexidine Gluconate Cloth 2 % PADS 6 each  6 each Topical Daily Bobette Mo, MD   6 each at 06/28/23 1200   dextrose 5 % in lactated ringers infusion   Intravenous Continuous Tegeler, Canary Brim, MD   Stopped at 06/28/23 1210   dextrose 50 % solution 0-50 mL  0-50 mL Intravenous PRN Tegeler, Canary Brim, MD       insulin regular, human (MYXREDLIN) 100 units/ 100 mL infusion   Intravenous Continuous Tegeler, Canary Brim, MD 1.1 mL/hr at 06/28/23 1353 1.1 Units/hr at 06/28/23 1353   lactated ringers infusion   Intravenous Continuous Tegeler, Canary Brim, MD 125 mL/hr at 06/28/23 1353 Infusion Verify at 06/28/23 1353   magnesium sulfate IVPB 2 g 50 mL  2 g Intravenous Once Bobette Mo, MD       Oral care mouth rinse  15 mL Mouth Rinse PRN Bobette Mo, MD       phosphorus (K PHOS NEUTRAL) tablet 250 mg  250 mg Oral QID Bobette Mo, MD       prochlorperazine (  COMPAZINE) injection 10 mg  10 mg Intravenous Q6H PRN Bobette Mo, MD        Allergies as of 06/28/2023 - Review Complete 06/28/2023  Allergen Reaction Noted   Sulfa antibiotics Hives 10/16/2017    Family History  Problem Relation Age of Onset   Heart disease Mother    Congestive Heart Failure Mother    Congestive Heart Failure Sister    Diabetes Maternal Grandmother    Colon polyps Neg Hx    Crohn's disease Neg Hx    Esophageal cancer Neg Hx    Rectal cancer Neg Hx    Stomach cancer Neg Hx     Social History   Socioeconomic History   Marital status: Married    Spouse name: Not on file   Number of children: Not on file   Years of education: Not on file   Highest education level: Not on file  Occupational History   Not on file  Tobacco Use   Smoking status: Some Days    Current packs/day: 0.25    Average packs/day: 0.3 packs/day for 2.0 years (0.5 ttl pk-yrs)    Types: Cigarettes   Smokeless tobacco: Never   Tobacco comments:    also uses Black and  Mild  Vaping Use   Vaping status: Never Used  Substance and Sexual Activity   Alcohol use: Never   Drug use: Yes    Comment: CBD edibles   Sexual activity: Yes    Birth control/protection: Surgical    Comment: Tubal Ligation  Other Topics Concern   Not on file  Social History Narrative   Not on file   Social Drivers of Health   Financial Resource Strain: Medium Risk (08/18/2022)   Received from [facility], [facility]   Overall Financial Resource Strain (CARDIA)    Difficulty of Paying Living Expenses: Somewhat hard  Food Insecurity: No Food Insecurity (03/08/2023)   Hunger Vital Sign    Worried About Running Out of Food in the Last Year: Never true    Ran Out of Food in the Last Year: Never true  Transportation Needs: No Transportation Needs (03/08/2023)   PRAPARE - Administrator, Civil Service (Medical): No    Lack of Transportation (Non-Medical): No  Physical Activity: Unknown (08/18/2022)   Received from [facility]   Exercise Vital Sign    Days of Exercise per Week: 0 days    Minutes of Exercise per Session: Not on file  Recent Concern: Physical Activity - Inactive (08/18/2022)   Received from [facility], [facility]   Exercise Vital Sign    Days of Exercise per Week: 0 days    Minutes of Exercise per Session: 60 min  Stress: Stress Concern Present (08/18/2022)   Received from [facility], [facility] - Occupational Stress Questionnaire    Feeling of Stress : To some extent  Social Connections: Somewhat Isolated (08/18/2022)   Received from [facility], [facility]   Social Network    How would you rate your social network (family, work, friends)?: Restricted participation with some degree of social isolation  Intimate Partner Violence: Not At Risk (03/08/2023)   Humiliation, Afraid, Rape, and Kick questionnaire    Fear of Current or Ex-Partner: No    Emotionally Abused: No    Physically Abused: No     Sexually Abused: No    Review of Systems: Pertinent positive and negative review of systems were noted in  the above HPI section.  All other review of systems was otherwise negative.   Physical Exam: Vital signs in last 24 hours: Temp:  [97.4 F (36.3 C)-98 F (36.7 C)] 97.7 F (36.5 C) (12/29 1124) Pulse Rate:  [97-116] 102 (12/29 1300) Resp:  [12-25] 21 (12/29 1300) BP: (87-155)/(50-91) 145/78 (12/29 1300) SpO2:  [98 %-100 %] 100 % (12/29 1300) Weight:  [59.9 kg-62.1 kg] 59.9 kg (12/29 1124) Last BM Date :  (PTA) General:   Alert,  Well-developed, thin older female , pleasant and cooperative in NAD Head:  Normocephalic and atraumatic. Eyes:  Sclera clear, no icterus.   Conjunctiva pink. Ears:  Normal auditory acuity. Nose:  No deformity, discharge,  or lesions. Mouth:  No deformity or lesions.  dry Neck:  Supple; no masses or thyromegaly. Lungs:  Clear throughout to auscultation.   No wheezes, crackles, or rhonchi.  Heart:  tachyRegular rate and rhythm; no murmurs, clicks, rubs,  or gallops. Abdomen:  Soft,nontender, BS active,nonpalp mass or hsm.   Rectal: not done today  Msk:  Symmetrical without gross deformities. . Pulses:  Normal pulses noted. Extremities:  Without clubbing or edema. Neurologic:  Alert and  oriented x4;  grossly normal neurologically. Skin:  Intact without significant lesions or rashes.. Psych:  Alert and cooperative. Normal mood and affect.anxious ,tearful  Intake/Output from previous day: No intake/output data recorded. Intake/Output this shift: Total I/O In: 3033.5 [I.V.:493.4; IV Piggyback:2540.1] Out: -   Lab Results: Recent Labs    06/27/23 1640 06/28/23 0754  WBC 7.6 7.6  HGB 12.0 11.4*  HCT 35.9* 35.4*  PLT 410* 391   BMET Recent Labs    06/27/23 2008 06/28/23 0754 06/28/23 1152  NA 138 130* 136  K 3.9 4.6 4.3  CL 106 100 112*  CO2 19* <7* 11*  GLUCOSE 119* 617* 151*  BUN 18 18 13   CREATININE 0.80 1.30* 0.86  CALCIUM  8.1* 8.5* 7.7*   LFT Recent Labs    06/28/23 0754  PROT 6.4*  ALBUMIN 3.0*  AST 20  ALT 19  ALKPHOS 101  BILITOT 2.3*   PT/INR No results for input(s): "LABPROT", "INR" in the last 72 hours. Hepatitis Panel No results for input(s): "HEPBSAG", "HCVAB", "HEPAIGM", "HEPBIGM" in the last 72 hours.   IMPRESSION:  #39 47 year old female with type 1 diabetes, admitted today with DKA. She had been in the ER yesterday with similar symptoms but requested discharge due to concerns about needing to be at work, returned this morning due to worsening symptoms  #2  77-month history of somewhat progressive issues with fecal incontinence.  She is not really having ongoing problems with diarrhea, the issue is urgency, full fecal incontinence and episodes of partial fecal incontinence quickly happening during the night while she is asleep but also occurring during the day requiring her to wear a diaper.  Most of her stools are formed or soft occasionally has a diarrheal stool.  I suspect her symptoms are secondary to diabetic neuropathy, and somewhat progressive due to poor control of her blood sugars over the past couple of months.  She just had a colonoscopy on 06/09/2023 for screening, noted internal and external hemorrhoids and 1 small tubular adenomatous polyp was removed.  #3 GERD-poor control on no medication #4.  ADHD #5 mild anemia normocytic  PLAN: Start daily fiber supplement/Benefiber Will also start a trial of glycopyrrolate 2 mg p.o. twice daily/antispasmodic to see if this will help with the remittent urgency symptoms and perhaps prevent some  of the nocturnal symptoms Will check fecal elastase, fecal calprotectin I think she will need anorectal manometry which will need to be scheduled as an outpatient, and will also need to be referred to pelvic floor physical therapy Did discuss with her the importance of good blood sugar control to help with any neuropathy symptoms. We will also  get her a follow-up outpatient appointment with Dr. Alcide Clever [facility] advanced 2-3 times daily as needed for perianal irritation due to fecal smearing.   Aubrey Voong PA-C 06/28/2023, 2:06 PM

## 2023-06-29 ENCOUNTER — Other Ambulatory Visit: Payer: Self-pay

## 2023-06-29 ENCOUNTER — Telehealth: Payer: Self-pay

## 2023-06-29 DIAGNOSIS — E1010 Type 1 diabetes mellitus with ketoacidosis without coma: Secondary | ICD-10-CM | POA: Diagnosis not present

## 2023-06-29 DIAGNOSIS — E876 Hypokalemia: Secondary | ICD-10-CM | POA: Insufficient documentation

## 2023-06-29 DIAGNOSIS — R152 Fecal urgency: Secondary | ICD-10-CM

## 2023-06-29 LAB — COMPREHENSIVE METABOLIC PANEL
ALT: 16 U/L (ref 0–44)
AST: 25 U/L (ref 15–41)
Albumin: 2.5 g/dL — ABNORMAL LOW (ref 3.5–5.0)
Alkaline Phosphatase: 81 U/L (ref 38–126)
Anion gap: 8 (ref 5–15)
BUN: 9 mg/dL (ref 6–20)
CO2: 18 mmol/L — ABNORMAL LOW (ref 22–32)
Calcium: 7.7 mg/dL — ABNORMAL LOW (ref 8.9–10.3)
Chloride: 108 mmol/L (ref 98–111)
Creatinine, Ser: 0.7 mg/dL (ref 0.44–1.00)
GFR, Estimated: >60 mL/min (ref >60)
Glucose, Bld: 155 mg/dL — ABNORMAL HIGH (ref 70–99)
Potassium: 3.4 mmol/L — ABNORMAL LOW (ref 3.5–5.1)
Sodium: 134 mmol/L — ABNORMAL LOW (ref 135–145)
Total Bilirubin: 0.3 mg/dL (ref <1.2)
Total Protein: 5.4 g/dL — ABNORMAL LOW (ref 6.5–8.1)

## 2023-06-29 LAB — GLUCOSE, CAPILLARY
Glucose-Capillary: 126 mg/dL — ABNORMAL HIGH (ref 70–99)
Glucose-Capillary: 147 mg/dL — ABNORMAL HIGH (ref 70–99)
Glucose-Capillary: 150 mg/dL — ABNORMAL HIGH (ref 70–99)
Glucose-Capillary: 151 mg/dL — ABNORMAL HIGH (ref 70–99)
Glucose-Capillary: 153 mg/dL — ABNORMAL HIGH (ref 70–99)
Glucose-Capillary: 157 mg/dL — ABNORMAL HIGH (ref 70–99)
Glucose-Capillary: 162 mg/dL — ABNORMAL HIGH (ref 70–99)
Glucose-Capillary: 181 mg/dL — ABNORMAL HIGH (ref 70–99)
Glucose-Capillary: 198 mg/dL — ABNORMAL HIGH (ref 70–99)
Glucose-Capillary: 200 mg/dL — ABNORMAL HIGH (ref 70–99)

## 2023-06-29 LAB — BASIC METABOLIC PANEL
Anion gap: 10 (ref 5–15)
Anion gap: 9 (ref 5–15)
BUN: 10 mg/dL (ref 6–20)
BUN: 8 mg/dL (ref 6–20)
CO2: 17 mmol/L — ABNORMAL LOW (ref 22–32)
CO2: 18 mmol/L — ABNORMAL LOW (ref 22–32)
Calcium: 7.8 mg/dL — ABNORMAL LOW (ref 8.9–10.3)
Calcium: 8 mg/dL — ABNORMAL LOW (ref 8.9–10.3)
Chloride: 108 mmol/L (ref 98–111)
Chloride: 110 mmol/L (ref 98–111)
Creatinine, Ser: 0.67 mg/dL (ref 0.44–1.00)
Creatinine, Ser: 0.75 mg/dL (ref 0.44–1.00)
GFR, Estimated: >60 mL/min (ref >60)
GFR, Estimated: >60 mL/min (ref >60)
Glucose, Bld: 154 mg/dL — ABNORMAL HIGH (ref 70–99)
Glucose, Bld: 185 mg/dL — ABNORMAL HIGH (ref 70–99)
Potassium: 3.3 mmol/L — ABNORMAL LOW (ref 3.5–5.1)
Potassium: 3.6 mmol/L (ref 3.5–5.1)
Sodium: 136 mmol/L (ref 135–145)
Sodium: 136 mmol/L (ref 135–145)

## 2023-06-29 LAB — CBC
HCT: 29.1 % — ABNORMAL LOW (ref 36.0–46.0)
Hemoglobin: 9.3 g/dL — ABNORMAL LOW (ref 12.0–15.0)
MCH: 30.6 pg (ref 26.0–34.0)
MCHC: 32 g/dL (ref 30.0–36.0)
MCV: 95.7 fL (ref 80.0–100.0)
Platelets: 328 10*3/uL (ref 150–400)
RBC: 3.04 MIL/uL — ABNORMAL LOW (ref 3.87–5.11)
RDW: 14.9 % (ref 11.5–15.5)
WBC: 5.4 10*3/uL (ref 4.0–10.5)
nRBC: 0 % (ref 0.0–0.2)

## 2023-06-29 LAB — BETA-HYDROXYBUTYRIC ACID
Beta-Hydroxybutyric Acid: 0.45 mmol/L — ABNORMAL HIGH (ref 0.05–0.27)
Beta-Hydroxybutyric Acid: 1.99 mmol/L — ABNORMAL HIGH (ref 0.05–0.27)

## 2023-06-29 MED ORDER — LISINOPRIL 20 MG PO TABS: 20.0000 mg | ORAL_TABLET | Freq: Every day | ORAL | 1 refills | Status: DC

## 2023-06-29 MED ORDER — INSULIN GLARGINE-YFGN 100 UNIT/ML ~~LOC~~ SOLN
10.0000 [IU] | Freq: Every day | SUBCUTANEOUS | Status: DC
  Administered 2023-06-29: 10 [IU] via SUBCUTANEOUS
  Filled 2023-06-29: qty 0.1

## 2023-06-29 MED ORDER — POTASSIUM CHLORIDE CRYS ER 20 MEQ PO TBCR
40.0000 meq | EXTENDED_RELEASE_TABLET | Freq: Once | ORAL | Status: AC
  Administered 2023-06-29: 40 meq via ORAL
  Filled 2023-06-29: qty 2

## 2023-06-29 MED ORDER — LOPERAMIDE HCL 2 MG PO CAPS: 2.0000 mg | ORAL_CAPSULE | ORAL | 0 refills | Status: DC | PRN

## 2023-06-29 MED ORDER — INSULIN ASPART 100 UNIT/ML IJ SOLN
0.0000 [IU] | Freq: Three times a day (TID) | INTRAMUSCULAR | Status: DC
  Administered 2023-06-29: 2 [IU] via SUBCUTANEOUS

## 2023-06-29 MED ORDER — LISINOPRIL 10 MG PO TABS
20.0000 mg | ORAL_TABLET | Freq: Every day | ORAL | Status: DC
  Administered 2023-06-29: 20 mg via ORAL
  Filled 2023-06-29: qty 2

## 2023-06-29 MED ORDER — INSULIN ASPART 100 UNIT/ML IJ SOLN: 0.0000 [IU] | Freq: Every day | INTRAMUSCULAR | Status: DC

## 2023-06-29 MED ORDER — INSULIN ASPART 100 UNIT/ML IJ SOLN: 4.0000 [IU] | Freq: Three times a day (TID) | INTRAMUSCULAR | Status: DC

## 2023-06-29 MED ORDER — PSYLLIUM 95 % PO PACK: 1.0000 | PACK | Freq: Every day | ORAL | 1 refills | Status: DC

## 2023-06-29 NOTE — Progress Notes (Signed)
Chaplain met with Connie Vaughn prior to discharge about advance care planning.  She feels that her life is coming to an end and does not want to burden her husband or sons with the decision to have to "pull the plug."  She wants her wishes clearly documented.  Chaplain provided education about how to complete paperwork and provided her with assistance in planning to get papers notarized after discharge.  Chaplain provided care to both her and her husband who agreed to be her HCPOA, but was grateful that she is making her wishes known in the living will portion of the paperwork.  Connie Vaughn also stated that she wants to donate her body to science as well and chaplain encouraged her to state that on the document.  [address] Bcc Pager, [phone]

## 2023-06-29 NOTE — Discharge Summary (Signed)
Physician Discharge Summary  Connie Vaughn:[id number] DOB: 1976-04-05 DOA: 06/28/2023  PCP: Jackelyn Poling, DO  Admit date: 06/28/2023 Discharge date: 06/29/2023  Admitted From: home Disposition:  home  Recommendations for Outpatient Follow-up:  Follow up with PCP Follow up with GI   Discharge Condition: Stable CODE STATUS: Full  Diet recommendation: Carb modified   Brief/Interim Summary: From H&P by Dr. Robb Matar: "Connie Vaughn is a 47 y.o. female with medical history significant of ADHD, AKI, unspecified anemia, history of blood transfusion, constipation, GERD, hyperlipidemia, history of fecal incontinence, type 1 diabetes, history of DKA who presented yesterday to the emergency department with complaints of fatigue, nausea, multiple episodes of diarrhea associated with abdominal pain.  She was admitted by our night shift team, but did not stay because she had to be work today at 1400.  She went home note with instructions to follow-up with [facility].  However, the patient stated that yesterday she did not use insulin at home and the only insulin she received was in the emergency department.  She has also had 3 additional episodes of diarrhea.  She had an episode of emesis this morning.  She has been feeling thirsty no fever, chills or night sweats. No sore throat, rhinorrhea, dyspnea, wheezing or hemoptysis.  No chest pain, palpitations, diaphoresis, PND, orthopnea or pitting edema of the lower extremities.  No constipation, melena or hematochezia.  No flank pain, dysuria, frequency or hematuria.  The patient stated that she had a good amount of urinary output after IV fluids in the ED.    Lab work: Serum pregnancy test is negative.  Normal lactic acid.  CBC showed a white count 7.6, hemoglobin 11.4 g/dL platelets 956.  Lipase was 78 units/L.  Venous blood gas showed a pH of 7.13, pCO2 less than 18 and pO2 of 57 mmHg.  Bicarbonate was 5.3 and acid-base deficit of 21.7 mmol/L.  O2  saturation 86%.  Beta hydroxybutyric acid was greater than 8.00 mmol/L.  CMP showed a sodium 130, potassium 4.6, chloride 100 and CO2 less than 7 mmol/L with a known calculated anion gap.  Total bilirubin 2.3, glucose is 117, BUN 18 and creatinine 1.30 mg/deciliter.  Calcium normalizes after correction to albumin level.  Total protein 6.4 and albumin 3.0 g/dL.  AST, ALT and alkaline phosphatase were normal.   ED course: Initial vital signs were temperature 97.6 F, pulse 916, respiration 20, BP 107/57 mmHg and O2 sat 100% on room air.  The patient received LR 1242 mL bolus, normal saline 1000 mL liter bolus, ondansetron 4 mg IVP, potassium chloride 10 mEq IVPB x 2 doses and was started on an insulin infusion with maintenance fluids."  Subjective on day of discharge: Due to diarrhea, GI was consulted and felt that her fecal incontinence were related to pelvic floor dysfunction. They recommended outpatient anorectal manometry and pelvic floor PT. Her metabolic panel continued to improve on IV insulin and IVF. She was transitioned to subcutaneous insulin for discharge. She was encouraged to cut out artificial sugars, energy drinks, and to keep a food diary.   Discharge Diagnoses:   Principal Problem:   DKA (diabetic ketoacidosis) (HCC) Active Problems:   Hyperlipidemia   Diabetic polyneuropathy associated with type 1 diabetes mellitus (HCC)   Type 1 diabetes mellitus with hyperglycemia (HCC)   AKI (acute kidney injury) (HCC)   Intractable diarrhea   Tachycardia   Elevated lipase   Mild protein malnutrition (HCC)   Normocytic anemia   Prolonged QT interval  Hypophosphatemia   Lactic acidosis   Hypokalemia    Discharge Instructions  Discharge Instructions     Call MD for:  difficulty breathing, headache or visual disturbances   Complete by: As directed    Call MD for:  extreme fatigue   Complete by: As directed    Call MD for:  persistant dizziness or light-headedness   Complete by: As  directed    Call MD for:  persistant nausea and vomiting   Complete by: As directed    Call MD for:  severe uncontrolled pain   Complete by: As directed    Call MD for:  temperature >100.4   Complete by: As directed    Diet Carb Modified   Complete by: As directed    Discharge instructions   Complete by: As directed    You were cared for by a hospitalist during your hospital stay. If you have any questions about your discharge medications or the care you received while you were in the hospital after you are discharged, you can call the unit and ask to speak with the hospitalist on call if the hospitalist that took care of you is not available. Once you are discharged, your primary care physician will handle any further medical issues. Please note that NO REFILLS for any discharge medications will be authorized once you are discharged, as it is imperative that you return to your primary care physician (or establish a relationship with a primary care physician if you do not have one) for your aftercare needs so that they can reassess your need for medications and monitor your lab values.   Increase activity slowly   Complete by: As directed       Allergies as of 06/29/2023       Reactions   Sulfa Antibiotics Hives        Medication List     TAKE these medications    FreeStyle Libre 3 Sensor Misc 1 each every 14 (fourteen) days.   glucose blood test strip Use as instructed   Insulin Pen Needle 31G X 5 MM Misc Commonly known as: B-D UF III MINI PEN NEEDLES Use as instructed   Lancets Misc 1 each by Does not apply route in the morning, at noon, and at bedtime.   lisinopril 20 MG tablet Commonly known as: ZESTRIL Take 1 tablet (20 mg total) by mouth daily.   loperamide 2 MG capsule Commonly known as: IMODIUM Take 1 capsule (2 mg total) by mouth as needed for diarrhea or loose stools (Can take 2 capsules before bedtime and take 1 capsule as needed during the day for  diarrhea.). What changed: reasons to take this   Lyumjev KwikPen 100 UNIT/ML KwikPen Generic drug: Insulin Lispro-aabc Inject 10-12 Units into the skin 3 (three) times daily with meals.   psyllium 95 % Pack Commonly known as: HYDROCIL/METAMUCIL Take 1 packet by mouth daily.   [name] FlexTouch 100 UNIT/ML FlexTouch Pen Generic drug: insulin degludec Inject 10-15 Units into the skin at bedtime.        Follow-up Information     Napoleon Form, MD Follow up.   Specialty: Gastroenterology Contact information: [address] [place] [address] [phone]         Jackelyn Poling, DO. Schedule an appointment as soon as possible for a visit in 1 week(s).   Specialty: Family Medicine Contact information: [address] [place] [address] [phone]  Allergies  Allergen Reactions   Sulfa Antibiotics Hives    Consultations: GI    Procedures/Studies: No results found.     Discharge Exam: Vitals:   06/29/23 0500 06/29/23 0700  BP: (!) 131/56   Pulse: 86   Resp: 11   Temp:  98.1 F (36.7 C)  SpO2: 97%     General: Pt is alert, awake, not in acute distress Cardiovascular: RRR, S1/S2 +, no edema Respiratory: CTA bilaterally, no wheezing, no rhonchi, no respiratory distress, no conversational dyspnea  Abdominal: Soft, NT, ND, bowel sounds + Extremities: no edema, no cyanosis Psych: Normal mood and affect, stable judgement and insight     The results of significant diagnostics from this hospitalization (including imaging, microbiology, ancillary and laboratory) are listed below for reference.     Microbiology: No results found for this or any previous visit (from the past 240 hours).   Labs: BNP (last 3 results) No results for input(s): "BNP" in the last 8760 hours. Basic Metabolic Panel: Recent Labs  Lab 06/27/23 1640 06/27/23 2008 06/28/23 1152 06/28/23 1659 06/28/23 1945 06/29/23 0021 06/29/23 0300  06/29/23 0715  NA 135   < > 136 136 136 136 134* 136  K 3.6   < > 4.3 3.7 3.5 3.6 3.4* 3.3*  CL 101   < > 112* 110 109 110 108 108  CO2 14*   < > 11* 14* 13* 17* 18* 18*  GLUCOSE 342*   < > 151* 139* 269* 185* 155* 154*  BUN 23*   < > 13 11 11 10 9 8   CREATININE 1.19*   < > 0.86 0.77 0.90 0.75 0.70 0.67  CALCIUM 9.1   < > 7.7* 7.8* 7.8* 7.8* 7.7* 8.0*  MG 2.2  --  2.1  --   --   --   --   --   PHOS 3.9  --  1.9*  --   --   --   --   --    < > = values in this interval not displayed.   Liver Function Tests: Recent Labs  Lab 06/27/23 1640 06/28/23 0754 06/29/23 0300  AST 23 20 25   ALT 19 19 16   ALKPHOS 112 101 81  BILITOT 1.8* 2.3* 0.3  PROT 6.8 6.4* 5.4*  ALBUMIN 3.4* 3.0* 2.5*   Recent Labs  Lab 06/28/23 0754  LIPASE 78*   No results for input(s): "AMMONIA" in the last 168 hours. CBC: Recent Labs  Lab 06/27/23 1640 06/28/23 0754 06/29/23 0300  WBC 7.6 7.6 5.4  NEUTROABS 5.2 4.1  --   HGB 12.0 11.4* 9.3*  HCT 35.9* 35.4* 29.1*  MCV 93.7 97.0 95.7  PLT 410* 391 328   Cardiac Enzymes: No results for input(s): "CKTOTAL", "CKMB", "CKMBINDEX", "TROPONINI" in the last 168 hours. BNP: Invalid input(s): "POCBNP" CBG: Recent Labs  Lab 06/29/23 0423 06/29/23 0531 06/29/23 0625 06/29/23 0731 06/29/23 0839  GLUCAP 150* 198* 162* 151* 153*   D-Dimer No results for input(s): "DDIMER" in the last 72 hours. Hgb A1c No results for input(s): "HGBA1C" in the last 72 hours. Lipid Profile No results for input(s): "CHOL", "HDL", "LDLCALC", "TRIG", "CHOLHDL", "LDLDIRECT" in the last 72 hours. Thyroid function studies No results for input(s): "TSH", "T4TOTAL", "T3FREE", "THYROIDAB" in the last 72 hours.  Invalid input(s): "FREET3" Anemia work up No results for input(s): "VITAMINB12", "FOLATE", "FERRITIN", "TIBC", "IRON", "RETICCTPCT" in the last 72 hours. Urinalysis    Component Value Date/Time   COLORURINE YELLOW 06/27/2023 1834  APPEARANCEUR HAZY (A) 06/27/2023  1834   LABSPEC 1.014 06/27/2023 1834   PHURINE 5.0 06/27/2023 1834   GLUCOSEU >=500 (A) 06/27/2023 1834   HGBUR SMALL (A) 06/27/2023 1834   BILIRUBINUR NEGATIVE 06/27/2023 1834   KETONESUR 80 (A) 06/27/2023 1834   PROTEINUR >=300 (A) 06/27/2023 1834   NITRITE NEGATIVE 06/27/2023 1834   LEUKOCYTESUR NEGATIVE 06/27/2023 1834   Sepsis Labs Recent Labs  Lab 06/27/23 1640 06/28/23 0754 06/29/23 0300  WBC 7.6 7.6 5.4   Microbiology No results found for this or any previous visit (from the past 240 hours).   Patient was seen and examined on the day of discharge and was found to be in stable condition. Time coordinating discharge: 40 minutes including assessment and coordination of care, as well as examination of the patient.   SIGNED:  Noralee Stain, DO [facility] 06/29/2023, 9:24 AM

## 2023-06-29 NOTE — Telephone Encounter (Signed)
Referral to [facility] for pelvic floor therapy placed in EPIC. They call the patient directly. Scheduled for anorectal manometry first available 11/18/23 at 8:30 am. Letter and instructions mailed to the patient.

## 2023-06-29 NOTE — Telephone Encounter (Signed)
-----   Message from Mike Gip sent at 06/28/2023  2:31 PM EST ----- Regarding: office follow up etc Patient recently seen by Dr. Lavon Paganini for screening colonoscopy  Now admitted with DKA, has been having problems with fecal urgency and incontinence progressive over the past 4 to 5 months I think this is secondary to diabetic neuropathy  , We are doing some symptom control while in the hospital with fiber and glycopyrrolate She will need to be scheduled for anorectal manometry, and also needs an appointment to see pelvic floor physical therapy if you could start working on those things it would be much appreciated  Also please get her a follow-up appointment with Dr. Lavon Paganini  in the office -thanks!

## 2023-06-30 LAB — URINE CULTURE: Culture: <10000 — AB

## 2023-07-02 ENCOUNTER — Other Ambulatory Visit: Payer: Self-pay

## 2023-07-02 ENCOUNTER — Telehealth: Payer: Self-pay

## 2023-07-02 DIAGNOSIS — R152 Fecal urgency: Secondary | ICD-10-CM

## 2023-07-02 NOTE — Telephone Encounter (Signed)
 Connie Vaughn

## 2023-07-02 NOTE — Telephone Encounter (Signed)
-----   Message from Glendia FORBES Holt sent at 06/30/2023  3:44 PM EST ----- Regarding: Outpatient follow up [facility],  I saw this patient at [facility] Sunday, admitted with DKA.  You did her screening colonoscopy earlier this month.  She has symptoms of frequent bowel movements with urgency and incontinence, with prominent nocturnal incontinence requiring diapers.  Stools are not watery, typically semiformed.  She has poorly controlled Type 1 diabetes. I had recommended she undergo anorectal manometry and pelvic floor physical therapy in addition to fiber supplementation and PRN loperamide  and then follow up with you in the office.  Rock,  Can you please place an order for anorectal manometry and pelvic floor physical therapy? Thanks

## 2023-07-02 NOTE — Telephone Encounter (Signed)
 Pt is already scheduled for anorectal mano, amb ref in epic for pelvic floor PT.
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
## 2023-07-09 ENCOUNTER — Encounter: Payer: Self-pay | Admitting: Gastroenterology

## 2023-07-09 ENCOUNTER — Other Ambulatory Visit: Payer: Self-pay

## 2023-07-09 ENCOUNTER — Ambulatory Visit: Payer: Managed Care, Other (non HMO) | Attending: Gastroenterology | Admitting: Physical Therapy

## 2023-07-09 ENCOUNTER — Encounter: Payer: Self-pay | Admitting: Physical Therapy

## 2023-07-09 DIAGNOSIS — R159 Full incontinence of feces: Secondary | ICD-10-CM | POA: Diagnosis not present

## 2023-07-09 DIAGNOSIS — R293 Abnormal posture: Secondary | ICD-10-CM | POA: Insufficient documentation

## 2023-07-09 DIAGNOSIS — R152 Fecal urgency: Secondary | ICD-10-CM | POA: Diagnosis not present

## 2023-07-09 DIAGNOSIS — M6281 Muscle weakness (generalized): Secondary | ICD-10-CM | POA: Diagnosis present

## 2023-07-09 DIAGNOSIS — R102 Pelvic and perineal pain: Secondary | ICD-10-CM | POA: Insufficient documentation

## 2023-07-09 DIAGNOSIS — R269 Unspecified abnormalities of gait and mobility: Secondary | ICD-10-CM | POA: Insufficient documentation

## 2023-07-13 ENCOUNTER — Ambulatory Visit: Payer: Managed Care, Other (non HMO) | Admitting: Physical Therapy

## 2023-07-13 ENCOUNTER — Encounter: Payer: Self-pay | Admitting: Physical Therapy

## 2023-07-13 DIAGNOSIS — M6281 Muscle weakness (generalized): Secondary | ICD-10-CM

## 2023-07-13 DIAGNOSIS — R102 Pelvic and perineal pain: Secondary | ICD-10-CM

## 2023-07-13 DIAGNOSIS — R293 Abnormal posture: Secondary | ICD-10-CM

## 2023-07-13 DIAGNOSIS — R269 Unspecified abnormalities of gait and mobility: Secondary | ICD-10-CM
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
## 2023-07-15 ENCOUNTER — Telehealth: Payer: Self-pay | Admitting: Gastroenterology

## 2023-07-15 NOTE — Telephone Encounter (Signed)
 PT would like to discuss the details about her scheduled ano rectal manometry. Please advise.

## 2023-07-16 NOTE — Telephone Encounter (Signed)
Inbound call from patient requesting a call to discuss rescheduling 5/21 procedure. States she would like a sooner appointment. Please advise, thank you.

## 2023-07-16 NOTE — Telephone Encounter (Signed)
Spoke with the patient. Moved her anorectal manometry to 07/29/23 at 8:30. She is instructed to arrive at 8:00 am to the admitting office at [facility].

## 2023-07-20 ENCOUNTER — Encounter: Payer: Self-pay | Admitting: Physical Therapy

## 2023-07-20 ENCOUNTER — Other Ambulatory Visit: Payer: Self-pay

## 2023-07-20 ENCOUNTER — Inpatient Hospital Stay (HOSPITAL_COMMUNITY)
Admission: EM | Admit: 2023-07-20 | Discharge: 2023-07-21 | DRG: 639 | Disposition: A | Discharge status: Home / Self Care | Payer: Managed Care, Other (non HMO) | Attending: Internal Medicine | Admitting: Internal Medicine

## 2023-07-20 ENCOUNTER — Encounter (HOSPITAL_COMMUNITY): Payer: Self-pay

## 2023-07-20 DIAGNOSIS — K529 Noninfective gastroenteritis and colitis, unspecified: Secondary | ICD-10-CM | POA: Diagnosis present

## 2023-07-20 DIAGNOSIS — K5900 Constipation, unspecified: Secondary | ICD-10-CM | POA: Diagnosis present

## 2023-07-20 DIAGNOSIS — Z79899 Other long term (current) drug therapy: Secondary | ICD-10-CM | POA: Diagnosis not present

## 2023-07-20 DIAGNOSIS — Z8249 Family history of ischemic heart disease and other diseases of the circulatory system: Secondary | ICD-10-CM | POA: Diagnosis not present

## 2023-07-20 DIAGNOSIS — F17210 Nicotine dependence, cigarettes, uncomplicated: Secondary | ICD-10-CM | POA: Diagnosis present

## 2023-07-20 DIAGNOSIS — E1010 Type 1 diabetes mellitus with ketoacidosis without coma: Secondary | ICD-10-CM | POA: Diagnosis present

## 2023-07-20 DIAGNOSIS — E785 Hyperlipidemia, unspecified: Secondary | ICD-10-CM | POA: Diagnosis present

## 2023-07-20 DIAGNOSIS — E860 Dehydration: Secondary | ICD-10-CM | POA: Diagnosis present

## 2023-07-20 DIAGNOSIS — I10 Essential (primary) hypertension: Secondary | ICD-10-CM | POA: Diagnosis present

## 2023-07-20 DIAGNOSIS — Z882 Allergy status to sulfonamides status: Secondary | ICD-10-CM | POA: Diagnosis not present

## 2023-07-20 DIAGNOSIS — F909 Attention-deficit hyperactivity disorder, unspecified type: Secondary | ICD-10-CM | POA: Diagnosis present

## 2023-07-20 DIAGNOSIS — Z1152 Encounter for screening for COVID-19: Secondary | ICD-10-CM | POA: Diagnosis not present

## 2023-07-20 DIAGNOSIS — Z794 Long term (current) use of insulin: Secondary | ICD-10-CM | POA: Diagnosis not present

## 2023-07-20 DIAGNOSIS — E876 Hypokalemia: Secondary | ICD-10-CM | POA: Diagnosis present

## 2023-07-20 DIAGNOSIS — Z833 Family history of diabetes mellitus: Secondary | ICD-10-CM | POA: Diagnosis not present

## 2023-07-20 DIAGNOSIS — E1110 Type 2 diabetes mellitus with ketoacidosis without coma: Secondary | ICD-10-CM | POA: Diagnosis present

## 2023-07-20 LAB — URINALYSIS, ROUTINE W REFLEX MICROSCOPIC
Bacteria, UA: NONE SEEN
Bilirubin Urine: NEGATIVE
Glucose, UA: >=500 mg/dL — AB
Ketones, ur: 80 mg/dL — AB
Leukocytes,Ua: NEGATIVE
Nitrite: NEGATIVE
Protein, ur: 100 mg/dL — AB
Specific Gravity, Urine: 1.021 (ref 1.005–1.030)
pH: 5 (ref 5.0–8.0)

## 2023-07-20 LAB — CBC WITH DIFFERENTIAL/PLATELET
Abs Immature Granulocytes: 0.03 10*3/uL (ref 0.00–0.07)
Basophils Absolute: 0.1 10*3/uL (ref 0.0–0.1)
Basophils Relative: 2 %
Eosinophils Absolute: 0.1 10*3/uL (ref 0.0–0.5)
Eosinophils Relative: 2 %
HCT: 41.4 % (ref 36.0–46.0)
Hemoglobin: 13.1 g/dL (ref 12.0–15.0)
Immature Granulocytes: 0 %
Lymphocytes Relative: 30 %
Lymphs Abs: 2.5 10*3/uL (ref 0.7–4.0)
MCH: 29.6 pg (ref 26.0–34.0)
MCHC: 31.6 g/dL (ref 30.0–36.0)
MCV: 93.5 fL (ref 80.0–100.0)
Monocytes Absolute: 0.4 10*3/uL (ref 0.1–1.0)
Monocytes Relative: 5 %
Neutro Abs: 5.1 10*3/uL (ref 1.7–7.7)
Neutrophils Relative %: 61 %
Platelets: 463 10*3/uL — ABNORMAL HIGH (ref 150–400)
RBC: 4.43 MIL/uL (ref 3.87–5.11)
RDW: 13.9 % (ref 11.5–15.5)
WBC: 8.3 10*3/uL (ref 4.0–10.5)
nRBC: 0 % (ref 0.0–0.2)

## 2023-07-20 LAB — BASIC METABOLIC PANEL
Anion gap: >20 — ABNORMAL HIGH (ref 5–15)
Anion gap: 14 (ref 5–15)
Anion gap: 12 (ref 5–15)
BUN: 27 mg/dL — ABNORMAL HIGH (ref 6–20)
BUN: 23 mg/dL — ABNORMAL HIGH (ref 6–20)
BUN: 13 mg/dL (ref 6–20)
CO2: 14 mmol/L — ABNORMAL LOW (ref 22–32)
CO2: 17 mmol/L — ABNORMAL LOW (ref 22–32)
CO2: 22 mmol/L (ref 22–32)
Calcium: 9 mg/dL (ref 8.9–10.3)
Calcium: 8.3 mg/dL — ABNORMAL LOW (ref 8.9–10.3)
Calcium: 8.8 mg/dL — ABNORMAL LOW (ref 8.9–10.3)
Chloride: 96 mmol/L — ABNORMAL LOW (ref 98–111)
Chloride: 105 mmol/L (ref 98–111)
Chloride: 101 mmol/L (ref 98–111)
Creatinine, Ser: 1.19 mg/dL — ABNORMAL HIGH (ref 0.44–1.00)
Creatinine, Ser: 0.95 mg/dL (ref 0.44–1.00)
Creatinine, Ser: 0.52 mg/dL (ref 0.44–1.00)
GFR, Estimated: 57 mL/min — ABNORMAL LOW (ref >60)
GFR, Estimated: >60 mL/min (ref >60)
GFR, Estimated: >60 mL/min (ref >60)
Glucose, Bld: 425 mg/dL — ABNORMAL HIGH (ref 70–99)
Glucose, Bld: 166 mg/dL — ABNORMAL HIGH (ref 70–99)
Glucose, Bld: 142 mg/dL — ABNORMAL HIGH (ref 70–99)
Potassium: 3.4 mmol/L — ABNORMAL LOW (ref 3.5–5.1)
Potassium: 3.8 mmol/L (ref 3.5–5.1)
Potassium: 4.2 mmol/L (ref 3.5–5.1)
Sodium: 133 mmol/L — ABNORMAL LOW (ref 135–145)
Sodium: 136 mmol/L (ref 135–145)
Sodium: 135 mmol/L (ref 135–145)

## 2023-07-20 LAB — RESP PANEL BY RT-PCR (RSV, FLU A&B, COVID)  RVPGX2
Influenza A by PCR: NEGATIVE
Influenza B by PCR: NEGATIVE
Resp Syncytial Virus by PCR: NEGATIVE
SARS Coronavirus 2 by RT PCR: NEGATIVE

## 2023-07-20 LAB — CBG MONITORING, ED
Glucose-Capillary: 476 mg/dL — ABNORMAL HIGH (ref 70–99)
Glucose-Capillary: 138 mg/dL — ABNORMAL HIGH (ref 70–99)
Glucose-Capillary: 157 mg/dL — ABNORMAL HIGH (ref 70–99)

## 2023-07-20 LAB — GLUCOSE, CAPILLARY
Glucose-Capillary: 149 mg/dL — ABNORMAL HIGH (ref 70–99)
Glucose-Capillary: 104 mg/dL — ABNORMAL HIGH (ref 70–99)
Glucose-Capillary: 164 mg/dL — ABNORMAL HIGH (ref 70–99)
Glucose-Capillary: 132 mg/dL — ABNORMAL HIGH (ref 70–99)
Glucose-Capillary: 115 mg/dL — ABNORMAL HIGH (ref 70–99)
Glucose-Capillary: 136 mg/dL — ABNORMAL HIGH (ref 70–99)
Glucose-Capillary: 140 mg/dL — ABNORMAL HIGH (ref 70–99)
Glucose-Capillary: 110 mg/dL — ABNORMAL HIGH (ref 70–99)

## 2023-07-20 LAB — BETA-HYDROXYBUTYRIC ACID
Beta-Hydroxybutyric Acid: 6.55 mmol/L — ABNORMAL HIGH (ref 0.05–0.27)
Beta-Hydroxybutyric Acid: 1.12 mmol/L — ABNORMAL HIGH (ref 0.05–0.27)

## 2023-07-20 LAB — I-STAT CHEM 8, ED
BUN: 29 mg/dL — ABNORMAL HIGH (ref 6–20)
Calcium, Ion: 1.16 mmol/L (ref 1.15–1.40)
Chloride: 101 mmol/L (ref 98–111)
Creatinine, Ser: 0.9 mg/dL (ref 0.44–1.00)
Glucose, Bld: 433 mg/dL — ABNORMAL HIGH (ref 70–99)
HCT: 43 % (ref 36.0–46.0)
Hemoglobin: 14.6 g/dL (ref 12.0–15.0)
Potassium: 3.7 mmol/L (ref 3.5–5.1)
Sodium: 134 mmol/L — ABNORMAL LOW (ref 135–145)
TCO2: 15 mmol/L — ABNORMAL LOW (ref 22–32)

## 2023-07-20 LAB — BLOOD GAS, VENOUS
Acid-base deficit: 11.4 mmol/L — ABNORMAL HIGH (ref 0.0–2.0)
Bicarbonate: 15.6 mmol/L — ABNORMAL LOW (ref 20.0–28.0)
O2 Saturation: 50.1 %
Patient temperature: 37
pCO2, Ven: 38 mm[Hg] — ABNORMAL LOW (ref 44–60)
pH, Ven: 7.22 — ABNORMAL LOW (ref 7.25–7.43)
pO2, Ven: 31 mm[Hg] — CL (ref 32–45)

## 2023-07-20 LAB — HCG, SERUM, QUALITATIVE: Preg, Serum: NEGATIVE

## 2023-07-20 LAB — I-STAT CG4 LACTIC ACID, ED: Lactic Acid, Venous: 5.4 mmol/L (ref 0.5–1.9)

## 2023-07-20 LAB — LACTIC ACID, PLASMA: Lactic Acid, Venous: 1.8 mmol/L (ref 0.5–1.9)

## 2023-07-20 LAB — MRSA NEXT GEN BY PCR, NASAL: MRSA by PCR Next Gen: NOT DETECTED

## 2023-07-20 MED ORDER — ENOXAPARIN SODIUM 40 MG/0.4ML IJ SOSY
40.0000 mg | PREFILLED_SYRINGE | INTRAMUSCULAR | Status: DC
  Administered 2023-07-20: 40 mg via SUBCUTANEOUS
  Filled 2023-07-20: qty 0.4

## 2023-07-20 MED ORDER — ACETAMINOPHEN 325 MG PO TABS: 650.0000 mg | ORAL_TABLET | Freq: Four times a day (QID) | ORAL | Status: DC | PRN

## 2023-07-20 MED ORDER — POTASSIUM CHLORIDE 10 MEQ/100ML IV SOLN
10.0000 meq | INTRAVENOUS | Status: AC
Start: 2023-07-20 — End: 2023-07-20
  Administered 2023-07-20 (×4): 10 meq via INTRAVENOUS
  Filled 2023-07-20 (×4): qty 100

## 2023-07-20 MED ORDER — LACTATED RINGERS IV BOLUS
2000.0000 mL | Freq: Once | INTRAVENOUS | Status: AC
  Administered 2023-07-20: 2000 mL via INTRAVENOUS

## 2023-07-20 MED ORDER — DEXTROSE 50 % IV SOLN: 0.0000 mL | INTRAVENOUS | Status: DC | PRN

## 2023-07-20 MED ORDER — ACETAMINOPHEN 650 MG RE SUPP: 650.0000 mg | Freq: Four times a day (QID) | RECTAL | Status: DC | PRN

## 2023-07-20 MED ORDER — DEXTROSE IN LACTATED RINGERS 5 % IV SOLN: INTRAVENOUS | Status: DC

## 2023-07-20 MED ORDER — PSYLLIUM 95 % PO PACK
1.0000 | PACK | Freq: Every day | ORAL | Status: DC
  Administered 2023-07-20 – 2023-07-21 (×2): 1 via ORAL
  Filled 2023-07-20 (×2): qty 1

## 2023-07-20 MED ORDER — INSULIN REGULAR(HUMAN) IN NACL 100-0.9 UT/100ML-% IV SOLN
INTRAVENOUS | Status: DC
  Administered 2023-07-20: 2.2 [IU]/h via INTRAVENOUS
  Filled 2023-07-20: qty 100

## 2023-07-20 MED ORDER — ALBUTEROL SULFATE (2.5 MG/3ML) 0.083% IN NEBU: 2.5000 mg | INHALATION_SOLUTION | RESPIRATORY_TRACT | Status: DC | PRN

## 2023-07-20 MED ORDER — ORAL CARE MOUTH RINSE: 15.0000 mL | OROMUCOSAL | Status: DC | PRN

## 2023-07-20 MED ORDER — CHLORHEXIDINE GLUCONATE CLOTH 2 % EX PADS
6.0000 | MEDICATED_PAD | Freq: Every day | CUTANEOUS | Status: DC
  Administered 2023-07-20: 6 via TOPICAL

## 2023-07-20 MED ORDER — LACTATED RINGERS IV SOLN: INTRAVENOUS | Status: DC

## 2023-07-20 NOTE — ED Triage Notes (Signed)
Patient to ED by POV with c/o hyperglycemia. She states she has noticed an increases in her CBG the last 3 days. She administered 10u of insulin this AM after getting a reading of high at home. She c/o SOB and  constipation x1.5 days LBM

## 2023-07-20 NOTE — ED Provider Notes (Addendum)
[place] EMERGENCY DEPARTMENT AT [facility] Provider Note   CSN: [id number] Arrival date & time: 07/20/23  1034     History  Chief Complaint  Patient presents with   Hyperglycemia    Connie Vaughn is a 48 y.o. female.  48 year old female with past medical history significant for diabetes presents today for concern of elevated blood sugar readings at home.  She states around 1:30 AM she noticed the blood sugar reading of "high".  She took 18 units of insulin at that time.  Did improved to about lower 200s but then this morning around 9 AM and increased to a reading of "high" again.  She states she constantly has these elevated readings despite watching her diet.  She states she does not understand what she needs to do to keep this under control.  Denies any fever, respiratory complaints.  The history is provided by the patient. No language interpreter was used.       Home Medications Prior to Admission medications   Medication Sig Start Date End Date Taking? Authorizing Provider  Cholecalciferol (VITAMIN D3) 1.25 MG (50000 UT) CAPS Take 1 capsule by mouth once a week. 07/13/23  Yes [provider]  EPINEPHrine 0.3 mg/0.3 mL IJ SOAJ injection SMARTSIG:injection IM As Directed PRN 07/12/23  Yes [provider]  Continuous Glucose Sensor (FREESTYLE LIBRE 3 SENSOR) MISC 1 each every 14 (fourteen) days.    [provider]  glucose blood test strip Use as instructed Patient taking differently: 1 each by Other route in the morning, at noon, and at bedtime. Use as instructed 11/18/17   [name], NP  insulin degludec (TRESIBA FLEXTOUCH) 100 UNIT/ML FlexTouch Pen Inject 10-15 Units into the skin at bedtime. 08/22/19   [provider]  Insulin Pen Needle (B-D UF III MINI PEN NEEDLES) 31G X 5 MM MISC Use as instructed Patient taking differently: 1 each by Other route in the morning, at noon, and at bedtime. Use as instructed 11/18/17    [name], NP  Lancets MISC 1 each by Does not apply route in the morning, at noon, and at bedtime. 07/09/13   [provider]  lisinopril (ZESTRIL) 20 MG tablet Take 1 tablet (20 mg total) by mouth daily. 06/29/23   Noralee Stain, DO  loperamide (IMODIUM) 2 MG capsule Take 1 capsule (2 mg total) by mouth as needed for diarrhea or loose stools (Can take 2 capsules before bedtime and take 1 capsule as needed during the day for diarrhea.). 06/29/23   Noralee Stain, DO  LYUMJEV KWIKPEN 100 UNIT/ML KwikPen Inject 10-12 Units into the skin 3 (three) times daily with meals. 04/03/21   [provider]  psyllium (HYDROCIL/METAMUCIL) 95 % PACK Take 1 packet by mouth daily. 06/29/23   Noralee Stain, DO      Allergies    Sulfa antibiotics    Review of Systems   Review of Systems  Constitutional:  Negative for chills and fever.  Respiratory:  Negative for shortness of breath.   Gastrointestinal:  Positive for nausea. Negative for abdominal pain.  Neurological:  Negative for light-headedness.  All other systems reviewed and are negative.   Physical Exam Updated Vital Signs BP 114/73 (BP Location: Left Arm)   Pulse (!) 116   Temp 97.7 F (36.5 C) (Oral)   Resp 19   SpO2 100%  Physical Exam Vitals and nursing note reviewed.  Constitutional:      General: She is not in acute  distress.    Appearance: Normal appearance. She is not ill-appearing.  HENT:     Head: Normocephalic and atraumatic.     Nose: Nose normal.  Eyes:     Conjunctiva/sclera: Conjunctivae normal.  Cardiovascular:     Rate and Rhythm: Regular rhythm. Tachycardia present.  Pulmonary:     Effort: Pulmonary effort is normal. No respiratory distress.  Abdominal:     General: There is no distension.     Palpations: Abdomen is soft.     Tenderness: There is no abdominal tenderness. There is no guarding.  Musculoskeletal:        General: No deformity. Normal range of motion.     Cervical back: Normal  range of motion.  Skin:    Findings: No rash.  Neurological:     Mental Status: She is alert.     ED Results / Procedures / Treatments   Labs (all labs ordered are listed, but only abnormal results are displayed) Labs Reviewed  CBG MONITORING, ED - Abnormal; Notable for the following components:      Result Value   Glucose-Capillary 476 (*)    All other components within normal limits  RESP PANEL BY RT-PCR (RSV, FLU A&B, COVID)  RVPGX2  HCG, SERUM, QUALITATIVE  BASIC METABOLIC PANEL  BASIC METABOLIC PANEL  BASIC METABOLIC PANEL  BASIC METABOLIC PANEL  BETA-HYDROXYBUTYRIC ACID  BETA-HYDROXYBUTYRIC ACID  CBC WITH DIFFERENTIAL/PLATELET  URINALYSIS, ROUTINE W REFLEX MICROSCOPIC  BLOOD GAS, VENOUS  CBG MONITORING, ED  I-STAT CG4 LACTIC ACID, ED  I-STAT CHEM 8, ED    EKG None  Radiology No results found.  Procedures .Critical Care  Performed by: Marita Kansas, PA-C Authorized by: Marita Kansas, PA-C   Critical care provider statement:    Critical care time (minutes):  40   Critical care was necessary to treat or prevent imminent or life-threatening deterioration of the following conditions:  Metabolic crisis (DKA)   Critical care was time spent personally by me on the following activities:  Development of treatment plan with patient or surrogate, discussions with consultants, evaluation of patient's response to treatment, examination of patient, ordering and review of laboratory studies, ordering and review of radiographic studies, ordering and performing treatments and interventions, pulse oximetry, re-evaluation of patient's condition and review of old charts   Care discussed with: admitting provider       Medications Ordered in ED Medications  lactated ringers bolus 2,000 mL (has no administration in time range)    ED Course/ Medical Decision Making/ A&P                                 Medical Decision Making Amount and/or Complexity of Data Reviewed Labs:  ordered.  Risk Prescription drug management. Decision regarding hospitalization.   Medical Decision Making / ED Course   This patient presents to the ED for concern of hyperglycemia, this involves an extensive number of treatment options, and is a complaint that carries with it a high risk of complications and morbidity.  The differential diagnosis includes DKA, hyperglycemia without DKA, HHS  MDM: 47 year old female with past medical history significant for type 1 diabetes presents today for concern of hyperglycemia.  Endorses nausea.  Is tachycardic.  Otherwise hemodynamically stable.  Will obtain labs evaluating for DKA, provide fluids and reevaluate.  CBG currently 476.  CBC without leukocytosis or anemia.  BMP with glucose of 425, anion gap greater than 20, CO2 14.  Lactic acidosis of 5.4.  Respiratory panel negative.  UA with ketones otherwise without evidence of UTI.  Discussed with hospitalist will evaluate patient for admission.  Lab Tests: -I ordered, reviewed, and interpreted labs.   The pertinent results include:   Labs Reviewed  CBG MONITORING, ED - Abnormal; Notable for the following components:      Result Value   Glucose-Capillary 476 (*)    All other components within normal limits  RESP PANEL BY RT-PCR (RSV, FLU A&B, COVID)  RVPGX2  HCG, SERUM, QUALITATIVE  BASIC METABOLIC PANEL  BASIC METABOLIC PANEL  BASIC METABOLIC PANEL  BASIC METABOLIC PANEL  BETA-HYDROXYBUTYRIC ACID  BETA-HYDROXYBUTYRIC ACID  CBC WITH DIFFERENTIAL/PLATELET  URINALYSIS, ROUTINE W REFLEX MICROSCOPIC  BLOOD GAS, VENOUS  CBG MONITORING, ED  I-STAT CG4 LACTIC ACID, ED  I-STAT CHEM 8, ED      EKG  EKG Interpretation Date/Time:    Ventricular Rate:    PR Interval:    QRS Duration:    QT Interval:    QTC Calculation:   R Axis:      Text Interpretation:            Medicines ordered and prescription drug management: Meds ordered this encounter  Medications   lactated  ringers bolus 2,000 mL    -I have reviewed the patients home medicines and have made adjustments as needed  Critical interventions Endo tool, DKA   Cardiac Monitoring: The patient was maintained on a cardiac monitor.  I personally viewed and interpreted the cardiac monitored which showed an underlying rhythm of: Sinus tach to normal sinus rhythm   Reevaluation: After the interventions noted above, I reevaluated the patient and found that they have :stayed the same  Co morbidities that complicate the patient evaluation  Past Medical History:  Diagnosis Date   ADHD (attention deficit hyperactivity disorder)    AKI (acute kidney injury) (HCC) 10/06/2020   Anemia    Blood transfusion without reported diagnosis    Constipation    Diabetes mellitus type 1, uncontrolled    DKA (diabetic ketoacidosis) (HCC) 10/06/2020   GERD (gastroesophageal reflux disease)    Hyperlipidemia    Incontinence, feces       Dispostion: Discussed with hospitalist will evaluate patient for admission   Final Clinical Impression(s) / ED Diagnoses Final diagnoses:  Diabetic ketoacidosis without coma associated with type 1 diabetes mellitus [facility])    Rx / DC Orders ED Discharge Orders     None         Marita Kansas, PA-C 07/20/23 1338    Marita Kansas, PA-C 07/20/23 1340    Gwyneth Sprout, MD 07/20/23 1527

## 2023-07-20 NOTE — H&P (Signed)
History and Physical  Connie Vaughn HYQ:[id number] DOB: [birth year] DOA: 07/20/2023  PCP: Jackelyn Poling, DO   Chief Complaint: Hyperglycemia  HPI: Connie Vaughn is a 48 y.o. female with medical history significant for fecal incontinence, diarrhea/constipation, type 1 diabetes being admitted to the hospital with diabetic ketoacidosis.  History is provided by the patient and her husband who is at the bedside, states that she continues to have intermittent watery diarrhea and stool incontinence.  She was seen by GI during a recent hospital admission, who felt that she has pelvic floor dysfunction and she is scheduled for manometry and further workup later this month.  States that she has been eating an appropriate diet, and compliant with her insulin dosing.  Yesterday, her glucometer was reading high, blood sugar improved to the 200s, but this morning again was reading high so she came to the ER for evaluation.  Lab work as detailed below significant for findings of DKA.  She was given 2 L LR bolus, and started on IV insulin drip.  Review of Systems: Please see HPI for pertinent positives and negatives. A complete 10 system review of systems are otherwise negative.  Past Medical History:  Diagnosis Date   ADHD (attention deficit hyperactivity disorder)    AKI (acute kidney injury) (HCC) 10/06/2020   Anemia    Blood transfusion without reported diagnosis    Constipation    Diabetes mellitus type 1, uncontrolled    DKA (diabetic ketoacidosis) (HCC) 10/06/2020   GERD (gastroesophageal reflux disease)    Hyperlipidemia    Incontinence, feces    Past Surgical History:  Procedure Laterality Date   I & D EXTREMITY Right 03/09/2021   Procedure: IRRIGATION AND DEBRIDEMENT RIGHT RING FINGER;  Surgeon: Betha Loa, MD;  Location: [facility] OR;  Service: Orthopedics;  Laterality: Right;   TUBAL LIGATION     Social History:  reports that she has been smoking cigarettes. She has a 0.5 pack-year smoking  history. She has never used smokeless tobacco. She reports current drug use. She reports that she does not drink alcohol.  Allergies  Allergen Reactions   Sulfa Antibiotics Hives    Family History  Problem Relation Age of Onset   Heart disease Mother    Congestive Heart Failure Mother    Congestive Heart Failure Sister    Diabetes Maternal Grandmother    Colon polyps Neg Hx    Crohn's disease Neg Hx    Esophageal cancer Neg Hx    Rectal cancer Neg Hx    Stomach cancer Neg Hx      Prior to Admission medications   Medication Sig Start Date End Date Taking? Authorizing Provider  Cholecalciferol (VITAMIN D3) 1.25 MG (50000 UT) CAPS Take 1 capsule by mouth once a week. 07/13/23  Yes [provider]  EPINEPHrine 0.3 mg/0.3 mL IJ SOAJ injection Inject 0.3 mg into the muscle as needed for anaphylaxis. 07/12/23  Yes [provider]  insulin degludec (TRESIBA FLEXTOUCH) 100 UNIT/ML FlexTouch Pen Inject 10 Units into the skin at bedtime. 08/22/19  Yes [provider]  LYUMJEV KWIKPEN 100 UNIT/ML KwikPen Inject 10-15 Units into the skin 3 (three) times daily with meals. 04/03/21  Yes [provider]  psyllium (HYDROCIL/METAMUCIL) 95 % PACK Take 1 packet by mouth daily. 06/29/23  Yes Noralee Stain, DO  glucose blood test strip Use as instructed Patient taking differently: 1 each by Other route in the morning, at noon, and at bedtime. Use as instructed 11/18/17   Meredeth Ide,  Shea Stakes, NP  Insulin Pen Needle (B-D UF III MINI PEN NEEDLES) 31G X 5 MM MISC Use as instructed Patient taking differently: 1 each by Other route in the morning, at noon, and at bedtime. Use as instructed 11/18/17   [name], NP  lisinopril (ZESTRIL) 20 MG tablet Take 1 tablet (20 mg total) by mouth daily. Patient not taking: Reported on 07/20/2023 06/29/23   Noralee Stain, DO    Physical Exam: BP (!) 164/95   Pulse (!) 101   Temp 97.7 F (36.5 C) (Oral)   Resp 18   SpO2 100%   General:  Alert, oriented, calm, in no acute distress, looks comfortable Eyes: EOMI, clear conjuctivae, white sclerea Neck: supple, no masses, trachea mildline  Cardiovascular: RRR, no murmurs or rubs, no peripheral edema  Respiratory: clear to auscultation bilaterally, no wheezes, no crackles  Abdomen: soft, nontender, nondistended, normal bowel tones heard  Skin: dry, no rashes  Musculoskeletal: no joint effusions, normal range of motion  Psychiatric: appropriate affect, normal speech  Neurologic: extraocular muscles intact, clear speech, moving all extremities with intact sensorium         Labs on Admission:  Basic Metabolic Panel: Recent Labs  Lab 07/20/23 1111 07/20/23 1148 07/20/23 1250  NA 133* 134* 136  K 3.4* 3.7 3.8  CL 96* 101 105  CO2 14*  --  17*  GLUCOSE 425* 433* 166*  BUN 27* 29* 23*  CREATININE 1.19* 0.90 0.95  CALCIUM 9.0  --  8.3*   Liver Function Tests: No results for input(s): "AST", "ALT", "ALKPHOS", "BILITOT", "PROT", "ALBUMIN" in the last 168 hours. No results for input(s): "LIPASE", "AMYLASE" in the last 168 hours. No results for input(s): "AMMONIA" in the last 168 hours. CBC: Recent Labs  Lab 07/20/23 1111 07/20/23 1148  WBC 8.3  --   NEUTROABS 5.1  --   HGB 13.1 14.6  HCT 41.4 43.0  MCV 93.5  --   PLT 463*  --    Cardiac Enzymes: No results for input(s): "CKTOTAL", "CKMB", "CKMBINDEX", "TROPONINI" in the last 168 hours. BNP (last 3 results) No results for input(s): "BNP" in the last 8760 hours.  ProBNP (last 3 results) No results for input(s): "PROBNP" in the last 8760 hours.  CBG: Recent Labs  Lab 07/20/23 1039 07/20/23 1240  GLUCAP 476* 138*   Radiological Exams on Admission: No results found.  Assessment/Plan Connie Vaughn is a 48 y.o. female with medical history significant for fecal incontinence, diarrhea/constipation, type 1 diabetes being admitted to the hospital with diabetic ketoacidosis.  DKA-with hyperglycemia,  anion gap metabolic acidosis, beta hydroxybutyrate of 6.55.  With IV fluids and initiation of insulin, she has had rapid improvement in her blood sugar and anion gap acidosis.  However still has metabolic acidosis.  I suspect because of DKA is dehydration related to her chronic diarrhea and incontinence. -Inpatient admission to stepdown -Continue IV fluids and insulin drip per Endotool -Recheck BMP and beta hydroxybutyrate, anticipate can be transitioned off of insulin drip soon  Lactic acidosis-due to significant dehydration, anticipate rapid improvement with hydration.  Will recheck lactate in 1 hour.  Hypokalemia-repleted with IV potassium in the ER, monitor potassium level with repeat BMP  Pseudohyponatremia-improved after resolution of severe hyperglycemia  Chronic diarrhea and incontinence-followed by GI Dr. Lavon Paganini, continue outpatient workup, scheduled for anorectal manometry 07/29/2023  DVT prophylaxis: Lovenox     Code Status: Full Code  Consults called: None  Admission status: The appropriate patient status for this  patient is INPATIENT. Inpatient status is judged to be reasonable and necessary in order to provide the required intensity of service to ensure the patient's safety. The patient's presenting symptoms, physical exam findings, and initial radiographic and laboratory data in the context of their chronic comorbidities is felt to place them at high risk for further clinical deterioration. Furthermore, it is not anticipated that the patient will be medically stable for discharge from the hospital within 2 midnights of admission.    I certify that at the point of admission it is my clinical judgment that the patient will require inpatient hospital care spanning beyond 2 midnights from the point of admission due to high intensity of service, high risk for further deterioration and high frequency of surveillance required  Time spent: 49 minutes  Aariv Medlock Sharlette Dense MD [facility] Pager [phone]  If 7PM-7AM, please contact night-coverage [email or web address] Password [email or web address]  07/20/2023, 1:40 PM

## 2023-07-20 NOTE — ED Notes (Signed)
Patient refuses EKG 

## 2023-07-21 DIAGNOSIS — E1010 Type 1 diabetes mellitus with ketoacidosis without coma: Secondary | ICD-10-CM | POA: Diagnosis not present

## 2023-07-21 LAB — BASIC METABOLIC PANEL
Anion gap: 10 (ref 5–15)
Anion gap: 9 (ref 5–15)
BUN: 11 mg/dL (ref 6–20)
BUN: 12 mg/dL (ref 6–20)
CO2: 22 mmol/L (ref 22–32)
CO2: 23 mmol/L (ref 22–32)
Calcium: 8 mg/dL — ABNORMAL LOW (ref 8.9–10.3)
Calcium: 7.9 mg/dL — ABNORMAL LOW (ref 8.9–10.3)
Chloride: 103 mmol/L (ref 98–111)
Chloride: 99 mmol/L (ref 98–111)
Creatinine, Ser: 0.57 mg/dL (ref 0.44–1.00)
Creatinine, Ser: 0.35 mg/dL — ABNORMAL LOW (ref 0.44–1.00)
GFR, Estimated: >60 mL/min (ref >60)
GFR, Estimated: >60 mL/min (ref >60)
Glucose, Bld: 217 mg/dL — ABNORMAL HIGH (ref 70–99)
Glucose, Bld: 297 mg/dL — ABNORMAL HIGH (ref 70–99)
Potassium: 3.5 mmol/L (ref 3.5–5.1)
Potassium: 3.7 mmol/L (ref 3.5–5.1)
Sodium: 135 mmol/L (ref 135–145)
Sodium: 131 mmol/L — ABNORMAL LOW (ref 135–145)

## 2023-07-21 LAB — CBC
HCT: 31.9 % — ABNORMAL LOW (ref 36.0–46.0)
Hemoglobin: 10.6 g/dL — ABNORMAL LOW (ref 12.0–15.0)
MCH: 29.7 pg (ref 26.0–34.0)
MCHC: 33.2 g/dL (ref 30.0–36.0)
MCV: 89.4 fL (ref 80.0–100.0)
Platelets: 381 10*3/uL (ref 150–400)
RBC: 3.57 MIL/uL — ABNORMAL LOW (ref 3.87–5.11)
RDW: 13.7 % (ref 11.5–15.5)
WBC: 5.8 10*3/uL (ref 4.0–10.5)
nRBC: 0 % (ref 0.0–0.2)

## 2023-07-21 LAB — GLUCOSE, CAPILLARY
Glucose-Capillary: 112 mg/dL — ABNORMAL HIGH (ref 70–99)
Glucose-Capillary: 113 mg/dL — ABNORMAL HIGH (ref 70–99)
Glucose-Capillary: 141 mg/dL — ABNORMAL HIGH (ref 70–99)
Glucose-Capillary: 170 mg/dL — ABNORMAL HIGH (ref 70–99)
Glucose-Capillary: 188 mg/dL — ABNORMAL HIGH (ref 70–99)
Glucose-Capillary: 198 mg/dL — ABNORMAL HIGH (ref 70–99)
Glucose-Capillary: 285 mg/dL — ABNORMAL HIGH (ref 70–99)
Glucose-Capillary: 89 mg/dL (ref 70–99)

## 2023-07-21 LAB — BETA-HYDROXYBUTYRIC ACID: Beta-Hydroxybutyric Acid: 0.79 mmol/L — ABNORMAL HIGH (ref 0.05–0.27)

## 2023-07-21 MED ORDER — LISINOPRIL 10 MG PO TABS
20.0000 mg | ORAL_TABLET | Freq: Every day | ORAL | Status: DC
Start: 2023-07-21 — End: 2023-07-21
  Administered 2023-07-21: 20 mg via ORAL
  Filled 2023-07-21: qty 2

## 2023-07-21 MED ORDER — SODIUM CHLORIDE 0.9 % IV SOLN: INTRAVENOUS | Status: AC

## 2023-07-21 MED ORDER — INSULIN GLARGINE-YFGN 100 UNIT/ML ~~LOC~~ SOLN
10.0000 [IU] | Freq: Every day | SUBCUTANEOUS | Status: DC
  Administered 2023-07-21: 10 [IU] via SUBCUTANEOUS
  Filled 2023-07-21: qty 0.1

## 2023-07-21 MED ORDER — INSULIN ASPART 100 UNIT/ML IJ SOLN: 0.0000 [IU] | Freq: Every day | INTRAMUSCULAR | Status: DC

## 2023-07-21 MED ORDER — INSULIN ASPART 100 UNIT/ML IJ SOLN
0.0000 [IU] | Freq: Three times a day (TID) | INTRAMUSCULAR | Status: DC
  Administered 2023-07-21: 3 [IU] via SUBCUTANEOUS
  Administered 2023-07-21: 2 [IU] via SUBCUTANEOUS

## 2023-07-21 MED ORDER — INSULIN GLARGINE-YFGN 100 UNIT/ML ~~LOC~~ SOLN
8.0000 [IU] | Freq: Every day | SUBCUTANEOUS | Status: DC
Start: 2023-07-21 — End: 2023-07-21
  Filled 2023-07-21: qty 0.08

## 2023-07-21 NOTE — Plan of Care (Signed)
  Problem: Health Behavior/Discharge Planning: Goal: Ability to manage health-related needs will improve Outcome: Progressing   

## 2023-07-21 NOTE — Progress Notes (Addendum)
Upon my arrival on shift, I was told in report that lab had been by to draw labs and after 4 missed attempts by two people, she requested a different lab person attempt blood draw, and also requested that the back of her hand not be ued, as that had been used previously and she felt it was very painful. Pt stated she was fine with any part of her arm at the wrist or above to be used. I called lab and and was told someone would be sent soon to draw the blood.   Later in the shift lab came about 3 am to draw BMP and pt allowed this. Lab come back about 4 am to draw an other BMP and patient refused, stating that these were being done too close together.   After endo tool notified to stop insulin gtt, and it was stopped, glucose reading was 89 with [facility] glucometer. At the same time, pt's libre monitor was showing glucose at 69. Pt's orders were NPO, but patient choose to have some graham crackers, and water due to concerns of her glucose reading. MD notified.

## 2023-07-21 NOTE — Discharge Summary (Signed)
Physician Discharge Summary  JODILYNN CARRUTH UJW:[id number] DOB: 1975-09-01 DOA: 07/20/2023  PCP: Jackelyn Poling, DO  Admit date: 07/20/2023 Discharge date: 07/21/2023  Admitted From: home Disposition:  home  Recommendations for Outpatient Follow-up:  Follow up with PCP in 1-2 weeks Follow up with GI in 8 days as scheduled   Home Health: none Equipment/Devices: none  Discharge Condition: stable CODE STATUS: Full code Diet Orders (From admission, onward)     Start     Ordered   07/21/23 0514  Diet Carb Modified Fluid consistency: Thin; Room service appropriate? Yes  Diet effective now       Question Answer Comment  Diet-HS Snack? Nothing   Calorie Level Medium 1600-2000   Fluid consistency: Thin   Room service appropriate? Yes      07/21/23 0515            HPI: Per admitting MD, Connie Vaughn is a 48 y.o. female with medical history significant for fecal incontinence, diarrhea/constipation, type 1 diabetes being admitted to the hospital with diabetic ketoacidosis.  History is provided by the patient and her husband who is at the bedside, states that she continues to have intermittent watery diarrhea and stool incontinence.  She was seen by GI during a recent hospital admission, who felt that she has pelvic floor dysfunction and she is scheduled for manometry and further workup later this month.  States that she has been eating an appropriate diet, and compliant with her insulin dosing.  Yesterday, her glucometer was reading high, blood sugar improved to the 200s, but this morning again was reading high so she came to the ER for evaluation.  Lab work as detailed below significant for findings of DKA.  She was given 2 L LR bolus, and started on IV insulin drip.   Hospital Course / Discharge diagnoses: Principal problem DKA in the setting of insulin-dependent diabetes mellitus, type I, with hyperglycemia -patient was admitted to the hospital for DKA in the setting of underlying  fecal incontinence, diarrhea/constipation and fluid losses.  She was placed on insulin infusion with improvement in her DKA, gap is closed, she was transitioned to subcutaneous regimen and she is tolerating it well, diet was advanced to regular and has no further issues, will be discharged home in stable condition  Active problems Fecal incontinence -longstanding problem, was recently hospitalized in December 2020 for gastroenterology evaluated patient.  She has been having increased stool frequency and fecal incontinence, and at home she was instructed to use Imodium.  She has an appointment with gastroenterology on 1/29 for an rectal manometry and pelvic floor physical therapy consultation.  At home, she took Imodium but felt too constipated, and then took over-the-counter laxatives with resultant watery diarrhea, which likely caused her to be dehydrated and going to DKA.  Discussed with patient to avoid laxative agents, and if she gets constipated from Imodium to try to use low-dose MiraLAX and suppositories.  She no longer has diarrhea here and is tolerating p.o. intake.  Further management per GI as an outpatient Lactic acidosis-due to significant dehydration Hypokalemia - repleted Pseudohyponatremia - improved after resolution of severe hyperglycemia Essential hypertension-resume home lisinopril  Sepsis ruled out   Discharge Instructions   Allergies as of 07/21/2023       Reactions   Sulfa Antibiotics Hives        Medication List     TAKE these medications    EPINEPHrine 0.3 mg/0.3 mL Soaj injection Commonly known as: EPI-PEN Inject 0.3  mg into the muscle as needed for anaphylaxis.   glucose blood test strip Use as instructed   Insulin Pen Needle 31G X 5 MM Misc Commonly known as: B-D UF III MINI PEN NEEDLES Use as instructed   lisinopril 20 MG tablet Commonly known as: ZESTRIL Take 1 tablet (20 mg total) by mouth daily.   Lyumjev KwikPen 100 UNIT/ML KwikPen Generic  drug: Insulin Lispro-aabc Inject 10-15 Units into the skin 3 (three) times daily with meals.   psyllium 95 % Pack Commonly known as: HYDROCIL/METAMUCIL Take 1 packet by mouth daily.   [name] FlexTouch 100 UNIT/ML FlexTouch Pen Generic drug: insulin degludec Inject 10 Units into the skin at bedtime.   Vitamin D3 1.25 MG (50000 UT) Caps Take 1 capsule by mouth once a week.       Consultations: None  Procedures/Studies:  No results found.   Subjective: - no chest pain, shortness of breath, no abdominal pain, nausea or vomiting.   Discharge Exam: BP (!) 167/81   Pulse 94   Temp 98.8 F (37.1 C) (Oral)   Resp 19   Ht 5\' 4"  (1.626 m)   Wt 59.1 kg   SpO2 99%   BMI 22.36 kg/m   General: Pt is alert, awake, not in acute distress Cardiovascular: RRR, S1/S2 +, no rubs, no gallops Respiratory: CTA bilaterally, no wheezing, no rhonchi Abdominal: Soft, NT, ND, bowel sounds + Extremities: no edema, no cyanosis    The results of significant diagnostics from this hospitalization (including imaging, microbiology, ancillary and laboratory) are listed below for reference.     Microbiology: Recent Results (from the past 240 hours)  Resp panel by RT-PCR (RSV, Flu A&B, Covid) Anterior Nasal Swab     Status: None   Collection Time: 07/20/23 11:21 AM   Specimen: Anterior Nasal Swab  Result Value Ref Range Status   SARS Coronavirus 2 by RT PCR NEGATIVE NEGATIVE Final    Comment: (NOTE) SARS-CoV-2 target nucleic acids are NOT DETECTED.  The SARS-CoV-2 RNA is generally detectable in upper respiratory specimens during the acute phase of infection. The lowest concentration of SARS-CoV-2 viral copies this assay can detect is 138 copies/mL. A negative result does not preclude SARS-Cov-2 infection and should not be used as the sole basis for treatment or other patient management decisions. A negative result may occur with  improper specimen collection/handling, submission of  specimen other than nasopharyngeal swab, presence of viral mutation(s) within the areas targeted by this assay, and inadequate number of viral copies(<138 copies/mL). A negative result must be combined with clinical observations, patient history, and epidemiological information. The expected result is Negative.  Fact Sheet for Patients:  BloggerCourse.com  Fact Sheet for Healthcare Providers:  SeriousBroker.it  This test is no t yet approved or cleared by the [place] FDA and  has been authorized for detection and/or diagnosis of SARS-CoV-2 by FDA under an Emergency Use Authorization (EUA). This EUA will remain  in effect (meaning this test can be used) for the duration of the COVID-19 declaration under Section 564(b)(1) of the Act, 21 U.S.C.section 360bbb-3(b)(1), unless the authorization is terminated  or revoked sooner.       Influenza A by PCR NEGATIVE NEGATIVE Final   Influenza B by PCR NEGATIVE NEGATIVE Final    Comment: (NOTE) The Xpert Xpress SARS-CoV-2/FLU/RSV plus assay is intended as an aid in the diagnosis of influenza from Nasopharyngeal swab specimens and should not be used as a sole basis for treatment. Nasal washings and aspirates  are unacceptable for Xpert Xpress SARS-CoV-2/FLU/RSV testing.  Fact Sheet for Patients: BloggerCourse.com  Fact Sheet for Healthcare Providers: SeriousBroker.it  This test is not yet approved or cleared by the [place] FDA and has been authorized for detection and/or diagnosis of SARS-CoV-2 by FDA under an Emergency Use Authorization (EUA). This EUA will remain in effect (meaning this test can be used) for the duration of the COVID-19 declaration under Section 564(b)(1) of the Act, 21 U.S.C. section 360bbb-3(b)(1), unless the authorization is terminated or revoked.     Resp Syncytial Virus by PCR NEGATIVE NEGATIVE Final     Comment: (NOTE) Fact Sheet for Patients: BloggerCourse.com  Fact Sheet for Healthcare Providers: SeriousBroker.it  This test is not yet approved or cleared by the [place] FDA and has been authorized for detection and/or diagnosis of SARS-CoV-2 by FDA under an Emergency Use Authorization (EUA). This EUA will remain in effect (meaning this test can be used) for the duration of the COVID-19 declaration under Section 564(b)(1) of the Act, 21 U.S.C. section 360bbb-3(b)(1), unless the authorization is terminated or revoked.  Performed at [facility], [address] [place], [place] [address]   MRSA Next Gen by PCR, Nasal     Status: None   Collection Time: 07/20/23  2:26 PM   Specimen: Nasal Mucosa; Nasal Swab  Result Value Ref Range Status   MRSA by PCR Next Gen NOT DETECTED NOT DETECTED Final    Comment: (NOTE) The GeneXpert MRSA Assay (FDA approved for NASAL specimens only), is one component of a comprehensive MRSA colonization surveillance program. It is not intended to diagnose MRSA infection nor to guide or monitor treatment for MRSA infections. Test performance is not FDA approved in patients less than 45 years old. Performed at [facility], [address] [place], [place] [address]      Labs: Basic Metabolic Panel: Recent Labs  Lab 07/20/23 1111 07/20/23 1148 07/20/23 1250 07/20/23 2139 07/21/23 0252 07/21/23 0628  NA 133* 134* 136 135 135 131*  K 3.4* 3.7 3.8 4.2 3.5 3.7  CL 96* 101 105 101 103 99  CO2 14*  --  17* 22 22 23   GLUCOSE 425* 433* 166* 142* 217* 297*  BUN 27* 29* 23* 13 11 12   CREATININE 1.19* 0.90 0.95 0.52 0.57 0.35*  CALCIUM 9.0  --  8.3* 8.8* 8.0* 7.9*   Liver Function Tests: No results for input(s): "AST", "ALT", "ALKPHOS", "BILITOT", "PROT", "ALBUMIN" in the last 168 hours. CBC: Recent Labs  Lab 07/20/23 1111 07/20/23 1148 07/21/23 0628   WBC 8.3  --  5.8  NEUTROABS 5.1  --   --   HGB 13.1 14.6 10.6*  HCT 41.4 43.0 31.9*  MCV 93.5  --  89.4  PLT 463*  --  381   CBG: Recent Labs  Lab 07/21/23 0426 07/21/23 0451 07/21/23 0654 07/21/23 0758 07/21/23 1143  GLUCAP 112* 89 285* 170* 141*   Hgb A1c No results for input(s): "HGBA1C" in the last 72 hours. Lipid Profile No results for input(s): "CHOL", "HDL", "LDLCALC", "TRIG", "CHOLHDL", "LDLDIRECT" in the last 72 hours. Thyroid function studies No results for input(s): "TSH", "T4TOTAL", "T3FREE", "THYROIDAB" in the last 72 hours.  Invalid input(s): "FREET3" Urinalysis    Component Value Date/Time   COLORURINE STRAW (A) 07/20/2023 1116   APPEARANCEUR CLEAR 07/20/2023 1116   LABSPEC 1.021 07/20/2023 1116   PHURINE 5.0 07/20/2023 1116   GLUCOSEU >=500 (A) 07/20/2023 1116   HGBUR SMALL (A) 07/20/2023  1116   BILIRUBINUR NEGATIVE 07/20/2023 1116   KETONESUR 80 (A) 07/20/2023 1116   PROTEINUR 100 (A) 07/20/2023 1116   NITRITE NEGATIVE 07/20/2023 1116   LEUKOCYTESUR NEGATIVE 07/20/2023 1116    FURTHER DISCHARGE INSTRUCTIONS:   Get Medicines reviewed and adjusted: Please take all your medications with you for your next visit with your Primary MD   Laboratory/radiological data: Please request your Primary MD to go over all hospital tests and procedure/radiological results at the follow up, please ask your Primary MD to get all Hospital records sent to his/her office.   In some cases, they will be blood work, cultures and biopsy results pending at the time of your discharge. Please request that your primary care M.D. goes through all the records of your hospital data and follows up on these results.   Also Note the following: If you experience worsening of your admission symptoms, develop shortness of breath, life threatening emergency, suicidal or homicidal thoughts you must seek medical attention immediately by calling 911 or calling your MD immediately  if  symptoms less severe.   You must read complete instructions/literature along with all the possible adverse reactions/side effects for all the Medicines you take and that have been prescribed to you. Take any new Medicines after you have completely understood and accpet all the possible adverse reactions/side effects.    Do not drive when taking Pain medications or sleeping medications (Benzodaizepines)   Do not take more than prescribed Pain, Sleep and Anxiety Medications. It is not advisable to combine anxiety,sleep and pain medications without talking with your primary care practitioner   Special Instructions: If you have smoked or chewed Tobacco  in the last 2 yrs please stop smoking, stop any regular Alcohol  and or any Recreational drug use.   Wear Seat belts while driving.   Please note: You were cared for by a hospitalist during your hospital stay. Once you are discharged, your primary care physician will handle any further medical issues. Please note that NO REFILLS for any discharge medications will be authorized once you are discharged, as it is imperative that you return to your primary care physician (or establish a relationship with a primary care physician if you do not have one) for your post hospital discharge needs so that they can reassess your need for medications and monitor your lab values.  Time coordinating discharge: 35 minutes  SIGNED:  Pamella Pert, MD, PhD 07/21/2023, 1:27 PM

## 2023-07-21 NOTE — Progress Notes (Signed)
AVS gone over with pt. All questions answered. Pt refused a wheelechair and walked to the exit with the NT. Pt said that he husband is waiting downstairs to take her home.

## 2023-07-27 ENCOUNTER — Ambulatory Visit: Payer: Managed Care, Other (non HMO) | Admitting: Physical Therapy

## 2023-07-27 ENCOUNTER — Encounter: Payer: Self-pay | Admitting: Physical Therapy

## 2023-07-29 ENCOUNTER — Encounter (HOSPITAL_COMMUNITY)
Admission: RE | Disposition: A | Discharge status: Home / Self Care | Payer: Self-pay | Source: Home / Self Care | Attending: Gastroenterology

## 2023-07-29 ENCOUNTER — Encounter (HOSPITAL_COMMUNITY): Payer: Self-pay | Admitting: Gastroenterology

## 2023-07-29 ENCOUNTER — Ambulatory Visit (HOSPITAL_COMMUNITY)
Admission: RE | Admit: 2023-07-29 | Discharge: 2023-07-29 | Disposition: A | Discharge status: Home / Self Care | Payer: Managed Care, Other (non HMO) | Attending: Gastroenterology | Admitting: Gastroenterology

## 2023-07-29 DIAGNOSIS — R152 Fecal urgency: Secondary | ICD-10-CM | POA: Diagnosis present

## 2023-07-29 DIAGNOSIS — K5902 Outlet dysfunction constipation: Secondary | ICD-10-CM | POA: Diagnosis not present

## 2023-07-29 DIAGNOSIS — R159 Full incontinence of feces: Secondary | ICD-10-CM | POA: Diagnosis not present

## 2023-07-29 HISTORY — PX: ANAL RECTAL MANOMETRY: SHX6358

## 2023-07-29 SURGERY — MANOMETRY, ANORECTAL
Anesthesia: Choice

## 2023-07-29 NOTE — Progress Notes (Signed)
Anal rectal manometry performed per protocol. Balloon on probe unable to be inflated after multiple tries with repositioning probe x 2 nurses.  Patient tolerated well.  Balloon expulsion test performed per protocol without complications.  Patient tolerated well.  Unable to expel balloon after 3 minutes.

## 2023-07-30 ENCOUNTER — Encounter (HOSPITAL_COMMUNITY): Payer: Self-pay | Admitting: Gastroenterology

## 2023-08-03 ENCOUNTER — Encounter: Payer: Self-pay | Admitting: Physical Therapy

## 2023-08-05 ENCOUNTER — Telehealth: Payer: Self-pay

## 2023-08-05 ENCOUNTER — Other Ambulatory Visit: Payer: Self-pay

## 2023-08-05 DIAGNOSIS — R152 Fecal urgency: Secondary | ICD-10-CM

## 2023-08-05 DIAGNOSIS — K5902 Outlet dysfunction constipation: Secondary | ICD-10-CM

## 2023-08-05 NOTE — Telephone Encounter (Signed)
 Called the patient. No answer. Left a message of my call and requested a call back.  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  Pelvic floor PT with biofeed back is recommended.

## 2023-08-05 NOTE — Telephone Encounter (Signed)
 Patient called back. I was unavailable. Called the patient back within 20 minutes. No answer. Left information on the voicemail. Will refer her to [facility]. They will contact her directly to schedule the first appointment.

## 2023-08-05 NOTE — Telephone Encounter (Signed)
 Patient is returning your call.

## 2023-08-06 ENCOUNTER — Ambulatory Visit: Payer: Managed Care, Other (non HMO) | Attending: Gastroenterology | Admitting: Physical Therapy

## 2023-08-06 ENCOUNTER — Encounter: Payer: Self-pay | Admitting: Physical Therapy

## 2023-08-06 DIAGNOSIS — R102 Pelvic and perineal pain: Secondary | ICD-10-CM | POA: Insufficient documentation

## 2023-08-06 DIAGNOSIS — R269 Unspecified abnormalities of gait and mobility: Secondary | ICD-10-CM | POA: Diagnosis present

## 2023-08-06 DIAGNOSIS — M6281 Muscle weakness (generalized): Secondary | ICD-10-CM | POA: Diagnosis present

## 2023-08-06 DIAGNOSIS — R293 Abnormal posture: Secondary | ICD-10-CM | POA: Diagnosis present
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
## 2023-08-06 NOTE — Telephone Encounter (Signed)
 Patient contacted. Agrees to the referral for pelvic flooring PT with biofeed back.

## 2023-08-10 ENCOUNTER — Ambulatory Visit: Payer: Self-pay | Admitting: Physical Therapy

## 2023-08-10 ENCOUNTER — Encounter: Payer: Self-pay | Admitting: Physical Therapy

## 2023-08-10 DIAGNOSIS — R102 Pelvic and perineal pain: Secondary | ICD-10-CM | POA: Diagnosis not present

## 2023-08-10 DIAGNOSIS — R293 Abnormal posture: Secondary | ICD-10-CM

## 2023-08-10 DIAGNOSIS — R269 Unspecified abnormalities of gait and mobility: Secondary | ICD-10-CM

## 2023-08-10 DIAGNOSIS — M6281 Muscle weakness (generalized): Secondary | ICD-10-CM
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
## 2023-08-18 ENCOUNTER — Encounter: Payer: Self-pay | Admitting: Physical Therapy

## 2023-08-18 ENCOUNTER — Ambulatory Visit: Payer: Managed Care, Other (non HMO) | Admitting: Physical Therapy

## 2023-08-18 DIAGNOSIS — M6281 Muscle weakness (generalized): Secondary | ICD-10-CM

## 2023-08-18 DIAGNOSIS — R269 Unspecified abnormalities of gait and mobility: Secondary | ICD-10-CM

## 2023-08-18 DIAGNOSIS — R102 Pelvic and perineal pain: Secondary | ICD-10-CM | POA: Diagnosis not present

## 2023-08-18 DIAGNOSIS — R293 Abnormal posture: Secondary | ICD-10-CM

## 2023-08-18 NOTE — Patient Instructions (Signed)
 Fiber Table -- Grams of Fiber in Food  For additional information on fiber content in foods, go to www.caloriecounts.com  Food Products Serving Size Grams of Fiber/serving  Breads    Whole Wheat 1 slice 2.11  White 1 slice 0.5  Rye 1 slice 1.72  Cereals    Oat Bran 1 oz. 4.06  Wheat Bran 1 oz. 10.0  All Bran  cup 6.0  Optimum 1 cup 10.0  Whole Wheat Total 1 cup 3.0  Fiber One   cup 13.0  Shredded Wheat 1oz. 2.64  Corn Flakes 1 oz. 0.45  Cheerio's 1 1/3 cup 2.0  Oatmeal 1 oz. 2.5  Rice    Brown  cup 5.27  White  cup 1.42  Spaghetti 2 oz. 2.56  Vegetables (cooked)    Broccoli  cup 2.58  Brussels sprouts  cup 2.0  Cauliflower  cup 2.6  Carrots  cup 3.2  Corn  cup 3.03  Eggplant  cup 0.96  Green peas  cup 3.36  Lettuce (raw)  cup 0.24  Baked potato w/skin  cup 2.97  Spinach  cup 2.07  Squash  cup 2.87  Tomato (raw)  cup 1.17  Zucchini  cup 1.26  Beans    Green (canned)  cup 1.89  Kidney  cup 5.48  Lima  cup 4.25  Pinto  cup 5.93  Fresh fruits    Apple (with peel) 1 medium 2.76  Apricots 1 cup 3.13  Banana  1 medium 2.19  Black/Boysenberries 1 cup 7.2  Grapefruit 1 medium 3.61  Grapes 1 cup 1.12  Nectarine 1 medium 2.2  Orange 1 medium 3.14  Pear (with peel) 1 medium 4.32  Prunes 3 3.5  Raspberries 1 cup 7.5  Strawberries 1 cup 3.87  Watermelon 1 slice 1.93
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
## 2023-08-27 ENCOUNTER — Ambulatory Visit: Payer: Managed Care, Other (non HMO) | Admitting: Physical Therapy

## 2023-08-27 ENCOUNTER — Encounter: Payer: Self-pay | Admitting: Physical Therapy

## 2023-08-27 DIAGNOSIS — R269 Unspecified abnormalities of gait and mobility: Secondary | ICD-10-CM

## 2023-08-27 DIAGNOSIS — R293 Abnormal posture: Secondary | ICD-10-CM

## 2023-08-27 DIAGNOSIS — R102 Pelvic and perineal pain unspecified side: Secondary | ICD-10-CM

## 2023-08-27 DIAGNOSIS — M6281 Muscle weakness (generalized): Secondary | ICD-10-CM
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
## 2023-09-04 ENCOUNTER — Telehealth: Payer: Self-pay | Admitting: Physical Therapy

## 2023-09-04 ENCOUNTER — Ambulatory Visit: Payer: Self-pay | Admitting: Physical Therapy

## 2023-09-04 NOTE — Telephone Encounter (Signed)
 Called pt re missed appt She had bathroom issues

## 2023-09-14 ENCOUNTER — Ambulatory Visit: Payer: Managed Care, Other (non HMO) | Admitting: Physical Therapy

## 2023-09-21 ENCOUNTER — Telehealth: Payer: Self-pay | Admitting: Physical Therapy

## 2023-09-21 ENCOUNTER — Ambulatory Visit: Payer: Managed Care, Other (non HMO) | Attending: Gastroenterology | Admitting: Physical Therapy

## 2023-09-21 DIAGNOSIS — M6281 Muscle weakness (generalized): Secondary | ICD-10-CM | POA: Insufficient documentation

## 2023-09-21 DIAGNOSIS — R269 Unspecified abnormalities of gait and mobility: Secondary | ICD-10-CM | POA: Insufficient documentation

## 2023-09-21 DIAGNOSIS — R102 Pelvic and perineal pain: Secondary | ICD-10-CM | POA: Insufficient documentation

## 2023-09-21 DIAGNOSIS — R293 Abnormal posture: Secondary | ICD-10-CM | POA: Insufficient documentation

## 2023-09-21 NOTE — Telephone Encounter (Signed)
 Spoke with pt re missed appt today. She said that sh does not feel well in the mornings d/t high sugar. Will cancel her 2 upcoming appts per her suggestion. Will call us in the future if needed.

## 2023-09-28 ENCOUNTER — Encounter: Payer: Managed Care, Other (non HMO) | Admitting: Physical Therapy

## 2023-10-05 ENCOUNTER — Encounter: Payer: Managed Care, Other (non HMO) | Admitting: Physical Therapy

## 2023-10-12 ENCOUNTER — Other Ambulatory Visit: Payer: Self-pay

## 2023-10-12 ENCOUNTER — Emergency Department (HOSPITAL_COMMUNITY)

## 2023-10-12 ENCOUNTER — Encounter (HOSPITAL_COMMUNITY): Payer: Self-pay

## 2023-10-12 ENCOUNTER — Inpatient Hospital Stay (HOSPITAL_COMMUNITY)
Admission: EM | Admit: 2023-10-12 | Discharge: 2023-10-15 | DRG: 637 | Disposition: A | Discharge status: Home / Self Care | Attending: Internal Medicine | Admitting: Internal Medicine

## 2023-10-12 DIAGNOSIS — E785 Hyperlipidemia, unspecified: Secondary | ICD-10-CM | POA: Diagnosis present

## 2023-10-12 DIAGNOSIS — E860 Dehydration: Secondary | ICD-10-CM | POA: Diagnosis present

## 2023-10-12 DIAGNOSIS — R1013 Epigastric pain: Secondary | ICD-10-CM

## 2023-10-12 DIAGNOSIS — F17210 Nicotine dependence, cigarettes, uncomplicated: Secondary | ICD-10-CM | POA: Diagnosis present

## 2023-10-12 DIAGNOSIS — R109 Unspecified abdominal pain: Secondary | ICD-10-CM | POA: Diagnosis present

## 2023-10-12 DIAGNOSIS — E1010 Type 1 diabetes mellitus with ketoacidosis without coma: Principal | ICD-10-CM | POA: Diagnosis present

## 2023-10-12 DIAGNOSIS — K859 Acute pancreatitis without necrosis or infection, unspecified: Secondary | ICD-10-CM | POA: Diagnosis present

## 2023-10-12 DIAGNOSIS — N179 Acute kidney failure, unspecified: Secondary | ICD-10-CM | POA: Diagnosis not present

## 2023-10-12 DIAGNOSIS — K219 Gastro-esophageal reflux disease without esophagitis: Secondary | ICD-10-CM | POA: Diagnosis present

## 2023-10-12 DIAGNOSIS — Z8249 Family history of ischemic heart disease and other diseases of the circulatory system: Secondary | ICD-10-CM

## 2023-10-12 DIAGNOSIS — Z833 Family history of diabetes mellitus: Secondary | ICD-10-CM

## 2023-10-12 DIAGNOSIS — F909 Attention-deficit hyperactivity disorder, unspecified type: Secondary | ICD-10-CM | POA: Diagnosis present

## 2023-10-12 DIAGNOSIS — Z66 Do not resuscitate: Secondary | ICD-10-CM | POA: Diagnosis present

## 2023-10-12 DIAGNOSIS — Z794 Long term (current) use of insulin: Secondary | ICD-10-CM

## 2023-10-12 DIAGNOSIS — Z79899 Other long term (current) drug therapy: Secondary | ICD-10-CM

## 2023-10-12 DIAGNOSIS — E872 Acidosis, unspecified: Secondary | ICD-10-CM | POA: Diagnosis present

## 2023-10-12 LAB — CBC WITH DIFFERENTIAL/PLATELET
Abs Immature Granulocytes: 0.16 10*3/uL — ABNORMAL HIGH (ref 0.00–0.07)
Basophils Absolute: 0.1 10*3/uL (ref 0.0–0.1)
Basophils Relative: 1 %
Eosinophils Absolute: 0 10*3/uL (ref 0.0–0.5)
Eosinophils Relative: 0 %
HCT: 38.3 % (ref 36.0–46.0)
Hemoglobin: 11.4 g/dL — ABNORMAL LOW (ref 12.0–15.0)
Immature Granulocytes: 1 %
Lymphocytes Relative: 20 %
Lymphs Abs: 2.4 10*3/uL (ref 0.7–4.0)
MCH: 29 pg (ref 26.0–34.0)
MCHC: 29.8 g/dL — ABNORMAL LOW (ref 30.0–36.0)
MCV: 97.5 fL (ref 80.0–100.0)
Monocytes Absolute: 0.6 10*3/uL (ref 0.1–1.0)
Monocytes Relative: 5 %
Neutro Abs: 8.8 10*3/uL — ABNORMAL HIGH (ref 1.7–7.7)
Neutrophils Relative %: 73 %
Platelets: 359 10*3/uL (ref 150–400)
RBC: 3.93 MIL/uL (ref 3.87–5.11)
RDW: 15.4 % (ref 11.5–15.5)
WBC: 12 10*3/uL — ABNORMAL HIGH (ref 4.0–10.5)
nRBC: 0 % (ref 0.0–0.2)

## 2023-10-12 LAB — MAGNESIUM: Magnesium: 2 mg/dL (ref 1.7–2.4)

## 2023-10-12 LAB — URINALYSIS, COMPLETE (UACMP) WITH MICROSCOPIC
Bilirubin Urine: NEGATIVE
Glucose, UA: >=500 mg/dL — AB
Ketones, ur: 80 mg/dL — AB
Leukocytes,Ua: NEGATIVE
Nitrite: NEGATIVE
Protein, ur: 100 mg/dL — AB
Specific Gravity, Urine: 1.014 (ref 1.005–1.030)
pH: 5 (ref 5.0–8.0)

## 2023-10-12 LAB — RAPID URINE DRUG SCREEN, HOSP PERFORMED
Amphetamines: NOT DETECTED
Barbiturates: NOT DETECTED
Benzodiazepines: NOT DETECTED
Cocaine: NOT DETECTED
Opiates: NOT DETECTED
Tetrahydrocannabinol: NOT DETECTED

## 2023-10-12 LAB — RESP PANEL BY RT-PCR (RSV, FLU A&B, COVID)  RVPGX2
Influenza A by PCR: NEGATIVE
Influenza B by PCR: NEGATIVE
Resp Syncytial Virus by PCR: NEGATIVE
SARS Coronavirus 2 by RT PCR: NEGATIVE

## 2023-10-12 LAB — BLOOD GAS, VENOUS
Acid-base deficit: 25.7 mmol/L — ABNORMAL HIGH (ref 0.0–2.0)
Acid-base deficit: 25.5 mmol/L — ABNORMAL HIGH (ref 0.0–2.0)
Acid-base deficit: 19.5 mmol/L — ABNORMAL HIGH (ref 0.0–2.0)
Bicarbonate: 3.4 mmol/L — ABNORMAL LOW (ref 20.0–28.0)
Bicarbonate: 3.8 mmol/L — ABNORMAL LOW (ref 20.0–28.0)
Bicarbonate: 6.1 mmol/L — ABNORMAL LOW (ref 20.0–28.0)
O2 Saturation: 93 %
O2 Saturation: 92.7 %
O2 Saturation: 94.1 %
Patient temperature: 37
Patient temperature: 37
Patient temperature: 37
pCO2, Ven: <18 mmHg — CL (ref 44–60)
pCO2, Ven: <18 mmHg — CL (ref 44–60)
pCO2, Ven: <18 mmHg — CL (ref 44–60)
pH, Ven: 7.02 — CL (ref 7.25–7.43)
pH, Ven: 7.01 — CL (ref 7.25–7.43)
pH, Ven: 7.22 — ABNORMAL LOW (ref 7.25–7.43)
pO2, Ven: 78 mmHg — ABNORMAL HIGH (ref 32–45)
pO2, Ven: 74 mmHg — ABNORMAL HIGH (ref 32–45)
pO2, Ven: 67 mmHg — ABNORMAL HIGH (ref 32–45)

## 2023-10-12 LAB — BASIC METABOLIC PANEL WITH GFR
Anion gap: 19 — ABNORMAL HIGH (ref 5–15)
BUN: 26 mg/dL — ABNORMAL HIGH (ref 6–20)
BUN: 24 mg/dL — ABNORMAL HIGH (ref 6–20)
CO2: <7 mmol/L — ABNORMAL LOW (ref 22–32)
CO2: 10 mmol/L — ABNORMAL LOW (ref 22–32)
Calcium: 8.4 mg/dL — ABNORMAL LOW (ref 8.9–10.3)
Calcium: 8.3 mg/dL — ABNORMAL LOW (ref 8.9–10.3)
Chloride: 108 mmol/L (ref 98–111)
Chloride: 108 mmol/L (ref 98–111)
Creatinine, Ser: 1.41 mg/dL — ABNORMAL HIGH (ref 0.44–1.00)
Creatinine, Ser: 1.18 mg/dL — ABNORMAL HIGH (ref 0.44–1.00)
GFR, Estimated: 46 mL/min — ABNORMAL LOW (ref >60)
GFR, Estimated: 57 mL/min — ABNORMAL LOW (ref >60)
Glucose, Bld: 188 mg/dL — ABNORMAL HIGH (ref 70–99)
Glucose, Bld: 161 mg/dL — ABNORMAL HIGH (ref 70–99)
Potassium: 4.2 mmol/L (ref 3.5–5.1)
Potassium: 4.6 mmol/L (ref 3.5–5.1)
Sodium: 138 mmol/L (ref 135–145)
Sodium: 137 mmol/L (ref 135–145)

## 2023-10-12 LAB — LACTIC ACID, PLASMA
Lactic Acid, Venous: 5.6 mmol/L (ref 0.5–1.9)
Lactic Acid, Venous: 3.3 mmol/L (ref 0.5–1.9)

## 2023-10-12 LAB — COMPREHENSIVE METABOLIC PANEL WITH GFR
ALT: 19 U/L (ref 0–44)
AST: 22 U/L (ref 15–41)
Albumin: 3.1 g/dL — ABNORMAL LOW (ref 3.5–5.0)
Alkaline Phosphatase: 106 U/L (ref 38–126)
BUN: 31 mg/dL — ABNORMAL HIGH (ref 6–20)
CO2: <7 mmol/L — ABNORMAL LOW (ref 22–32)
Calcium: 8.8 mg/dL — ABNORMAL LOW (ref 8.9–10.3)
Chloride: 97 mmol/L — ABNORMAL LOW (ref 98–111)
Creatinine, Ser: 1.62 mg/dL — ABNORMAL HIGH (ref 0.44–1.00)
GFR, Estimated: 39 mL/min — ABNORMAL LOW (ref >60)
Glucose, Bld: 728 mg/dL (ref 70–99)
Potassium: 5.7 mmol/L — ABNORMAL HIGH (ref 3.5–5.1)
Sodium: 132 mmol/L — ABNORMAL LOW (ref 135–145)
Total Bilirubin: 3 mg/dL — ABNORMAL HIGH (ref 0.0–1.2)
Total Protein: 6.6 g/dL (ref 6.5–8.1)

## 2023-10-12 LAB — BETA-HYDROXYBUTYRIC ACID
Beta-Hydroxybutyric Acid: >8 mmol/L — ABNORMAL HIGH (ref 0.05–0.27)
Beta-Hydroxybutyric Acid: 6.53 mmol/L — ABNORMAL HIGH (ref 0.05–0.27)

## 2023-10-12 LAB — TROPONIN I (HIGH SENSITIVITY)
Troponin I (High Sensitivity): 6 ng/L (ref <18)
Troponin I (High Sensitivity): 8 ng/L (ref <18)
Troponin I (High Sensitivity): 11 ng/L (ref <18)

## 2023-10-12 LAB — RETICULOCYTES
Immature Retic Fract: 13.3 % (ref 2.3–15.9)
RBC.: 3.38 MIL/uL — ABNORMAL LOW (ref 3.87–5.11)
Retic Count, Absolute: 42.9 10*3/uL (ref 19.0–186.0)
Retic Ct Pct: 1.3 % (ref 0.4–3.1)

## 2023-10-12 LAB — I-STAT CG4 LACTIC ACID, ED
Lactic Acid, Venous: 1.4 mmol/L (ref 0.5–1.9)
Lactic Acid, Venous: 2.3 mmol/L (ref 0.5–1.9)

## 2023-10-12 LAB — HCG, SERUM, QUALITATIVE: Preg, Serum: NEGATIVE

## 2023-10-12 LAB — LIPASE, BLOOD: Lipase: 139 U/L — ABNORMAL HIGH (ref 11–51)

## 2023-10-12 LAB — GLUCOSE, CAPILLARY
Glucose-Capillary: 146 mg/dL — ABNORMAL HIGH (ref 70–99)
Glucose-Capillary: 146 mg/dL — ABNORMAL HIGH (ref 70–99)
Glucose-Capillary: 155 mg/dL — ABNORMAL HIGH (ref 70–99)
Glucose-Capillary: 238 mg/dL — ABNORMAL HIGH (ref 70–99)
Glucose-Capillary: 267 mg/dL — ABNORMAL HIGH (ref 70–99)

## 2023-10-12 LAB — CBG MONITORING, ED
Glucose-Capillary: >600 mg/dL (ref 70–99)
Glucose-Capillary: 516 mg/dL (ref 70–99)
Glucose-Capillary: 397 mg/dL — ABNORMAL HIGH (ref 70–99)
Glucose-Capillary: 500 mg/dL — ABNORMAL HIGH (ref 70–99)
Glucose-Capillary: 230 mg/dL — ABNORMAL HIGH (ref 70–99)

## 2023-10-12 LAB — PROCALCITONIN: Procalcitonin: 0.29 ng/mL

## 2023-10-12 LAB — SODIUM, URINE, RANDOM: Sodium, Ur: 87 mmol/L

## 2023-10-12 LAB — PROTIME-INR
INR: 1 (ref 0.8–1.2)
Prothrombin Time: 13 s (ref 11.4–15.2)

## 2023-10-12 LAB — CREATININE, URINE, RANDOM: Creatinine, Urine: 22 mg/dL

## 2023-10-12 LAB — PHOSPHORUS: Phosphorus: 3.4 mg/dL (ref 2.5–4.6)

## 2023-10-12 LAB — CK
Total CK: 112 U/L (ref 38–234)
Total CK: 138 U/L (ref 38–234)

## 2023-10-12 LAB — HCG, QUANTITATIVE, PREGNANCY: hCG, Beta Chain, Quant, S: <1 m[IU]/mL (ref <5)

## 2023-10-12 MED ORDER — ORAL CARE MOUTH RINSE: 15.0000 mL | OROMUCOSAL | Status: DC | PRN

## 2023-10-12 MED ORDER — DEXTROSE IN LACTATED RINGERS 5 % IV SOLN: INTRAVENOUS | Status: DC

## 2023-10-12 MED ORDER — LACTATED RINGERS IV BOLUS
1000.0000 mL | Freq: Once | INTRAVENOUS | Status: AC
  Administered 2023-10-12: 1000 mL via INTRAVENOUS

## 2023-10-12 MED ORDER — INSULIN REGULAR(HUMAN) IN NACL 100-0.9 UT/100ML-% IV SOLN
INTRAVENOUS | Status: DC
Start: 2023-10-12 — End: 2023-10-13

## 2023-10-12 MED ORDER — INSULIN REGULAR(HUMAN) IN NACL 100-0.9 UT/100ML-% IV SOLN
INTRAVENOUS | Status: DC
  Administered 2023-10-12: 8.5 [IU]/h via INTRAVENOUS
  Administered 2023-10-12: 9 [IU]/h via INTRAVENOUS
  Filled 2023-10-12: qty 100

## 2023-10-12 MED ORDER — DEXTROSE 50 % IV SOLN: 0.0000 mL | INTRAVENOUS | Status: DC | PRN

## 2023-10-12 MED ORDER — HYDROMORPHONE HCL 1 MG/ML IJ SOLN
0.5000 mg | Freq: Once | INTRAMUSCULAR | Status: AC | PRN
  Administered 2023-10-12: 0.5 mg via INTRAVENOUS
  Filled 2023-10-12: qty 1

## 2023-10-12 MED ORDER — LACTATED RINGERS IV BOLUS
20.0000 mL/kg | Freq: Once | INTRAVENOUS | Status: AC
  Administered 2023-10-12: 1180 mL via INTRAVENOUS

## 2023-10-12 MED ORDER — FENTANYL CITRATE PF 50 MCG/ML IJ SOSY
25.0000 ug | PREFILLED_SYRINGE | INTRAMUSCULAR | Status: DC | PRN
  Administered 2023-10-12 – 2023-10-13 (×2): 25 ug via INTRAVENOUS
  Filled 2023-10-12 (×2): qty 1

## 2023-10-12 MED ORDER — LACTATED RINGERS IV SOLN: INTRAVENOUS | Status: AC

## 2023-10-12 MED ORDER — HYDROMORPHONE HCL 1 MG/ML IJ SOLN
0.5000 mg | Freq: Once | INTRAMUSCULAR | Status: AC
  Administered 2023-10-12: 0.5 mg via INTRAVENOUS
  Filled 2023-10-12: qty 1

## 2023-10-12 MED ORDER — DEXTROSE-SODIUM CHLORIDE 5-0.45 % IV SOLN: INTRAVENOUS | Status: AC

## 2023-10-12 MED ORDER — METOCLOPRAMIDE HCL 5 MG/ML IJ SOLN
5.0000 mg | Freq: Once | INTRAMUSCULAR | Status: AC
  Administered 2023-10-12: 5 mg via INTRAVENOUS
  Filled 2023-10-12: qty 2

## 2023-10-12 MED ORDER — SODIUM CHLORIDE 0.9 % IV BOLUS
500.0000 mL | Freq: Once | INTRAVENOUS | Status: AC
  Administered 2023-10-12: 500 mL via INTRAVENOUS

## 2023-10-12 NOTE — ED Triage Notes (Signed)
 BIB by EMS from home, type 1 diabeties, call out due to sugar reading being high, given 18 units of insulin, still high, N/V  HR 120 BP 129/71 O2 100%  EMS  20g LFA 4mg  Zofran fluid

## 2023-10-12 NOTE — ED Notes (Signed)
 Pt states feeling very nausea again

## 2023-10-12 NOTE — ED Provider Notes (Signed)
 Physical Exam  BP (!) 91/43   Pulse (!) 112   Temp (!) 97.4 F (36.3 C)   Resp (!) 28   Ht 5\' 4"  (1.626 m)   Wt 59 kg   SpO2 100%   BMI 22.31 kg/m   Physical Exam Vitals and nursing note reviewed.  Constitutional:      General: She is not in acute distress.    Appearance: She is well-developed.     Comments: Asleep but arouses to name.  Falls asleep if not engaged.  HENT:     Head: Normocephalic and atraumatic.  Eyes:     Conjunctiva/sclera: Conjunctivae normal.  Cardiovascular:     Rate and Rhythm: Regular rhythm. Tachycardia present.     Heart sounds: No murmur heard. Pulmonary:     Effort: Pulmonary effort is normal. No respiratory distress.     Breath sounds: Normal breath sounds.  Abdominal:     Palpations: Abdomen is soft.     Tenderness: There is abdominal tenderness.     Comments: Epigastric tenderness.  Musculoskeletal:        General: No swelling.     Cervical back: Neck supple.  Skin:    General: Skin is warm and dry.     Capillary Refill: Capillary refill takes less than 2 seconds.  Psychiatric:        Mood and Affect: Mood normal.   Procedures  .Critical Care  Performed by: Peter Garter, PA Authorized by: Peter Garter, PA   Critical care provider statement:    Critical care time (minutes):  43   Critical care was necessary to treat or prevent imminent or life-threatening deterioration of the following conditions:  Endocrine crisis and dehydration   Critical care was time spent personally by me on the following activities:  Development of treatment plan with patient or surrogate, discussions with consultants, evaluation of patient's response to treatment, examination of patient, ordering and review of laboratory studies, ordering and review of radiographic studies, ordering and performing treatments and interventions, pulse oximetry, re-evaluation of patient's condition and review of old charts  ED Course / MDM   Clinical Course as of  10/12/23 1845  Mon Oct 12, 2023  1802 Consulted intensivist regarding the patient.  Recommended another liter bolus lactated Ringer's.  Admit to hospitalist. [CR]  4098 Consult with Dr. Adela Glimpse regarding the patient who agreed with admission and assume further treatment/care. [CR]    Clinical Course User Index [CR] Peter Garter, PA   Medical Decision Making Amount and/or Complexity of Data Reviewed Labs: ordered. Radiology: ordered.  Risk Prescription drug management. Decision regarding hospitalization.   Patient care handed off from PA-C.  At shift change..  See prior note for more full details.  In short, 48 year old female presents emergency department after elevated blood glucose about a week.  Per family were at bedside, became more confused on the drive over.  Has been compliant with insulin per patient/family member.  Patient asleep laying on her right side able to arouse by saying her name with appropriate response to questions but would fall asleep if unengaged.  Patient's workup today significant for DKA with initial blood glucoses in the 700s, bicarb of less than 7, not calculable anion gap with beta hydroxybutyric acid greater than 8.  Begun on IV fluid bolus infusion, insulin drip with dextrose.  Suspect the patient's symptoms likely secondary to DKA.  Consulted intensivist who deferred to hospitalist.  [facility] hospitalist who agreed with admission.  Treatment plan  discussed with patient and family and they knowledge understanding and were agreeable.  Patient stable upon admission.       [place], [place] 10/12/23 Deberah Falconer    Scarlette Currier, MD 10/13/23 1336

## 2023-10-12 NOTE — ED Notes (Signed)
 Attempted to get temperature unable to under arm and by mouth.

## 2023-10-12 NOTE — Assessment & Plan Note (Signed)
Will rehydrate and follow fluid status °

## 2023-10-12 NOTE — Subjective & Objective (Signed)
 Pt has strugled with brittle DM1 BG has been hard to control For the past 1 wk  Not on insulin pump  She is supposed to be on long lasting insulin  But has been off it for a month has been trying to control with SSI No fever no chills  Had some N/V no diarrhera  Reports body aches Severe abd pain

## 2023-10-12 NOTE — Assessment & Plan Note (Signed)
Not on statins 

## 2023-10-12 NOTE — ED Notes (Signed)
 Bair Hugger and blankets placed on patient

## 2023-10-12 NOTE — Assessment & Plan Note (Signed)
 will admit per DKA/ protocol, obtain serial BMET, start on glucosestabalizer, aggressive IVF.   Change IVF to D5 1/2Na after BG <250 .  So far work up of possible causes of DKA/HSS with CXR, ECG one set of cardiac enzymes, UA.    Most likely cause been noncompliance Monitor in Stepdown. Replace potassium as needed.      Consult diabetes coordinator

## 2023-10-12 NOTE — H&P (Signed)
 Connie Vaughn:[id number] DOB: May 15, 1976 DOA: 10/12/2023     PCP: Jackelyn Poling, DO   Outpatient Specialists:   Patient arrived to ER on 10/12/23 at 1345 Referred by Attending Therisa Doyne, MD   Patient coming from:    home Lives With family    Chief Complaint:   Chief Complaint  Patient presents with   Hyperglycemia    HPI: Connie Vaughn is a 48 y.o. female with medical history significant of  DM1, HLD    Presented with  abd pain , weakness Pt has strugled with brittle DM1 BG has been hard to control For the past 1 wk  Not on insulin pump  She is supposed to be on long lasting insulin  But has been off it for a month has been trying to control with SSI No fever no chills  Had some N/V no diarrhera  Reports body aches Severe abd pain Patient states that her prescribed long-lasting insulin makes her have leg cramps so she stopped taking she would like to try different types of long-lasting insulin.  Family is extremely interested in insulin pump  Lab Results  Component Value Date   SARSCOV2NAA NEGATIVE 07/20/2023   SARSCOV2NAA NEGATIVE 03/06/2023   SARSCOV2NAA NEGATIVE 03/09/2021   SARSCOV2NAA NEGATIVE 10/06/2020    Regarding pertinent Chronic problems:    Hyperlipidemia -  not on statins   Lipid Panel     Component Value Date/Time   CHOL 197 11/18/2017 0954   TRIG 104 11/18/2017 0954   HDL 63 11/18/2017 0954   CHOLHDL 3.1 11/18/2017 0954   LDLCALC 113 (H) 11/18/2017 0954   LABVLDL 21 11/18/2017 0954       DM 1 -  Lab Results  Component Value Date   HGBA1C 11.4 (H) 01/15/2023   on SSI insulin,      Chronic anemia - baseline hg Hemoglobin & Hematocrit  Recent Labs    07/20/23 1148 07/21/23 0628 10/12/23 1426  HGB 14.6 10.6* 11.4*      While in ER: Clinical Course as of 10/12/23 1839  Mon Oct 12, 2023  1802 Consulted intensivist regarding the patient.  Recommended another liter bolus lactated Ringer's.  Admit to hospitalist. [CR]   [id number] Consult with Dr. Adela Glimpse regarding the patient who agreed with admission and assume further treatment/care. [CR]    Clinical Course User Index [CR] Peter Garter, PA    Patient initially found to have temperature 95 started on Bair hugger  Lab Orders         Blood gas, venous         CBC with Differential         Urinalysis, Routine w reflex microscopic -Urine, Clean Catch         Beta-hydroxybutyric acid         hCG, quantitative, pregnancy         Lipase, blood         Rapid urine drug screen (hospital performed)         Comprehensive metabolic panel with GFR         hCG, serum, qualitative         Blood gas, venous (at [facility] and [facility])         Basic metabolic panel         CK         Blood gas, venous         CBG monitoring, ED  I-Stat CG4 Lactic Acid         I-stat chem 8, ED (not at [facility] or [facility])         I-stat chem 8, ED (not at [facility], [facility] or [facility])         CBG monitoring, ED         CBG monitoring, ED         CBG monitoring, ED         CBG monitoring, ED       CXR - ordered  CTabd/pelvis - ordered    Following Medications were ordered in ER: Medications  insulin regular, human (MYXREDLIN) 100 units/ 100 mL infusion (9 Units/hr Intravenous New Bag/Given 10/12/23 1641)  lactated ringers infusion (0 mLs Intravenous Stopped 10/12/23 1826)  dextrose 5 % in lactated ringers infusion ( Intravenous New Bag/Given 10/12/23 1826)  dextrose 50 % solution 0-50 mL (has no administration in time range)  lactated ringers bolus 1,180 mL (0 mLs Intravenous Stopped 10/12/23 1522)  HYDROmorphone (DILAUDID) injection 0.5 mg (0.5 mg Intravenous Given 10/12/23 1519)  metoCLOPramide (REGLAN) injection 5 mg (5 mg Intravenous Given 10/12/23 1516)  lactated ringers bolus 1,000 mL (0 mLs Intravenous Stopped 10/12/23 1611)  lactated ringers bolus 1,000 mL (1,000 mLs Intravenous New Bag/Given 10/12/23 1745)       ED Triage Vitals  Encounter Vitals Group     BP 10/12/23 1416 (!)  118/56     Systolic BP Percentile --      Diastolic BP Percentile --      Pulse Rate 10/12/23 1416 (!) 113     Resp 10/12/23 1416 (!) 24     Temp 10/12/23 1525 (!) 95.7 F (35.4 C)     Temp Source 10/12/23 1525 Rectal     SpO2 10/12/23 1416 100 %     Weight 10/12/23 1402 130 lb (59 kg)     Height 10/12/23 1402 5\' 4"  (1.626 m)     Head Circumference --      Peak Flow --      Pain Score 10/12/23 1401 10     Pain Loc --      Pain Education --      Exclude from Growth Chart --   ZOXW(96)@     _________________________________________ Significant initial  Findings: Abnormal Labs Reviewed  BLOOD GAS, VENOUS - Abnormal; Notable for the following components:      Result Value   pH, Ven 7.02 (*)    pCO2, Ven <18 (*)    pO2, Ven 78 (*)    Bicarbonate 3.4 (*)    Acid-base deficit 25.7 (*)    All other components within normal limits  CBC WITH DIFFERENTIAL/PLATELET - Abnormal; Notable for the following components:   WBC 12.0 (*)    Hemoglobin 11.4 (*)    MCHC 29.8 (*)    Neutro Abs 8.8 (*)    Abs Immature Granulocytes 0.16 (*)    All other components within normal limits  BETA-HYDROXYBUTYRIC ACID - Abnormal; Notable for the following components:   Beta-Hydroxybutyric Acid >8.00 (*)    All other components within normal limits  LIPASE, BLOOD - Abnormal; Notable for the following components:   Lipase 139 (*)    All other components within normal limits  COMPREHENSIVE METABOLIC PANEL WITH GFR - Abnormal; Notable for the following components:   Sodium 132 (*)    Potassium 5.7 (*)    Chloride 97 (*)    CO2 <7 (*)    Glucose,  Bld 728 (*)    BUN 31 (*)    Creatinine, Ser 1.62 (*)    Calcium 8.8 (*)    Albumin 3.1 (*)    Total Bilirubin 3.0 (*)    GFR, Estimated 39 (*)    All other components within normal limits  BLOOD GAS, VENOUS - Abnormal; Notable for the following components:   pH, Ven 7.01 (*)    pCO2, Ven <18 (*)    pO2, Ven 74 (*)    Bicarbonate 3.8 (*)    Acid-base  deficit 25.5 (*)    All other components within normal limits  CBG MONITORING, ED - Abnormal; Notable for the following components:   Glucose-Capillary >600 (*)    All other components within normal limits  I-STAT CG4 LACTIC ACID, ED - Abnormal; Notable for the following components:   Lactic Acid, Venous 2.3 (*)    All other components within normal limits  CBG MONITORING, ED - Abnormal; Notable for the following components:   Glucose-Capillary 516 (*)    All other components within normal limits  CBG MONITORING, ED - Abnormal; Notable for the following components:   Glucose-Capillary 500 (*)    All other components within normal limits  CBG MONITORING, ED - Abnormal; Notable for the following components:   Glucose-Capillary 397 (*)    All other components within normal limits  CBG MONITORING, ED - Abnormal; Notable for the following components:   Glucose-Capillary 230 (*)    All other components within normal limits    ECG: Ordered Personally reviewed and interpreted by me showing: HR : 115 Rhythm: Sinus tachycardia Borderline intraventricular conduction delay RSR' in V1 or V2, right VCD or RVH Borderline prolonged QT interval No significant change since last tracing QTC 493  BNP (last 3 results) No results for input(s): "BNP" in the last 8760 hours.   COVID-19 Labs  No results for input(s): "DDIMER", "FERRITIN", "LDH", "CRP" in the last 72 hours.  Lab Results  Component Value Date   SARSCOV2NAA NEGATIVE 07/20/2023   SARSCOV2NAA NEGATIVE 03/06/2023   SARSCOV2NAA NEGATIVE 03/09/2021   SARSCOV2NAA NEGATIVE 10/06/2020     The recent clinical data is shown below. Vitals:   10/12/23 1630 10/12/23 1645 10/12/23 1646 10/12/23 1700  BP: (!) 96/51 (!) 96/48  (!) 91/43  Pulse: (!) 113 (!) 113  (!) 112  Resp: 15 (!) 26  (!) 28  Temp:   (!) 97.4 F (36.3 C)   TempSrc:      SpO2: 100% 100%  100%  Weight:      Height:         WBC     Component Value Date/Time   WBC  12.0 (H) 10/12/2023 1426   LYMPHSABS 2.4 10/12/2023 1426   MONOABS 0.6 10/12/2023 1426   EOSABS 0.0 10/12/2023 1426   BASOSABS 0.1 10/12/2023 1426    Lactic Acid, Venous    Component Value Date/Time   LATICACIDVEN 2.3 (HH) 10/12/2023 1649     Procalcitonin   Ordered      UA   ordered    ____________________________________________________   Venous  Blood Gas result:  pH   7.01 Low Panic   Acid-base deficit 25.5 High  mmol/L   pCO2, Ven <18 Low Panic  mmHg  O2 Saturation 92.7 %  pO2, Ven 74 High  mmHg      Repeat:  7.22 Low  Acid-base deficit 19.5 High  mmol/L   pCO2, Ven <18 Low Panic  mmHg  O2 Saturation 94.1 %  pO2, Ven 67 High  mmHg      __________________________________________________________ Recent Labs  Lab 10/12/23 1426  NA 132*  K 5.7*  CO2 <7*  GLUCOSE 728*  BUN 31*  CREATININE 1.62*  CALCIUM 8.8*    Cr  Up from baseline see below Lab Results  Component Value Date   CREATININE 1.62 (H) 10/12/2023   CREATININE 0.35 (L) 07/21/2023   CREATININE 0.57 07/21/2023    Recent Labs  Lab 10/12/23 1426  AST 22  ALT 19  ALKPHOS 106  BILITOT 3.0*  PROT 6.6  ALBUMIN 3.1*   Lab Results  Component Value Date   CALCIUM 8.8 (L) 10/12/2023   PHOS 1.9 (L) 06/28/2023       Plt: Lab Results  Component Value Date   PLT 359 10/12/2023       Recent Labs  Lab 10/12/23 1426  WBC 12.0*  NEUTROABS 8.8*  HGB 11.4*  HCT 38.3  MCV 97.5  PLT 359     Down *Up from baseline see below    Component Value Date/Time   HGB 11.4 (L) 10/12/2023 1426   HCT 38.3 10/12/2023 1426   MCV 97.5 10/12/2023 1426     Recent Labs  Lab 10/12/23 1426  LIPASE 139*      _______________________________________________ Hospitalist was called for admission for DKA   The following Work up has been ordered so far:  Orders Placed This Encounter  Procedures   DG Chest Port 1 View   Blood gas, venous   CBC with Differential   Urinalysis, Routine w reflex  microscopic -Urine, Clean Catch   Beta-hydroxybutyric acid   hCG, quantitative, pregnancy   Lipase, blood   Rapid urine drug screen (hospital performed)   Comprehensive metabolic panel with GFR   hCG, serum, qualitative   Blood gas, venous (at [facility] and [facility])   Basic metabolic panel   CK   Blood gas, venous   Diet NPO time specified   ED Cardiac monitoring   Initiate Carrier Fluid Protocol   Notify physician (specify)   If present, discontinue Insulin Pump after IV Insulin is initiated.   Do NOT use lab glucose values in [employer].  If CBG meter reads "Critical High", enter 600.   Upon IV fluid bolus completion, place order for STAT BMET (LAB15) and call provider with results.   K+ > 5 mEq/L and/or K+ addressed separately   Check temperature   Cardiac Monitoring - Continuous Indefinite   Consult to intensivist   Consult to hospitalist   CBG monitoring, ED   I-Stat CG4 Lactic Acid   I-stat chem 8, ED (not at [facility], [facility] or [facility])   I-stat chem 8, ED (not at [facility], [facility] or [facility])   CBG monitoring, ED   CBG monitoring, ED   CBG monitoring, ED   CBG monitoring, ED   EKG 12-Lead   Insert peripheral IV   Place in observation (patient's expected length of stay will be less than 2 midnights)     OTHER Significant initial  Findings:  labs showing:     DM  labs:  HbA1C: Recent Labs    01/15/23 1705  HGBA1C 11.4*       CBG (last 3)  Recent Labs    10/12/23 1638 10/12/23 1713 10/12/23 1819  GLUCAP 500* 397* 230*          Cultures:    Component Value Date/Time   SDES  06/27/2023 1834    URINE, RANDOM Performed at [facility], [address], [place], [place] [address]    SPECREQUEST  06/27/2023 1834    NONE Performed at [facility], [address] [place], [place] [address]    CULT (A) 06/27/2023 1834    <10,000 COLONIES/mL INSIGNIFICANT GROWTH Performed at [facility], [address], [place], [place] [address]     REPTSTATUS 06/30/2023 FINAL 06/27/2023 1834     Radiological Exams on Admission: No results found. _______________________________________________________________________________________________________ Latest  Blood pressure (!) 91/43, pulse (!) 112, temperature (!) 97.4 F (36.3 C), resp. rate (!) 28, height 5\' 4"  (1.626 m), weight 59 kg, SpO2 100%.   Vitals  labs and radiology finding personally reviewed  Review of Systems:    Pertinent positives include:  fatigue, abdominal pain, nausea Constitutional:  No weight loss, night sweats, Fevers, chills,  weight loss  HEENT:  No headaches, Difficulty swallowing,Tooth/dental problems,Sore throat,  No sneezing, itching, ear ache, nasal congestion, post nasal drip,  Cardio-vascular:  No chest pain, Orthopnea, PND, anasarca, dizziness, palpitations.no Bilateral lower extremity swelling  GI:  No heartburn, indigestion, , vomiting, diarrhea, change in bowel habits, loss of appetite, melena, blood in stool, hematemesis Resp:  no shortness of breath at rest. No dyspnea on exertion, No excess mucus, no productive cough, No non-productive cough, No coughing up of blood.No change in color of mucus.No wheezing. Skin:  no rash or lesions. No jaundice GU:  no dysuria, change in color of urine, no urgency or frequency. No straining to urinate.  No flank pain.  Musculoskeletal:  No joint pain or no joint swelling. No decreased range of motion. No back pain.  Psych:  No change in mood or affect. No depression or anxiety. No memory loss.  Neuro: no localizing neurological complaints, no tingling, no weakness, no double vision, no gait abnormality, no slurred speech, no confusion  All systems reviewed and apart from HOPI all are negative _______________________________________________________________________________________________ Past Medical History:   Past Medical History:  Diagnosis Date   ADHD (attention deficit hyperactivity disorder)     AKI (acute kidney injury) (HCC) 10/06/2020   Anemia    Blood transfusion without reported diagnosis    Constipation    Diabetes mellitus type 1, uncontrolled    DKA (diabetic ketoacidosis) (HCC) 10/06/2020   GERD (gastroesophageal reflux disease)    Hyperlipidemia    Incontinence, feces      Past Surgical History:  Procedure Laterality Date   ANAL RECTAL MANOMETRY N/A 07/29/2023   Procedure: ANO RECTAL MANOMETRY;  Surgeon: Napoleon Form, MD;  Location: [facility];  Service: Gastroenterology;  Laterality: N/A;   I & D EXTREMITY Right 03/09/2021   Procedure: IRRIGATION AND DEBRIDEMENT RIGHT RING FINGER;  Surgeon: Betha Loa, MD;  Location: [facility] OR;  Service: Orthopedics;  Laterality: Right;   TUBAL LIGATION      Social History:  Ambulatory   independently     reports that she has been smoking cigarettes. She has a 0.5 pack-year smoking history. She has never used smokeless tobacco. She reports current drug use. She reports that she does not drink alcohol.   Family History:   Family History  Problem Relation Age of Onset   Heart disease Mother    Congestive Heart Failure Mother    Congestive Heart Failure Sister    Diabetes Maternal Grandmother    Colon polyps Neg Hx    Crohn's disease Neg Hx    Esophageal cancer Neg Hx    Rectal cancer Neg Hx  Stomach cancer Neg Hx    ______________________________________________________________________________________________ Allergies: Allergies  Allergen Reactions   Sulfa Antibiotics Hives     Prior to Admission medications   Medication Sig Start Date End Date Taking? Authorizing Provider  b complex vitamins capsule Take 1 capsule by mouth daily.   Yes [provider]  Cholecalciferol (VITAMIN D3) 1.25 MG (50000 UT) CAPS Take 1 capsule by mouth once a week. 07/13/23  Yes [provider]  EPINEPHrine 0.3 mg/0.3 mL IJ SOAJ injection Inject 0.3 mg into the muscle as needed for anaphylaxis. 07/12/23  Yes  [provider]  insulin degludec (TRESIBA FLEXTOUCH) 100 UNIT/ML FlexTouch Pen Inject 10 Units into the skin at bedtime. 08/22/19  Yes [provider]  LYUMJEV KWIKPEN 100 UNIT/ML KwikPen Inject 10-15 Units into the skin 3 (three) times daily with meals. 04/03/21  Yes [provider]  glucose blood test strip Use as instructed Patient taking differently: 1 each by Other route in the morning, at noon, and at bedtime. Use as instructed 11/18/17   [name], NP  Insulin Pen Needle (B-D UF III MINI PEN NEEDLES) 31G X 5 MM MISC Use as instructed Patient taking differently: 1 each by Other route in the morning, at noon, and at bedtime. Use as instructed 11/18/17   [name], NP  lisinopril (ZESTRIL) 20 MG tablet Take 1 tablet (20 mg total) by mouth daily. Patient not taking: Reported on 07/20/2023 06/29/23   Noralee Stain, DO    ___________________________________________________________________________________________________ Physical Exam:    10/12/2023    5:00 PM 10/12/2023    4:45 PM 10/12/2023    4:30 PM  Vitals with BMI  Systolic 91 96 96  Diastolic 43 48 51  Pulse 112 113 113     1. General:  in No  Acute distress    Chronically ill   -appearing 2. Psychological: Alert and   Oriented 3. Head/ENT:   Dry Mucous Membranes                          Head Non traumatic, neck supple                    Poor Dentition 4. SKIN:  decreased Skin turgor,  Skin clean Dry and intact no rash    5. Heart: Regular rate and rhythm no  Murmur, no Rub or gallop 6. Lungs:  no wheezes or crackles   7. Abdomen: Soft, epigastric ender, Non distended  bowel sounds present 8. Lower extremities: no clubbing, cyanosis, no  edema 9. Neurologically Grossly intact, moving all 4 extremities equally  10. MSK: Normal range of motion    Chart has been reviewed  ______________________________________________________________________________________________  48 y.o. female  with medical history significant of  DM1, HLD    Assessment/Plan Admitted for DKA   Present on Admission:  DKA, type 1, not at goal [facility])  Hyperlipidemia  AKI (acute kidney injury) (HCC)  Lactic acidosis  Abdominal pain  Dehydration     Hyperlipidemia Not on statins  DKA, type 1, not at goal [facility]) will admit per DKA/ protocol, obtain serial BMET, start on glucosestabalizer, aggressive IVF.   Change IVF to D5 1/2Na after BG <250 .  So far work up of possible causes of DKA/HSS with CXR, ECG one set of cardiac enzymes, UA.    Most likely cause been noncompliance Monitor in Stepdown. Replace potassium as needed.      Consult diabetes coordinator  AKI (acute kidney injury) (HCC) In the setting of severe dehydration and DKA Obtain electrolytes rehydrate follow fluid status  Lactic acidosis In the setting of dehydration will rehydrate and follow lactic acid  Abdominal pain Patient has been reporting abdominal pain associate DKA in the past but states this is more severe.  Epigastric pain.  Lipase ordered. Will order CT of abdomen to further evaluate  Dehydration Will rehydrate and follow fluid status Patient noted initially temperature 95 improved with Bair hugger.  Cause unclear.  At this point no evidence of infection but will obtain additional imaging CT abdomen and chest x-ray to further evaluate UA pending check procalcitonin treat as needed  Other plan as per orders.  DVT prophylaxis:  SCD      Code Status:  DNR/DNI as per  patient I had personally discussed CODE STATUS with patient and family  ACP   none    Family Communication:   Family  at  Bedside  plan of care was discussed   with Husband   Diet  Diet Orders (From admission, onward)     Start     Ordered   10/12/23 1417  Diet NPO time specified  Diet effective now        10/12/23 1418            Disposition Plan:        To home once workup is complete and patient is stable   Following  barriers for discharge:                                                          Electrolytes corrected                               Anemia h/H stable                                                     Will need consultants to evaluate patient prior to discharge       Consult Orders  (From admission, onward)           Start     Ordered   10/12/23 1800  Consult to hospitalist  Once       Provider:  (Not yet assigned)  Question Answer Comment  Place call to: [facility]   Reason for Consult Admit      10/12/23 1759                              Diabetes care coordinator                                     Consults called: none     Admission status:  ED Disposition     ED Disposition  Admit   Condition  --   Comment  Hospital [facility] [[id number]]  Level of Care: Stepdown [14]  Admit to SDU based on following criteria: Hemodynamic compromise or  significant risk of instability:  Patient requiring short term acute titration and management of vasoactive drips, and invasive monitoring (i.e., CVP and Arterial line).  May place patient in observation at [facility] or [facility] if equivalent level of care is available:: No  Covid Evaluation: Asymptomatic - no recent exposure (last 10 days) testing not required  Diagnosis: DKA, type 1, not at goal [facility]) [[id number]]  Admitting Physician: Sheronica Corey [[id number]]  Attending Physician: Britton Perkinson [[id number]]           Obs     Level of care       stepdown   tele indefinitely please discontinue once patient no longer qualifies COVID-19 Labs    Lab Results  Component Value Date   SARSCOV2NAA NEGATIVE 07/20/2023     Roper Tolson 10/12/2023, 7:45 PM    [facility]     after 2 AM please page floor coverage PA If 7AM-7PM, please contact the day team taking care of the patient using [email or web address]

## 2023-10-12 NOTE — Assessment & Plan Note (Signed)
 Patient has been reporting abdominal pain associate DKA in the past but states this is more severe.  Epigastric pain.  Lipase ordered. Will order CT of abdomen to further evaluate

## 2023-10-12 NOTE — ED Notes (Signed)
 Report given to John, RN can transport patient after shift change.

## 2023-10-12 NOTE — ED Provider Notes (Signed)
 I provided a substantive portion of the care of this patient.  I personally made/approved the management plan for this patient and take responsibility for the patient management.     Patient is type I diabetic.  She has history of recurrent DKA.  The patient's blood sugars have been running high this week.  She was however able to eat and drink reportedly as of yesterday.  Today she came by EMS with blood sugars greater than 600.  Patient's husband reports that they have had a lot of stress just recently and have had a lot of trouble controlling her blood sugars.  Patient is ill in appearance.  She is tachypneic and smells of ketones.  Heart is tachycardic.  Lungs grossly clear.  Abdomen is soft but diffusely tender.  No peripheral edema.  Extremities are warm to the touch with good perfusion.  Patient presents in DKA very symptomatic.  DKA order sets initiated.  I have rechecked on the patient after first liter of fluids.  Heart rate has come down to 114.  She still has ill appearance and feels unwell but does not show signs of impending respiratory failure and is gradually improving.  I agree with plan of management.  CRITICAL CARE Performed by: Wynetta Heckle   Total critical care time: 30 minutes  Critical care time was exclusive of separately billable procedures and treating other patients.  Critical care was necessary to treat or prevent imminent or life-threatening deterioration.  Critical care was time spent personally by me on the following activities: development of treatment plan with patient and/or surrogate as well as nursing, discussions with consultants, evaluation of patient's response to treatment, examination of patient, obtaining history from patient or surrogate, ordering and performing treatments and interventions, ordering and review of laboratory studies, ordering and review of radiographic studies, pulse oximetry and re-evaluation of patient's condition.    Wynetta Heckle, MD 10/12/23 [phone]

## 2023-10-12 NOTE — ED Provider Notes (Signed)
 [place] EMERGENCY DEPARTMENT AT [facility] Provider Note   CSN: [id number] Arrival date & time: 10/12/23  1345     History  Chief Complaint  Patient presents with   Hyperglycemia    Connie Vaughn is a 48 y.o. female.  Patient with past medical history of type 1 diabetes uncontrolled presenting to emergency room with complaint of hyperglycemia.  Patient reports that her sugars have been running as high on the monitor for the past 10 days.  She has apparently had some success trying to bring down sugar with insulin and has not missed doses. Family reports increased stress. Denies recent illness or trauma. She dose report severe generalized abdominal pain. Denies fevers or chills. Prior to today was eating and drinking.    Hyperglycemia Associated symptoms: abdominal pain        Home Medications Prior to Admission medications   Medication Sig Start Date End Date Taking? Authorizing Provider  Cholecalciferol (VITAMIN D3) 1.25 MG (50000 UT) CAPS Take 1 capsule by mouth once a week. 07/13/23   [provider]  EPINEPHrine 0.3 mg/0.3 mL IJ SOAJ injection Inject 0.3 mg into the muscle as needed for anaphylaxis. 07/12/23   [provider]  glucose blood test strip Use as instructed Patient taking differently: 1 each by Other route in the morning, at noon, and at bedtime. Use as instructed 11/18/17   [name], NP  insulin degludec (TRESIBA FLEXTOUCH) 100 UNIT/ML FlexTouch Pen Inject 10 Units into the skin at bedtime. 08/22/19   [provider]  Insulin Pen Needle (B-D UF III MINI PEN NEEDLES) 31G X 5 MM MISC Use as instructed Patient taking differently: 1 each by Other route in the morning, at noon, and at bedtime. Use as instructed 11/18/17   [name], NP  lisinopril (ZESTRIL) 20 MG tablet Take 1 tablet (20 mg total) by mouth daily. Patient not taking: Reported on 07/20/2023 06/29/23   Noralee Stain, DO  LYUMJEV KWIKPEN 100 UNIT/ML  KwikPen Inject 10-15 Units into the skin 3 (three) times daily with meals. 04/03/21   [provider]  psyllium (HYDROCIL/METAMUCIL) 95 % PACK Take 1 packet by mouth daily. 06/29/23   Noralee Stain, DO      Allergies    Sulfa antibiotics    Review of Systems   Review of Systems  Gastrointestinal:  Positive for abdominal pain.    Physical Exam Updated Vital Signs BP (!) 118/56 (BP Location: Left Arm)   Pulse (!) 113   Resp (!) 24   Ht 5\' 4"  (1.626 m)   Wt 59 kg   SpO2 100%   BMI 22.31 kg/m  Physical Exam Vitals and nursing note reviewed.  Constitutional:      General: She is not in acute distress.    Appearance: She is ill-appearing. She is not toxic-appearing.  HENT:     Head: Normocephalic and atraumatic.  Eyes:     General: No scleral icterus.    Conjunctiva/sclera: Conjunctivae normal.  Cardiovascular:     Rate and Rhythm: Regular rhythm. Tachycardia present.     Pulses: Normal pulses.     Heart sounds: Normal heart sounds.  Pulmonary:     Effort: Pulmonary effort is normal. No respiratory distress.     Breath sounds: Normal breath sounds.     Comments: Increase RR  Abdominal:     General: Abdomen is flat. Bowel sounds are normal.     Palpations: Abdomen is soft.  Tenderness: There is no abdominal tenderness.  Musculoskeletal:     Right lower leg: No edema.     Left lower leg: No edema.  Skin:    General: Skin is warm and dry.     Findings: No lesion.  Neurological:     General: No focal deficit present.     Mental Status: She is alert and oriented to person, place, and time. Mental status is at baseline.     ED Results / Procedures / Treatments   Labs (all labs ordered are listed, but only abnormal results are displayed) Labs Reviewed  BLOOD GAS, VENOUS - Abnormal; Notable for the following components:      Result Value   pH, Ven 7.02 (*)    pCO2, Ven <18 (*)    pO2, Ven 78 (*)    Bicarbonate 3.4 (*)    Acid-base deficit 25.7 (*)     All other components within normal limits  CBC WITH DIFFERENTIAL/PLATELET - Abnormal; Notable for the following components:   WBC 12.0 (*)    Hemoglobin 11.4 (*)    MCHC 29.8 (*)    Neutro Abs 8.8 (*)    Abs Immature Granulocytes 0.16 (*)    All other components within normal limits  LIPASE, BLOOD - Abnormal; Notable for the following components:   Lipase 139 (*)    All other components within normal limits  CBG MONITORING, ED - Abnormal; Notable for the following components:   Glucose-Capillary >600 (*)    All other components within normal limits  URINALYSIS, ROUTINE W REFLEX MICROSCOPIC  BETA-HYDROXYBUTYRIC ACID  HCG, QUANTITATIVE, PREGNANCY  RAPID URINE DRUG SCREEN, HOSP PERFORMED  COMPREHENSIVE METABOLIC PANEL WITH GFR  HCG, SERUM, QUALITATIVE  I-STAT CG4 LACTIC ACID, ED  I-STAT CHEM 8, ED  I-STAT CHEM 8, ED  I-STAT CG4 LACTIC ACID, ED    EKG None  Radiology No results found.  Procedures Procedures    Medications Ordered in ED Medications  insulin regular, human (MYXREDLIN) 100 units/ 100 mL infusion (8.5 Units/hr Intravenous New Bag/Given 10/12/23 1456)  lactated ringers infusion (has no administration in time range)  dextrose 5 % in lactated ringers infusion (has no administration in time range)  dextrose 50 % solution 0-50 mL (has no administration in time range)  lactated ringers bolus 1,180 mL (0 mLs Intravenous Stopped 10/12/23 1522)  HYDROmorphone (DILAUDID) injection 0.5 mg (0.5 mg Intravenous Given 10/12/23 1519)  metoCLOPramide (REGLAN) injection 5 mg (5 mg Intravenous Given 10/12/23 1516)  lactated ringers bolus 1,000 mL (1,000 mLs Intravenous New Bag/Given 10/12/23 1523)    ED Course/ Medical Decision Making/ A&P Clinical Course as of 10/12/23 1602  Mon Oct 12, 2023  1510 10 days  [CR]    Clinical Course User Index [CR] McIntosh Butter, PA                                 Medical Decision Making Amount and/or Complexity of Data Reviewed Labs:  ordered.  Risk Prescription drug management.   This patient presents to the ED for concern of hyperglycemia, this involves an extensive number of treatment options, and is a complaint that carries with it a high risk of complications and morbidity.  The differential diagnosis includes    Co morbidities that complicate the patient evaluation  DM1    Lab Tests:  I personally interpreted labs.  The pertinent results include:   Lactic 1.4, vbg remarkable  for pH of 7.02, bicarb is 3.4, chem w/ potassium of 7... CBC, CMP, Lipase, hcg, UA, beta hydroxy pending.    Imaging Studies ordered:  Considered, no focal area of abdominal tenderness, but feel repeat abdominal exam needed after nausea medication.    Cardiac Monitoring: / EKG:  The patient was maintained on a cardiac monitor.  I personally viewed and interpreted the cardiac monitored which showed an underlying rhythm of: sinus tachycardia     Problem List / ED Course / Critical interventions / Medication management  Reporting to emergency room with complaint of hyperglycemia.  Her blood sugars have been running high over the past 10 days.  She notes associated nausea and abdominal pain.  She reports she has been taking her insulin.  Notes increased stress and thinks that is what is causing this.  She has increased respiratory rate and appears very comfortable.  She is A&Ox4, following basic commands. She is not hypotensive but is quite tachycardic. No focal area of abdominal tenderness, but will reassess. Ordering labs and starting insulin with preliminary BG >600.  I ordered medication including LR, Insulin  Reevaluation of the patient after these medicines showed that the patient stayed the same I have reviewed the patients home medicines and have made adjustments as needed   Plan  Signed off to oncoming ED provider.  Labs are pending and reassessment. Feel given initial presentation patient will benefit from admission,  suspect DKA.         Final Clinical Impression(s) / ED Diagnoses Final diagnoses:  None    Rx / DC Orders ED Discharge Orders     None         Eudora Heron, PA-C 10/12/23 1607    Wynetta Heckle, MD 10/14/23 1504

## 2023-10-12 NOTE — Assessment & Plan Note (Signed)
 In the setting of severe dehydration and DKA Obtain electrolytes rehydrate follow fluid status

## 2023-10-12 NOTE — Assessment & Plan Note (Signed)
 In the setting of dehydration will rehydrate and follow lactic acid

## 2023-10-12 NOTE — ED Notes (Signed)
 ED TO INPATIENT HANDOFF REPORT  Name/Age/Gender Connie Vaughn 48 y.o. female  Code Status    Code Status Orders  (From admission, onward)           Start     Ordered   10/12/23 1845  Do not attempt resuscitation (DNR)- Limited -Do Not Intubate (DNI)  Continuous       Question Answer Comment  If pulseless and not breathing No CPR or chest compressions.   In Pre-Arrest Conditions (Patient Is Breathing and Has A Pulse) Do not intubate. Provide all appropriate non-invasive medical interventions. Avoid ICU transfer unless indicated or required.   Consent: Discussion documented in EHR or advanced directives reviewed      10/12/23 1845           Code Status History     Date Active Date Inactive Code Status Order ID Comments User Context   07/20/2023 1340 07/21/2023 1933 Full Code [id number]  Maryln Gottron, MD ED   06/28/2023 1017 06/29/2023 1527 Full Code [id number]  Bobette Mo, MD ED   03/06/2023 2029 03/08/2023 1615 Full Code [id number]  [name], DO Inpatient   01/15/2023 1320 01/17/2023 0004 Full Code [id number]  Synetta Fail, MD Inpatient   10/06/2020 2019 10/07/2020 1449 Full Code [id number]  Anselm Jungling, DO ED   10/16/2017 1837 10/17/2017 2001 Full Code [id number]  [name], DO Inpatient       Home/SNF/Other Home  Chief Complaint DKA, type 1, not at goal [facility]) [E10.10]  Level of Care/Admitting Diagnosis ED Disposition     ED Disposition  Admit   Condition  --   Comment  Hospital Area: [facility] [[id number]]  Level of Care: Stepdown [14]  Admit to SDU based on following criteria: Hemodynamic compromise or significant risk of instability:  Patient requiring short term acute titration and management of vasoactive drips, and invasive monitoring (i.e., CVP and Arterial line).  May place patient in observation at [facility] or [facility] if equivalent level of care is available:: No  Covid Evaluation: Asymptomatic - no recent  exposure (last 10 days) testing not required  Diagnosis: DKA, type 1, not at goal [facility]) [[id number]]  Admitting Physician: Therisa Doyne [[id number]]  Attending Physician: Therisa Doyne [[id number]]          Medical History Past Medical History:  Diagnosis Date   ADHD (attention deficit hyperactivity disorder)    AKI (acute kidney injury) (HCC) 10/06/2020   Anemia    Blood transfusion without reported diagnosis    Constipation    Diabetes mellitus type 1, uncontrolled    DKA (diabetic ketoacidosis) (HCC) 10/06/2020   GERD (gastroesophageal reflux disease)    Hyperlipidemia    Incontinence, feces     Allergies Allergies  Allergen Reactions   Sulfa Antibiotics Hives    IV Location/Drains/Wounds Patient Lines/Drains/Airways Status     Active Line/Drains/Airways     Name Placement date Placement time Site Days   Peripheral IV 10/12/23 20 G Anterior;Left Forearm 10/12/23  1422  Forearm  less than 1   Peripheral IV 10/12/23 18 G Anterior;Distal;Right Forearm 10/12/23  1451  Forearm  less than 1            Labs/Imaging Results for orders placed or performed during the hospital encounter of 10/12/23 (from the past 48 hours)  CBG monitoring, ED     Status: Abnormal   Collection Time: 10/12/23  2:06 PM  Result Value Ref Range   Glucose-Capillary >  600 (HH) 70 - 99 mg/dL    Comment: Glucose reference range applies only to samples taken after fasting for at least 8 hours.  Blood gas, venous     Status: Abnormal   Collection Time: 10/12/23  2:12 PM  Result Value Ref Range   pH, Ven 7.02 (LL) 7.25 - 7.43    Comment: CRITICAL RESULT CALLED TO, READ BACK BY AND VERIFIED WITH: Concepcion Living RN AT 1514 ON 10/12/2023 BY XIONG, K    pCO2, Ven <18 (LL) 44 - 60 mmHg    Comment: CRITICAL RESULT CALLED TO, READ BACK BY AND VERIFIED WITH: SAVOIE, C RN AT 1513 ON 10/12/2023 BY XIONG, K    pO2, Ven 78 (H) 32 - 45 mmHg   Bicarbonate 3.4 (L) 20.0 - 28.0 mmol/L   Acid-base deficit 25.7 (H)  0.0 - 2.0 mmol/L   O2 Saturation 93 %   Patient temperature 37.0     Comment: Performed at [facility], [address], [place], [place] [address]  CBC with Differential     Status: Abnormal   Collection Time: 10/12/23  2:26 PM  Result Value Ref Range   WBC 12.0 (H) 4.0 - 10.5 K/uL   RBC 3.93 3.87 - 5.11 MIL/uL   Hemoglobin 11.4 (L) 12.0 - 15.0 g/dL   HCT 29.5 62.1 - 30.8 %   MCV 97.5 80.0 - 100.0 fL   MCH 29.0 26.0 - 34.0 pg   MCHC 29.8 (L) 30.0 - 36.0 g/dL   RDW 65.7 84.6 - 96.2 %   Platelets 359 150 - 400 K/uL   nRBC 0.0 0.0 - 0.2 %   Neutrophils Relative % 73 %   Neutro Abs 8.8 (H) 1.7 - 7.7 K/uL   Lymphocytes Relative 20 %   Lymphs Abs 2.4 0.7 - 4.0 K/uL   Monocytes Relative 5 %   Monocytes Absolute 0.6 0.1 - 1.0 K/uL   Eosinophils Relative 0 %   Eosinophils Absolute 0.0 0.0 - 0.5 K/uL   Basophils Relative 1 %   Basophils Absolute 0.1 0.0 - 0.1 K/uL   Immature Granulocytes 1 %   Abs Immature Granulocytes 0.16 (H) 0.00 - 0.07 K/uL    Comment: Performed at [facility], [address] [place], [place] [address]  Beta-hydroxybutyric acid     Status: Abnormal   Collection Time: 10/12/23  2:26 PM  Result Value Ref Range   Beta-Hydroxybutyric Acid >8.00 (H) 0.05 - 0.27 mmol/L    Comment: RESULT CONFIRMED BY MANUAL DILUTION Performed at [facility], [address], [place], [place] [address]   hCG, quantitative, pregnancy     Status: None   Collection Time: 10/12/23  2:26 PM  Result Value Ref Range   hCG, Beta Chain, Quant, S <1 <5 mIU/mL    Comment:          GEST. AGE      CONC.  (mIU/mL)   <=1 WEEK        5 - 50     2 WEEKS       50 - 500     3 WEEKS       100 - 10,000     4 WEEKS     1,000 - 30,000     5 WEEKS     3,500 - 115,000   6-8 WEEKS     12,000 - 270,000    12 WEEKS     15,000 - 220,000  FEMALE AND NON-PREGNANT FEMALE:     LESS THAN 5 mIU/mL Performed at [facility], [address] [place], [place] [address]   Lipase, blood     Status: Abnormal   Collection Time: 10/12/23  2:26 PM  Result Value Ref Range   Lipase 139 (H) 11 - 51 U/L    Comment: Performed at [facility], [address], [place], [place] [address]  Comprehensive metabolic panel with GFR     Status: Abnormal   Collection Time: 10/12/23  2:26 PM  Result Value Ref Range   Sodium 132 (L) 135 - 145 mmol/L   Potassium 5.7 (H) 3.5 - 5.1 mmol/L   Chloride 97 (L) 98 - 111 mmol/L   CO2 <7 (L) 22 - 32 mmol/L   Glucose, Bld 728 (HH) 70 - 99 mg/dL    Comment: Glucose reference range applies only to samples taken after fasting for at least 8 hours. CRITICAL RESULT CALLED TO, READ BACK BY AND VERIFIED WITH: E. SALVADOR,RN ON 10/12/2023 AT 1609 BY SL    BUN 31 (H) 6 - 20 mg/dL   Creatinine, Ser 0.98 (H) 0.44 - 1.00 mg/dL   Calcium 8.8 (L) 8.9 - 10.3 mg/dL   Total Protein 6.6 6.5 - 8.1 g/dL   Albumin 3.1 (L) 3.5 - 5.0 g/dL   AST 22 15 - 41 U/L   ALT 19 0 - 44 U/L   Alkaline Phosphatase 106 38 - 126 U/L   Total Bilirubin 3.0 (H) 0.0 - 1.2 mg/dL   GFR, Estimated 39 (L) >60 mL/min    Comment: (NOTE) Calculated using the CKD-EPI Creatinine Equation (2021)    Anion gap NOT CALCULATED 5 - 15    Comment: Performed at [facility], [address], [place], [place] [address]  I-Stat CG4 Lactic Acid     Status: None   Collection Time: 10/12/23  2:33 PM  Result Value Ref Range   Lactic Acid, Venous 1.4 0.5 - 1.9 mmol/L  CBG monitoring, ED     Status: Abnormal   Collection Time: 10/12/23  4:05 PM  Result Value Ref Range   Glucose-Capillary 516 (HH) 70 - 99 mg/dL    Comment: Glucose reference range applies only to samples taken after fasting for at least 8 hours.   Comment 1 Notify RN   CBG monitoring, ED     Status: Abnormal   Collection Time: 10/12/23  4:38 PM  Result Value Ref Range   Glucose-Capillary 500 (H) 70 - 99 mg/dL    Comment: Glucose reference  range applies only to samples taken after fasting for at least 8 hours.  Blood gas, venous (at [facility] and [facility])     Status: Abnormal   Collection Time: 10/12/23  4:40 PM  Result Value Ref Range   pH, Ven 7.01 (LL) 7.25 - 7.43    Comment: KAUR, A RN AT 1656 ON 10/12/2023 BY XIONG, K   pCO2, Ven <18 (LL) 44 - 60 mmHg    Comment: CRITICAL RESULT CALLED TO, READ BACK BY AND VERIFIED WITH: Evelene Croon, A RN AT 1656 ON 10/12/2023 BY XIONG, K    pO2, Ven 74 (H) 32 - 45 mmHg   Bicarbonate 3.8 (L) 20.0 - 28.0 mmol/L   Acid-base deficit 25.5 (H) 0.0 - 2.0 mmol/L   O2 Saturation 92.7 %   Patient temperature 37.0     Comment: Performed at [facility], [address], [place], [place] [address]  I-Stat CG4 Lactic Acid     Status: Abnormal   Collection Time: 10/12/23  4:49 PM  Result Value Ref Range   Lactic Acid, Venous 2.3 (HH) 0.5 - 1.9 mmol/L   Comment NOTIFIED PHYSICIAN   CBG monitoring, ED     Status: Abnormal   Collection Time: 10/12/23  5:13 PM  Result Value Ref Range   Glucose-Capillary 397 (H) 70 - 99 mg/dL    Comment: Glucose reference range applies only to samples taken after fasting for at least 8 hours.  CBG monitoring, ED     Status: Abnormal   Collection Time: 10/12/23  6:19 PM  Result Value Ref Range   Glucose-Capillary 230 (H) 70 - 99 mg/dL    Comment: Glucose reference range applies only to samples taken after fasting for at least 8 hours.   No results found.  Pending Labs Unresulted Labs (From admission, onward)     Start     Ordered   10/13/23 0500  Vitamin B12  (Anemia Panel (PNL))  Tomorrow morning,   R        10/12/23 1907   10/13/23 0500  Folate  (Anemia Panel (PNL))  Tomorrow morning,   R        10/12/23 1907   10/12/23 1907  Iron and TIBC  (Anemia Panel (PNL))  Add-on,   AD        10/12/23 1907   10/12/23 1907  Ferritin  (Anemia Panel (PNL))  Add-on,   AD        10/12/23 1907   10/12/23 1907  Reticulocytes  (Anemia Panel (PNL))  Add-on,   AD         10/12/23 1907   10/12/23 1907  CK  Add-on,   AD        10/12/23 1907   10/12/23 1907  Magnesium  Add-on,   AD        10/12/23 1907   10/12/23 1907  Phosphorus  Add-on,   AD        10/12/23 1907   10/12/23 1907  Osmolality, urine  Once,   R        10/12/23 1907   10/12/23 1907  Osmolality  Add-on,   AD        10/12/23 1907   10/12/23 1907  Creatinine, urine, random  Once,   R        10/12/23 1907   10/12/23 1907  Sodium, urine, random  Once,   R        10/12/23 1907   10/12/23 1907  TSH  Add-on,   AD        10/12/23 1907   10/12/23 1907  Protime-INR  Once,   R       Question:  Release to patient  Answer:  Immediate   10/12/23 1907   10/12/23 1907  Gastrointestinal Panel by PCR , Stool  (Gastrointestinal Panel by PCR, Stool                                                                                                                                                     **  Does Not include CLOSTRIDIUM DIFFICILE testing. **If CDIFF testing is needed, place order from the "C Difficile Testing" order set.**)  Once,   R        10/12/23 1907   10/12/23 1849  Lactic acid, plasma  (Lactic Acid)  STAT Now then every 3 hours,   R (with STAT occurrences)      10/12/23 1848   10/12/23 1835  Blood gas, venous  Once,   R        10/12/23 1834   10/12/23 1826  CK  Once,   URGENT        10/12/23 1825   10/12/23 1824  Basic metabolic panel  Once,   STAT        10/12/23 1823   10/12/23 1630  hCG, serum, qualitative  Once,   R        10/12/23 1630   10/12/23 1522  Rapid urine drug screen (hospital performed)  ONCE - STAT,   STAT        10/12/23 1521   10/12/23 1413  Urinalysis, Routine w reflex microscopic -Urine, Clean Catch  Once,   URGENT       Question:  Specimen Source  Answer:  Urine, Clean Catch   10/12/23 1412   Signed and Held  Basic metabolic panel  (Diabetes Ketoacidosis (DKA))  STAT Now then every 4 hours ,   R      Signed and Held   Signed and Held  Beta-hydroxybutyric acid  (Diabetes  Ketoacidosis (DKA))  Now then every 4 hours,   R      Signed and Held   Signed and Held  Hemoglobin A1c  (Diabetes Ketoacidosis (DKA))  Once,   R       Comments: To assess prior glycemic control.    Signed and Held            Vitals/Pain Today's Vitals   10/12/23 1800 10/12/23 1815 10/12/23 1830 10/12/23 1845  BP: (!) 110/55 122/64 (!) 102/54 111/67  Pulse: (!) 113 (!) 114 (!) 111 (!) 106  Resp: 20 13 13 15   Temp:      TempSrc:      SpO2: 100% 100% 100% 100%  Weight:      Height:      PainSc:        Isolation Precautions Enteric precautions (UV disinfection)  Medications Medications  insulin regular, human (MYXREDLIN) 100 units/ 100 mL infusion (9 Units/hr Intravenous New Bag/Given 10/12/23 1641)  lactated ringers infusion (0 mLs Intravenous Stopped 10/12/23 1826)  dextrose 5 % in lactated ringers infusion ( Intravenous New Bag/Given 10/12/23 1826)  dextrose 50 % solution 0-50 mL (has no administration in time range)  fentaNYL (SUBLIMAZE) injection 25 mcg (has no administration in time range)  lactated ringers bolus 1,180 mL (0 mLs Intravenous Stopped 10/12/23 1522)  HYDROmorphone (DILAUDID) injection 0.5 mg (0.5 mg Intravenous Given 10/12/23 1519)  metoCLOPramide (REGLAN) injection 5 mg (5 mg Intravenous Given 10/12/23 1516)  lactated ringers bolus 1,000 mL (0 mLs Intravenous Stopped 10/12/23 1611)  lactated ringers bolus 1,000 mL (1,000 mLs Intravenous New Bag/Given 10/12/23 1745)    Mobility walks

## 2023-10-13 ENCOUNTER — Inpatient Hospital Stay (HOSPITAL_COMMUNITY)

## 2023-10-13 DIAGNOSIS — Z79899 Other long term (current) drug therapy: Secondary | ICD-10-CM | POA: Diagnosis not present

## 2023-10-13 DIAGNOSIS — N179 Acute kidney failure, unspecified: Secondary | ICD-10-CM | POA: Diagnosis present

## 2023-10-13 DIAGNOSIS — E860 Dehydration: Secondary | ICD-10-CM | POA: Diagnosis present

## 2023-10-13 DIAGNOSIS — Z66 Do not resuscitate: Secondary | ICD-10-CM | POA: Diagnosis present

## 2023-10-13 DIAGNOSIS — E785 Hyperlipidemia, unspecified: Secondary | ICD-10-CM | POA: Diagnosis present

## 2023-10-13 DIAGNOSIS — K859 Acute pancreatitis without necrosis or infection, unspecified: Secondary | ICD-10-CM | POA: Diagnosis present

## 2023-10-13 DIAGNOSIS — Z794 Long term (current) use of insulin: Secondary | ICD-10-CM | POA: Diagnosis not present

## 2023-10-13 DIAGNOSIS — Z8249 Family history of ischemic heart disease and other diseases of the circulatory system: Secondary | ICD-10-CM | POA: Diagnosis not present

## 2023-10-13 DIAGNOSIS — K219 Gastro-esophageal reflux disease without esophagitis: Secondary | ICD-10-CM | POA: Diagnosis present

## 2023-10-13 DIAGNOSIS — E1010 Type 1 diabetes mellitus with ketoacidosis without coma: Secondary | ICD-10-CM

## 2023-10-13 DIAGNOSIS — Z833 Family history of diabetes mellitus: Secondary | ICD-10-CM | POA: Diagnosis not present

## 2023-10-13 DIAGNOSIS — F909 Attention-deficit hyperactivity disorder, unspecified type: Secondary | ICD-10-CM | POA: Diagnosis present

## 2023-10-13 DIAGNOSIS — F17210 Nicotine dependence, cigarettes, uncomplicated: Secondary | ICD-10-CM | POA: Diagnosis present

## 2023-10-13 LAB — BASIC METABOLIC PANEL WITH GFR
Anion gap: 14 (ref 5–15)
Anion gap: 12 (ref 5–15)
Anion gap: 12 (ref 5–15)
Anion gap: 12 (ref 5–15)
Anion gap: 7 (ref 5–15)
Anion gap: 8 (ref 5–15)
BUN: 23 mg/dL — ABNORMAL HIGH (ref 6–20)
BUN: 20 mg/dL (ref 6–20)
BUN: 19 mg/dL (ref 6–20)
BUN: 18 mg/dL (ref 6–20)
BUN: 15 mg/dL (ref 6–20)
BUN: 12 mg/dL (ref 6–20)
CO2: 13 mmol/L — ABNORMAL LOW (ref 22–32)
CO2: 16 mmol/L — ABNORMAL LOW (ref 22–32)
CO2: 15 mmol/L — ABNORMAL LOW (ref 22–32)
CO2: 16 mmol/L — ABNORMAL LOW (ref 22–32)
CO2: 19 mmol/L — ABNORMAL LOW (ref 22–32)
CO2: 20 mmol/L — ABNORMAL LOW (ref 22–32)
Calcium: 8.1 mg/dL — ABNORMAL LOW (ref 8.9–10.3)
Calcium: 7.7 mg/dL — ABNORMAL LOW (ref 8.9–10.3)
Calcium: 7.9 mg/dL — ABNORMAL LOW (ref 8.9–10.3)
Calcium: 7.7 mg/dL — ABNORMAL LOW (ref 8.9–10.3)
Calcium: 7.6 mg/dL — ABNORMAL LOW (ref 8.9–10.3)
Calcium: 7.8 mg/dL — ABNORMAL LOW (ref 8.9–10.3)
Chloride: 110 mmol/L (ref 98–111)
Chloride: 108 mmol/L (ref 98–111)
Chloride: 108 mmol/L (ref 98–111)
Chloride: 105 mmol/L (ref 98–111)
Chloride: 106 mmol/L (ref 98–111)
Chloride: 108 mmol/L (ref 98–111)
Creatinine, Ser: 1.19 mg/dL — ABNORMAL HIGH (ref 0.44–1.00)
Creatinine, Ser: 1.08 mg/dL — ABNORMAL HIGH (ref 0.44–1.00)
Creatinine, Ser: 1.02 mg/dL — ABNORMAL HIGH (ref 0.44–1.00)
Creatinine, Ser: 0.97 mg/dL (ref 0.44–1.00)
Creatinine, Ser: 0.88 mg/dL (ref 0.44–1.00)
Creatinine, Ser: 0.78 mg/dL (ref 0.44–1.00)
GFR, Estimated: 57 mL/min — ABNORMAL LOW (ref >60)
GFR, Estimated: >60 mL/min (ref >60)
GFR, Estimated: >60 mL/min (ref >60)
GFR, Estimated: >60 mL/min (ref >60)
GFR, Estimated: >60 mL/min (ref >60)
GFR, Estimated: >60 mL/min (ref >60)
Glucose, Bld: 155 mg/dL — ABNORMAL HIGH (ref 70–99)
Glucose, Bld: 215 mg/dL — ABNORMAL HIGH (ref 70–99)
Glucose, Bld: 149 mg/dL — ABNORMAL HIGH (ref 70–99)
Glucose, Bld: 308 mg/dL — ABNORMAL HIGH (ref 70–99)
Glucose, Bld: 211 mg/dL — ABNORMAL HIGH (ref 70–99)
Glucose, Bld: 153 mg/dL — ABNORMAL HIGH (ref 70–99)
Potassium: 4.4 mmol/L (ref 3.5–5.1)
Potassium: 3.7 mmol/L (ref 3.5–5.1)
Potassium: 3.5 mmol/L (ref 3.5–5.1)
Potassium: 3.9 mmol/L (ref 3.5–5.1)
Potassium: 4.1 mmol/L (ref 3.5–5.1)
Potassium: 3.8 mmol/L (ref 3.5–5.1)
Sodium: 137 mmol/L (ref 135–145)
Sodium: 136 mmol/L (ref 135–145)
Sodium: 135 mmol/L (ref 135–145)
Sodium: 133 mmol/L — ABNORMAL LOW (ref 135–145)
Sodium: 132 mmol/L — ABNORMAL LOW (ref 135–145)
Sodium: 136 mmol/L (ref 135–145)

## 2023-10-13 LAB — GLUCOSE, CAPILLARY
Glucose-Capillary: 112 mg/dL — ABNORMAL HIGH (ref 70–99)
Glucose-Capillary: 124 mg/dL — ABNORMAL HIGH (ref 70–99)
Glucose-Capillary: 129 mg/dL — ABNORMAL HIGH (ref 70–99)
Glucose-Capillary: 131 mg/dL — ABNORMAL HIGH (ref 70–99)
Glucose-Capillary: 136 mg/dL — ABNORMAL HIGH (ref 70–99)
Glucose-Capillary: 138 mg/dL — ABNORMAL HIGH (ref 70–99)
Glucose-Capillary: 142 mg/dL — ABNORMAL HIGH (ref 70–99)
Glucose-Capillary: 148 mg/dL — ABNORMAL HIGH (ref 70–99)
Glucose-Capillary: 162 mg/dL — ABNORMAL HIGH (ref 70–99)
Glucose-Capillary: 163 mg/dL — ABNORMAL HIGH (ref 70–99)
Glucose-Capillary: 166 mg/dL — ABNORMAL HIGH (ref 70–99)
Glucose-Capillary: 172 mg/dL — ABNORMAL HIGH (ref 70–99)
Glucose-Capillary: 177 mg/dL — ABNORMAL HIGH (ref 70–99)
Glucose-Capillary: 178 mg/dL — ABNORMAL HIGH (ref 70–99)
Glucose-Capillary: 188 mg/dL — ABNORMAL HIGH (ref 70–99)
Glucose-Capillary: 205 mg/dL — ABNORMAL HIGH (ref 70–99)
Glucose-Capillary: 212 mg/dL — ABNORMAL HIGH (ref 70–99)
Glucose-Capillary: 226 mg/dL — ABNORMAL HIGH (ref 70–99)
Glucose-Capillary: 240 mg/dL — ABNORMAL HIGH (ref 70–99)
Glucose-Capillary: 264 mg/dL — ABNORMAL HIGH (ref 70–99)
Glucose-Capillary: 287 mg/dL — ABNORMAL HIGH (ref 70–99)
Glucose-Capillary: 292 mg/dL — ABNORMAL HIGH (ref 70–99)

## 2023-10-13 LAB — BETA-HYDROXYBUTYRIC ACID
Beta-Hydroxybutyric Acid: 4.28 mmol/L — ABNORMAL HIGH (ref 0.05–0.27)
Beta-Hydroxybutyric Acid: 2.46 mmol/L — ABNORMAL HIGH (ref 0.05–0.27)
Beta-Hydroxybutyric Acid: 1.6 mmol/L — ABNORMAL HIGH (ref 0.05–0.27)
Beta-Hydroxybutyric Acid: 3.82 mmol/L — ABNORMAL HIGH (ref 0.05–0.27)
Beta-Hydroxybutyric Acid: 1.88 mmol/L — ABNORMAL HIGH (ref 0.05–0.27)
Beta-Hydroxybutyric Acid: 0.95 mmol/L — ABNORMAL HIGH (ref 0.05–0.27)

## 2023-10-13 LAB — IRON AND TIBC
Iron: 22 ug/dL — ABNORMAL LOW (ref 28–170)
Saturation Ratios: 5 % — ABNORMAL LOW (ref 10.4–31.8)
TIBC: 419 ug/dL (ref 250–450)
UIBC: 397 ug/dL

## 2023-10-13 LAB — TSH: TSH: 1.696 u[IU]/mL (ref 0.350–4.500)

## 2023-10-13 LAB — VITAMIN B12: Vitamin B-12: 380 pg/mL (ref 180–914)

## 2023-10-13 LAB — OSMOLALITY, URINE: Osmolality, Ur: 485 mosm/kg (ref 300–900)

## 2023-10-13 LAB — OSMOLALITY: Osmolality: 311 mosm/kg — ABNORMAL HIGH (ref 275–295)

## 2023-10-13 LAB — FOLATE: Folate: 11 ng/mL (ref >5.9)

## 2023-10-13 LAB — FERRITIN: Ferritin: 18 ng/mL (ref 11–307)

## 2023-10-13 MED ORDER — IPRATROPIUM-ALBUTEROL 0.5-2.5 (3) MG/3ML IN SOLN: 3.0000 mL | RESPIRATORY_TRACT | Status: DC | PRN

## 2023-10-13 MED ORDER — CHLORHEXIDINE GLUCONATE CLOTH 2 % EX PADS
6.0000 | MEDICATED_PAD | Freq: Every day | CUTANEOUS | Status: DC
  Administered 2023-10-13 – 2023-10-14 (×2): 6 via TOPICAL

## 2023-10-13 MED ORDER — ACETAMINOPHEN 325 MG PO TABS: 650.0000 mg | ORAL_TABLET | Freq: Four times a day (QID) | ORAL | Status: DC | PRN

## 2023-10-13 MED ORDER — TRAZODONE HCL 50 MG PO TABS
50.0000 mg | ORAL_TABLET | Freq: Every evening | ORAL | Status: DC | PRN
  Administered 2023-10-13 – 2023-10-14 (×2): 50 mg via ORAL
  Filled 2023-10-13 (×2): qty 1

## 2023-10-13 MED ORDER — HYDRALAZINE HCL 20 MG/ML IJ SOLN: 10.0000 mg | INTRAMUSCULAR | Status: DC | PRN

## 2023-10-13 MED ORDER — DOCUSATE SODIUM 100 MG PO CAPS
100.0000 mg | ORAL_CAPSULE | Freq: Two times a day (BID) | ORAL | Status: DC
  Administered 2023-10-13 – 2023-10-15 (×4): 100 mg via ORAL
  Filled 2023-10-13 (×5): qty 1

## 2023-10-13 MED ORDER — POTASSIUM CHLORIDE CRYS ER 20 MEQ PO TBCR
40.0000 meq | EXTENDED_RELEASE_TABLET | Freq: Once | ORAL | Status: AC
  Administered 2023-10-13: 40 meq via ORAL
  Filled 2023-10-13: qty 2

## 2023-10-13 MED ORDER — POTASSIUM CHLORIDE 10 MEQ/100ML IV SOLN
10.0000 meq | INTRAVENOUS | Status: AC
  Administered 2023-10-13 (×2): 10 meq via INTRAVENOUS
  Filled 2023-10-13 (×5): qty 100

## 2023-10-13 MED ORDER — IOHEXOL 9 MG/ML PO SOLN
500.0000 mL | ORAL | Status: AC
  Administered 2023-10-13 (×2): 500 mL via ORAL

## 2023-10-13 MED ORDER — HYDROMORPHONE HCL 1 MG/ML IJ SOLN
0.5000 mg | INTRAMUSCULAR | Status: AC | PRN
  Administered 2023-10-13: 0.5 mg via INTRAVENOUS
  Filled 2023-10-13: qty 1

## 2023-10-13 MED ORDER — GLUCAGON HCL RDNA (DIAGNOSTIC) 1 MG IJ SOLR: 1.0000 mg | INTRAMUSCULAR | Status: DC | PRN

## 2023-10-13 MED ORDER — PHENOL 1.4 % MT LIQD
1.0000 | OROMUCOSAL | Status: DC | PRN
  Administered 2023-10-13: 1 via OROMUCOSAL
  Filled 2023-10-13: qty 177

## 2023-10-13 MED ORDER — PANTOPRAZOLE SODIUM 40 MG IV SOLR
40.0000 mg | Freq: Two times a day (BID) | INTRAVENOUS | Status: DC
  Administered 2023-10-13 – 2023-10-15 (×5): 40 mg via INTRAVENOUS
  Filled 2023-10-13 (×5): qty 10

## 2023-10-13 MED ORDER — IOHEXOL 9 MG/ML PO SOLN
ORAL | Status: AC
  Filled 2023-10-13: qty 1000

## 2023-10-13 MED ORDER — METOPROLOL TARTRATE 5 MG/5ML IV SOLN: 5.0000 mg | INTRAVENOUS | Status: DC | PRN

## 2023-10-13 MED ORDER — IOHEXOL 300 MG/ML  SOLN
100.0000 mL | Freq: Once | INTRAMUSCULAR | Status: AC | PRN
  Administered 2023-10-13: 100 mL via INTRAVENOUS

## 2023-10-13 MED ORDER — HYDROMORPHONE HCL 1 MG/ML IJ SOLN: 0.5000 mg | Freq: Once | INTRAMUSCULAR | Status: DC

## 2023-10-13 MED ORDER — FERROUS SULFATE 325 (65 FE) MG PO TABS
325.0000 mg | ORAL_TABLET | Freq: Two times a day (BID) | ORAL | Status: DC
  Administered 2023-10-13 – 2023-10-15 (×5): 325 mg via ORAL
  Filled 2023-10-13 (×5): qty 1

## 2023-10-13 MED ORDER — LACTATED RINGERS IV BOLUS
250.0000 mL | Freq: Once | INTRAVENOUS | Status: AC
  Administered 2023-10-13: 250 mL via INTRAVENOUS

## 2023-10-13 MED ORDER — ONDANSETRON HCL 4 MG/2ML IJ SOLN
4.0000 mg | Freq: Four times a day (QID) | INTRAMUSCULAR | Status: DC | PRN
  Administered 2023-10-13 – 2023-10-14 (×3): 4 mg via INTRAVENOUS
  Filled 2023-10-13 (×3): qty 2

## 2023-10-13 MED ORDER — HYDROMORPHONE HCL 1 MG/ML IJ SOLN
0.5000 mg | Freq: Once | INTRAMUSCULAR | Status: AC
  Administered 2023-10-13: 0.5 mg via INTRAVENOUS
  Filled 2023-10-13: qty 1

## 2023-10-13 MED ORDER — SODIUM CHLORIDE 0.9 % IV BOLUS
500.0000 mL | Freq: Once | INTRAVENOUS | Status: AC
  Administered 2023-10-13: 500 mL via INTRAVENOUS

## 2023-10-13 NOTE — Progress Notes (Signed)
 PROGRESS NOTE    Connie Vaughn  RUE:[id number] DOB: 1975/08/23 DOA: 10/12/2023 PCP: Jackelyn Poling, DO    Brief Narrative:  48 year old with history of DM1, HLD comes to the hospital with abdominal pain and weakness.  Apparently she has not been on insulin pump for the past week.  Upon admission diagnosed with DKA started on insulin drip/Endo tool.   Assessment & Plan:  Principal Problem:   DKA, type 1, not at goal [facility]) Active Problems:   Hyperlipidemia   AKI (acute kidney injury) (HCC)   Lactic acidosis   Abdominal pain   Dehydration   Diabetic ketoacidosis Diabetes mellitus type 1 - Currently patient has Endo tool ordered.  Will continue this as patient appears to be still acidotic with elevated beta hydroxybutyrate.  Anion gap is closed but at very high risk of reopening anion gap.  Continue to monitor electrolytes and replete as necessary. - A1c  Acute kidney injury, secondary to dehydration Lactic acidosis - Baseline creatinine 1.0, admission creatinine 1.62 improving with IV fluids.  Abdominal pain - Suspect secondary to DKA.  Lipase was minimally elevated.  Currently patient is n.p.o. getting IV fluids.  Clinically monitor -PPI twice daily - CT abdomen pelvis pending  Hyperlipidemia -Does not appear to be on home statin.  Will check lipid panel   DVT prophylaxis: SCDs Start: 10/12/23 2208    Code Status: Limited: Do not attempt resuscitation (DNR) -DNR-LIMITED -Do Not Intubate/DNI  Family Communication: Spouse at bedside DKA management.     Subjective: Still having abdominal discomfort radiating to her sides in her back Feeling nausea   Examination:  General exam: Mild distress abdominal pain Respiratory system: Clear to auscultation. Respiratory effort normal. Cardiovascular system: S1 & S2 heard, RRR. No JVD, murmurs, rubs, gallops or clicks. No pedal edema. Gastrointestinal system: Abdomen is nondistended, soft and nontender. No organomegaly or  masses felt. Normal bowel sounds heard. Central nervous system: Alert and oriented. No focal neurological deficits. Extremities: Symmetric 5 x 5 power. Skin: No rashes, lesions or ulcers Psychiatry: Judgement and insight appear normal. Mood & affect appropriate.                Diet Orders (From admission, onward)     Start     Ordered   10/13/23 0135  Diet NPO time specified Except for: Sips with Meds, Ice Chips  (Diabetes Ketoacidosis (DKA))  Diet effective now       Question Answer Comment  Except for Sips with Meds   Except for Ice Chips      10/13/23 0134            Objective: Vitals:   10/13/23 0700 10/13/23 0800 10/13/23 0900 10/13/23 1000  BP: (!) 142/75 (!) 134/54 (!) 147/57 (!) 157/82  Pulse: (!) 103 (!) 108 (!) 108   Resp: 15 17 12 13   Temp:  98.2 F (36.8 C)    TempSrc:  Oral    SpO2: 100% 100% 100%   Weight:      Height:        Intake/Output Summary (Last 24 hours) at 10/13/2023 1108 Last data filed at 10/13/2023 1053 Gross per 24 hour  Intake 5889.88 ml  Output 800 ml  Net 5089.88 ml   Filed Weights   10/12/23 1402 10/12/23 1956  Weight: 59 kg 60.9 kg    Scheduled Meds:  Chlorhexidine Gluconate Cloth  6 each Topical Daily   docusate sodium  100 mg Oral BID   ferrous sulfate  325  mg Oral BID WC   pantoprazole (PROTONIX) IV  40 mg Intravenous Q12H   Continuous Infusions:  dextrose 5 % and 0.45 % NaCl 125 mL/hr at 10/13/23 0459   insulin 0.7 Units/hr (10/13/23 0700)   lactated ringers Stopped (10/12/23 1824)   lactated ringers      Nutritional status     Body mass index is 23.05 kg/m.  Data Reviewed:   CBC: Recent Labs  Lab 10/12/23 1426  WBC 12.0*  NEUTROABS 8.8*  HGB 11.4*  HCT 38.3  MCV 97.5  PLT 359   Basic Metabolic Panel: Recent Labs  Lab 10/12/23 1853 10/12/23 2055 10/13/23 0130 10/13/23 0623 10/13/23 1013  NA 138 137 137 136 135  K 4.2 4.6 4.4 3.7 3.5  CL 108 108 110 108 108  CO2 <7* 10* 13* 16* 15*   GLUCOSE 188* 161* 155* 215* 149*  BUN 26* 24* 23* 20 19  CREATININE 1.41* 1.18* 1.19* 1.08* 1.02*  CALCIUM 8.4* 8.3* 8.1* 7.7* 7.9*  MG  --  2.0  --   --   --   PHOS  --  3.4  --   --   --    GFR: Estimated Creatinine Clearance: 58.9 mL/min (A) (by C-G formula based on SCr of 1.02 mg/dL (H)). Liver Function Tests: Recent Labs  Lab 10/12/23 1426  AST 22  ALT 19  ALKPHOS 106  BILITOT 3.0*  PROT 6.6  ALBUMIN 3.1*   Recent Labs  Lab 10/12/23 1426  LIPASE 139*   No results for input(s): "AMMONIA" in the last 168 hours. Coagulation Profile: Recent Labs  Lab 10/12/23 2055  INR 1.0   Cardiac Enzymes: Recent Labs  Lab 10/12/23 1853 10/12/23 2055  CKTOTAL 112 138   BNP (last 3 results) No results for input(s): "PROBNP" in the last 8760 hours. HbA1C: No results for input(s): "HGBA1C" in the last 72 hours. CBG: Recent Labs  Lab 10/13/23 0659 10/13/23 0808 10/13/23 0908 10/13/23 1007 10/13/23 1105  GLUCAP 148* 240* 292* 142* 112*   Lipid Profile: No results for input(s): "CHOL", "HDL", "LDLCALC", "TRIG", "CHOLHDL", "LDLDIRECT" in the last 72 hours. Thyroid Function Tests: Recent Labs    10/12/23 2055  TSH 1.696   Anemia Panel: Recent Labs    10/12/23 2055 10/13/23 0623  VITAMINB12  --  380  FOLATE  --  11.0  FERRITIN 18  --   TIBC 419  --   IRON 22*  --   RETICCTPCT 1.3  --    Sepsis Labs: Recent Labs  Lab 10/12/23 1433 10/12/23 1649 10/12/23 1853 10/12/23 2055  PROCALCITON  --   --   --  0.29  LATICACIDVEN 1.4 2.3* 5.6* 3.3*    Recent Results (from the past 240 hours)  Resp panel by RT-PCR (RSV, Flu A&B, Covid)     Status: None   Collection Time: 10/12/23  9:52 PM   Specimen: Nasal Swab  Result Value Ref Range Status   SARS Coronavirus 2 by RT PCR NEGATIVE NEGATIVE Final    Comment: (NOTE) SARS-CoV-2 target nucleic acids are NOT DETECTED.  The SARS-CoV-2 RNA is generally detectable in upper respiratory specimens during the acute  phase of infection. The lowest concentration of SARS-CoV-2 viral copies this assay can detect is 138 copies/mL. A negative result does not preclude SARS-Cov-2 infection and should not be used as the sole basis for treatment or other patient management decisions. A negative result may occur with  improper specimen collection/handling, submission of specimen other than  nasopharyngeal swab, presence of viral mutation(s) within the areas targeted by this assay, and inadequate number of viral copies(<138 copies/mL). A negative result must be combined with clinical observations, patient history, and epidemiological information. The expected result is Negative.  Fact Sheet for Patients:  BloggerCourse.com  Fact Sheet for Healthcare Providers:  SeriousBroker.it  This test is no t yet approved or cleared by the [place]  FDA and  has been authorized for detection and/or diagnosis of SARS-CoV-2 by FDA under an Emergency Use Authorization (EUA). This EUA will remain  in effect (meaning this test can be used) for the duration of the COVID-19 declaration under Section 564(b)(1) of the Act, 21 U.S.C.section 360bbb-3(b)(1), unless the authorization is terminated  or revoked sooner.       Influenza A by PCR NEGATIVE NEGATIVE Final   Influenza B by PCR NEGATIVE NEGATIVE Final    Comment: (NOTE) The Xpert Xpress SARS-CoV-2/FLU/RSV plus assay is intended as an aid in the diagnosis of influenza from Nasopharyngeal swab specimens and should not be used as a sole basis for treatment. Nasal washings and aspirates are unacceptable for Xpert Xpress SARS-CoV-2/FLU/RSV testing.  Fact Sheet for Patients: BloggerCourse.com  Fact Sheet for Healthcare Providers: SeriousBroker.it  This test is not yet approved or cleared by the [place]  FDA and has been authorized for detection and/or diagnosis of  SARS-CoV-2 by FDA under an Emergency Use Authorization (EUA). This EUA will remain in effect (meaning this test can be used) for the duration of the COVID-19 declaration under Section 564(b)(1) of the Act, 21 U.S.C. section 360bbb-3(b)(1), unless the authorization is terminated or revoked.     Resp Syncytial Virus by PCR NEGATIVE NEGATIVE Final    Comment: (NOTE) Fact Sheet for Patients: BloggerCourse.com  Fact Sheet for Healthcare Providers: SeriousBroker.it  This test is not yet approved or cleared by the [place]  FDA and has been authorized for detection and/or diagnosis of SARS-CoV-2 by FDA under an Emergency Use Authorization (EUA). This EUA will remain in effect (meaning this test can be used) for the duration of the COVID-19 declaration under Section 564(b)(1) of the Act, 21 U.S.C. section 360bbb-3(b)(1), unless the authorization is terminated or revoked.  Performed at [facility], [address] [place], [place] [address]          Radiology Studies: DG Chest Port 1 View Result Date: 10/12/2023 CLINICAL DATA:  Chest pain, tachycardia, tachypnea EXAM: PORTABLE CHEST 1 VIEW COMPARISON:  03/06/2023 FINDINGS: The heart size and mediastinal contours are within normal limits. Both lungs are clear. The visualized skeletal structures are unremarkable. IMPRESSION: No active disease. Electronically Signed   By: Janeece Mechanic M.D.   On: 10/12/2023 21:00           LOS: 0 days   Time spent= 35 mins    Maggie Schooner, MD [facility]  If 7PM-7AM, please contact night-coverage  10/13/2023, 11:08 AM

## 2023-10-13 NOTE — Plan of Care (Signed)
  Problem: Clinical Measurements: Goal: Diagnostic test results will improve Outcome: Not Progressing   Problem: Activity: Goal: Risk for activity intolerance will decrease Outcome: Not Progressing   Problem: Education: Goal: Ability to describe self-care measures that may prevent or decrease complications (Diabetes Survival Skills Education) will improve Outcome: Not Progressing   Problem: Metabolic: Goal: Ability to maintain appropriate glucose levels will improve Outcome: Not Progressing

## 2023-10-13 NOTE — Hospital Course (Addendum)
 Brief Narrative:  48 year old with history of DM1, HLD comes to the hospital with abdominal pain and weakness.  Apparently she has not been on insulin pump for the past week.  Upon admission diagnosed with DKA started on insulin drip/Endo tool.   Assessment & Plan:  Principal Problem:   DKA, type 1, not at goal [facility]) Active Problems:   Hyperlipidemia   AKI (acute kidney injury) (HCC)   Lactic acidosis   Abdominal pain   Dehydration   Diabetic ketoacidosis Diabetes mellitus type 1 - DKA has resolved.  A1c 11.4.  Beta hydroxybutyrate is now normal.  Transition to subcu and sliding scale.  Adjust as necessary.  Seen by diabetic coordinator.  Acute kidney injury, secondary to dehydration, resolved Lactic acidosis - Baseline creatinine 1.0, admission creatinine 1.62 improving with IV fluids.  Creatinine today 0.8  Acute uncomplicated pancreatitis - Diet as tolerated.  PPI.  Hyperlipidemia -Does not appear to be on home statin.  Will check lipid panel   DVT prophylaxis: SCDs Start: 10/12/23 2208    Code Status: Limited: Do not attempt resuscitation (DNR) -DNR-LIMITED -Do Not Intubate/DNI  Family Communication: Husband at bedside Acute pancreatitis     Subjective: Doing ok Some nausea with breakfast this morning.    Examination:  General exam: Mild distress abdominal pain Respiratory system: Clear to auscultation. Respiratory effort normal. Cardiovascular system: S1 & S2 heard, RRR. No JVD, murmurs, rubs, gallops or clicks. No pedal edema. Gastrointestinal system: Abdomen is nondistended, soft and nontender. No organomegaly or masses felt. Normal bowel sounds heard. Central nervous system: Alert and oriented. No focal neurological deficits. Extremities: Symmetric 5 x 5 power. Skin: No rashes, lesions or ulcers Psychiatry: Judgement and insight appear normal. Mood & affect appropriate.

## 2023-10-13 NOTE — Plan of Care (Signed)

## 2023-10-13 NOTE — Inpatient Diabetes Management (Signed)
 Inpatient Diabetes Program Recommendations  AACE/ADA: New Consensus Statement on Inpatient Glycemic Control (2015)  Target Ranges:  Prepandial:   less than 140 mg/dL      Peak postprandial:   less than 180 mg/dL (1-2 hours)      Critically ill patients:  140 - 180 mg/dL   Lab Results  Component Value Date   GLUCAP 264 (H) 10/13/2023   HGBA1C 11.4 (H) 01/15/2023    Review of Glycemic Control  Diabetes history: DM1 Outpatient Diabetes medications: Tresiba 10 at bedtime, Lyumjev 10-15 units TID Current orders for Inpatient glycemic control: IV insulin per EndoTool for DKA  Has CGM  BHB 3.82  Inpatient Diabetes Program Recommendations:    Continue IV insulin until criteria met for discontinuation.  Consider Semglee 10-12 units Q24H  Consider Novolog 0-9 TID with meals and 0-5 HS  Novolog 4 units TID with meals if eating > 50%  Spoke with pt and husband at bedside regarding her T1DM. States blood sugars are all over the place at home. Has Libre CGM and it was recommended to use alarms for both high and low blood sugars. Pt sees Endo and will need f/u appt within a week of discharge. Interested in insulin pump. Needs to show willingness to control blood sugars prior to getting pump. Stressed importance of taking care of herself. Pt was busy texting on phone and said I could just talk with husband. Explained that pt needs to take responsibility for controlling blood sugars by eating healthy, taking insulin as prescribed, stress management and f/u with Endo. Has not been taking [place] d/t thinking it was giving her leg cramps. Discussed glucose and A1C goals. Discussed importance of checking CBGs and maintaining good CBG control to prevent long-term and short-term complications. Explained how hyperglycemia leads to damage within blood vessels which lead to the common complications seen with uncontrolled diabetes. Stressed to the patient the importance of improving glycemic control to  prevent further complications from uncontrolled diabetes. Discussed impact of nutrition, exercise, stress, sickness, and medications on diabetes control.  Discussed carbohydrates, carbohydrate goals per day and meal, along with portion sizes. Discussed hypoglycemia s/s and treatment.   Will f/u in am. Continues on IV insulin.  Thank you. Joni Net, RD, LDN, CDCES Inpatient Diabetes Coordinator [phone]

## 2023-10-14 ENCOUNTER — Encounter (INDEPENDENT_AMBULATORY_CARE_PROVIDER_SITE_OTHER): Admitting: Ophthalmology

## 2023-10-14 DIAGNOSIS — H3581 Retinal edema: Secondary | ICD-10-CM

## 2023-10-14 DIAGNOSIS — E1010 Type 1 diabetes mellitus with ketoacidosis without coma: Secondary | ICD-10-CM | POA: Diagnosis not present

## 2023-10-14 LAB — BASIC METABOLIC PANEL WITH GFR
Anion gap: 6 (ref 5–15)
BUN: 12 mg/dL (ref 6–20)
CO2: 20 mmol/L — ABNORMAL LOW (ref 22–32)
Calcium: 7.6 mg/dL — ABNORMAL LOW (ref 8.9–10.3)
Chloride: 108 mmol/L (ref 98–111)
Creatinine, Ser: 0.83 mg/dL (ref 0.44–1.00)
GFR, Estimated: >60 mL/min (ref >60)
Glucose, Bld: 144 mg/dL — ABNORMAL HIGH (ref 70–99)
Potassium: 4 mmol/L (ref 3.5–5.1)
Sodium: 134 mmol/L — ABNORMAL LOW (ref 135–145)

## 2023-10-14 LAB — BETA-HYDROXYBUTYRIC ACID: Beta-Hydroxybutyric Acid: 0.25 mmol/L (ref 0.05–0.27)

## 2023-10-14 LAB — GLUCOSE, CAPILLARY
Glucose-Capillary: 102 mg/dL — ABNORMAL HIGH (ref 70–99)
Glucose-Capillary: 108 mg/dL — ABNORMAL HIGH (ref 70–99)
Glucose-Capillary: 116 mg/dL — ABNORMAL HIGH (ref 70–99)
Glucose-Capillary: 118 mg/dL — ABNORMAL HIGH (ref 70–99)
Glucose-Capillary: 130 mg/dL — ABNORMAL HIGH (ref 70–99)
Glucose-Capillary: 134 mg/dL — ABNORMAL HIGH (ref 70–99)
Glucose-Capillary: 162 mg/dL — ABNORMAL HIGH (ref 70–99)
Glucose-Capillary: 179 mg/dL — ABNORMAL HIGH (ref 70–99)
Glucose-Capillary: 207 mg/dL — ABNORMAL HIGH (ref 70–99)
Glucose-Capillary: 214 mg/dL — ABNORMAL HIGH (ref 70–99)

## 2023-10-14 LAB — CBC
HCT: 28.8 % — ABNORMAL LOW (ref 36.0–46.0)
Hemoglobin: 8.8 g/dL — ABNORMAL LOW (ref 12.0–15.0)
MCH: 28.9 pg (ref 26.0–34.0)
MCHC: 30.6 g/dL (ref 30.0–36.0)
MCV: 94.7 fL (ref 80.0–100.0)
Platelets: 255 10*3/uL (ref 150–400)
RBC: 3.04 MIL/uL — ABNORMAL LOW (ref 3.87–5.11)
RDW: 15.5 % (ref 11.5–15.5)
WBC: 7.5 10*3/uL (ref 4.0–10.5)
nRBC: 0 % (ref 0.0–0.2)

## 2023-10-14 LAB — HEMOGLOBIN A1C
Hgb A1c MFr Bld: 11.4 % — ABNORMAL HIGH (ref 4.8–5.6)
Mean Plasma Glucose: 280 mg/dL

## 2023-10-14 LAB — PHOSPHORUS: Phosphorus: 2.3 mg/dL — ABNORMAL LOW (ref 2.5–4.6)

## 2023-10-14 LAB — MAGNESIUM: Magnesium: 1.9 mg/dL (ref 1.7–2.4)

## 2023-10-14 MED ORDER — INSULIN ASPART 100 UNIT/ML IJ SOLN: 0.0000 [IU] | Freq: Every day | INTRAMUSCULAR | Status: DC

## 2023-10-14 MED ORDER — INSULIN ASPART 100 UNIT/ML IJ SOLN: 0.0000 [IU] | Freq: Three times a day (TID) | INTRAMUSCULAR | Status: DC

## 2023-10-14 MED ORDER — INSULIN REGULAR(HUMAN) IN NACL 100-0.9 UT/100ML-% IV SOLN: INTRAVENOUS | Status: AC

## 2023-10-14 MED ORDER — INSULIN GLARGINE-YFGN 100 UNIT/ML ~~LOC~~ SOLN
25.0000 [IU] | Freq: Every day | SUBCUTANEOUS | Status: DC
  Administered 2023-10-14 – 2023-10-15 (×2): 25 [IU] via SUBCUTANEOUS
  Filled 2023-10-14 (×3): qty 0.25

## 2023-10-14 MED ORDER — POTASSIUM PHOSPHATES 15 MMOLE/5ML IV SOLN
15.0000 mmol | Freq: Once | INTRAVENOUS | Status: AC
  Administered 2023-10-14: 15 mmol via INTRAVENOUS
  Filled 2023-10-14: qty 5

## 2023-10-14 NOTE — Progress Notes (Signed)
   10/14/23 1426  TOC Brief Assessment  Insurance and Status Reviewed  Patient has primary care physician Yes  Home environment has been reviewed Apartment w/ spouse  Prior level of function: Independent  Prior/Current Home Services No current home services  Social Drivers of Health Review SDOH reviewed no interventions necessary  Readmission risk has been reviewed Yes  Transition of care needs no transition of care needs at this time

## 2023-10-14 NOTE — Inpatient Diabetes Management (Signed)
 Inpatient Diabetes Program Recommendations  AACE/ADA: New Consensus Statement on Inpatient Glycemic Control (2015)  Target Ranges:  Prepandial:   less than 140 mg/dL      Peak postprandial:   less than 180 mg/dL (1-2 hours)      Critically ill patients:  140 - 180 mg/dL   Lab Results  Component Value Date   GLUCAP 118 (H) 10/14/2023   HGBA1C 11.4 (H) 10/12/2023    Review of Glycemic Control  Diabetes history: DM1 Outpatient Diabetes medications: Tresiba 10 at bedtime, Lyumjev 10-15 units TID Current orders for Inpatient glycemic control: Semglee 25 daily, Novolog 0-9 TID and 0-5 HS  FL diet today d/t nausea  Inpatient Diabetes Program Recommendations:    Will need meal coverage if eating > 50% - Novolog 4 units TID with meals  Spoke with pt at bedside regarding her diabetes control. Tried to make appt with Endo, LM although she did not receive a call back. Continues to be interested in insulin pump. Stressed importance of taking insulin, not skipping meals and monitor with CGM at least 4x/day. Pt states she has memory problems and when I asked her to elaborate, she said she needs her husband to be present to talk about it.   Will continue to follow.  Thank you. Joni Net, RD, LDN, CDCES Inpatient Diabetes Coordinator [phone]

## 2023-10-14 NOTE — Progress Notes (Signed)
 PROGRESS NOTE    Connie Vaughn  GEX:[id number] DOB: 05-11-1976 DOA: 10/12/2023 PCP: Jackelyn Poling, DO    Brief Narrative:  48 year old with history of DM1, HLD comes to the hospital with abdominal pain and weakness.  Apparently she has not been on insulin pump for the past week.  Upon admission diagnosed with DKA started on insulin drip/Endo tool.   Assessment & Plan:  Principal Problem:   DKA, type 1, not at goal [facility]) Active Problems:   Hyperlipidemia   AKI (acute kidney injury) (HCC)   Lactic acidosis   Abdominal pain   Dehydration   Diabetic ketoacidosis Diabetes mellitus type 1 - DKA has resolved.  A1c 11.4.  Beta hydroxybutyrate is now normal.  Transition to subcu and sliding scale.  Adjust as necessary.  Seen by diabetic coordinator.  Acute kidney injury, secondary to dehydration, resolved Lactic acidosis - Baseline creatinine 1.0, admission creatinine 1.62 improving with IV fluids.  Creatinine today 0.8  Acute uncomplicated pancreatitis - Diet as tolerated.  PPI.  Hyperlipidemia -Does not appear to be on home statin.  Will check lipid panel   DVT prophylaxis: SCDs Start: 10/12/23 2208    Code Status: Limited: Do not attempt resuscitation (DNR) -DNR-LIMITED -Do Not Intubate/DNI  Family Communication: Husband at bedside Acute pancreatitis     Subjective: Doing ok Some nausea with breakfast this morning.    Examination:  General exam: Mild distress abdominal pain Respiratory system: Clear to auscultation. Respiratory effort normal. Cardiovascular system: S1 & S2 heard, RRR. No JVD, murmurs, rubs, gallops or clicks. No pedal edema. Gastrointestinal system: Abdomen is nondistended, soft and nontender. No organomegaly or masses felt. Normal bowel sounds heard. Central nervous system: Alert and oriented. No focal neurological deficits. Extremities: Symmetric 5 x 5 power. Skin: No rashes, lesions or ulcers Psychiatry: Judgement and insight appear normal.  Mood & affect appropriate.                Diet Orders (From admission, onward)     Start     Ordered   10/14/23 1032  Diet full liquid Room service appropriate? Yes; Fluid consistency: Thin  Diet effective now       Question Answer Comment  Room service appropriate? Yes   Fluid consistency: Thin      10/14/23 1031            Objective: Vitals:   10/14/23 0600 10/14/23 0700 10/14/23 0800 10/14/23 0900  BP: 122/69     Pulse: 91 93 (!) 104 94  Resp: (!) 21 13 14 17   Temp:   98.1 F (36.7 C)   TempSrc:   Oral   SpO2: 97% 99% 98% 95%  Weight:      Height:        Intake/Output Summary (Last 24 hours) at 10/14/2023 1126 Last data filed at 10/14/2023 0900 Gross per 24 hour  Intake 1689.34 ml  Output --  Net 1689.34 ml   Filed Weights   10/12/23 1402 10/12/23 1956  Weight: 59 kg 60.9 kg    Scheduled Meds:  Chlorhexidine Gluconate Cloth  6 each Topical Daily   docusate sodium  100 mg Oral BID   ferrous sulfate  325 mg Oral BID WC   insulin aspart  0-5 Units Subcutaneous QHS   insulin aspart  0-9 Units Subcutaneous TID WC   insulin glargine-yfgn  25 Units Subcutaneous Daily   pantoprazole (PROTONIX) IV  40 mg Intravenous Q12H   Continuous Infusions:  potassium PHOSPHATE IVPB (in mmol)  15 mmol (10/14/23 1610)    Nutritional status     Body mass index is 23.05 kg/m.  Data Reviewed:   CBC: Recent Labs  Lab 10/12/23 1426 10/14/23 0251  WBC 12.0* 7.5  NEUTROABS 8.8*  --   HGB 11.4* 8.8*  HCT 38.3 28.8*  MCV 97.5 94.7  PLT 359 255   Basic Metabolic Panel: Recent Labs  Lab 10/12/23 2055 10/13/23 0130 10/13/23 1013 10/13/23 1358 10/13/23 1752 10/13/23 2143 10/14/23 0251  NA 137   < > 135 133* 132* 136 134*  K 4.6   < > 3.5 3.9 4.1 3.8 4.0  CL 108   < > 108 105 106 108 108  CO2 10*   < > 15* 16* 19* 20* 20*  GLUCOSE 161*   < > 149* 308* 211* 153* 144*  BUN 24*   < > 19 18 15 12 12   CREATININE 1.18*   < > 1.02* 0.97 0.88 0.78 0.83   CALCIUM 8.3*   < > 7.9* 7.7* 7.6* 7.8* 7.6*  MG 2.0  --   --   --   --   --  1.9  PHOS 3.4  --   --   --   --   --  2.3*   < > = values in this interval not displayed.   GFR: Estimated Creatinine Clearance: 72.4 mL/min (by C-G formula based on SCr of 0.83 mg/dL). Liver Function Tests: Recent Labs  Lab 10/12/23 1426  AST 22  ALT 19  ALKPHOS 106  BILITOT 3.0*  PROT 6.6  ALBUMIN 3.1*   Recent Labs  Lab 10/12/23 1426  LIPASE 139*   No results for input(s): "AMMONIA" in the last 168 hours. Coagulation Profile: Recent Labs  Lab 10/12/23 2055  INR 1.0   Cardiac Enzymes: Recent Labs  Lab 10/12/23 1853 10/12/23 2055  CKTOTAL 112 138   BNP (last 3 results) No results for input(s): "PROBNP" in the last 8760 hours. HbA1C: Recent Labs    10/12/23 2055  HGBA1C 11.4*   CBG: Recent Labs  Lab 10/14/23 0250 10/14/23 0359 10/14/23 0503 10/14/23 0619 10/14/23 0725  GLUCAP 134* 207* 179* 130* 118*   Lipid Profile: No results for input(s): "CHOL", "HDL", "LDLCALC", "TRIG", "CHOLHDL", "LDLDIRECT" in the last 72 hours. Thyroid Function Tests: Recent Labs    10/12/23 2055  TSH 1.696   Anemia Panel: Recent Labs    10/12/23 2055 10/13/23 0623  VITAMINB12  --  380  FOLATE  --  11.0  FERRITIN 18  --   TIBC 419  --   IRON 22*  --   RETICCTPCT 1.3  --    Sepsis Labs: Recent Labs  Lab 10/12/23 1433 10/12/23 1649 10/12/23 1853 10/12/23 2055  PROCALCITON  --   --   --  0.29  LATICACIDVEN 1.4 2.3* 5.6* 3.3*    Recent Results (from the past 240 hours)  Resp panel by RT-PCR (RSV, Flu A&B, Covid)     Status: None   Collection Time: 10/12/23  9:52 PM   Specimen: Nasal Swab  Result Value Ref Range Status   SARS Coronavirus 2 by RT PCR NEGATIVE NEGATIVE Final    Comment: (NOTE) SARS-CoV-2 target nucleic acids are NOT DETECTED.  The SARS-CoV-2 RNA is generally detectable in upper respiratory specimens during the acute phase of infection. The  lowest concentration of SARS-CoV-2 viral copies this assay can detect is 138 copies/mL. A negative result does not preclude SARS-Cov-2 infection and should not be used  as the sole basis for treatment or other patient management decisions. A negative result may occur with  improper specimen collection/handling, submission of specimen other than nasopharyngeal swab, presence of viral mutation(s) within the areas targeted by this assay, and inadequate number of viral copies(<138 copies/mL). A negative result must be combined with clinical observations, patient history, and epidemiological information. The expected result is Negative.  Fact Sheet for Patients:  BloggerCourse.com  Fact Sheet for Healthcare Providers:  SeriousBroker.it  This test is no t yet approved or cleared by the [place]  FDA and  has been authorized for detection and/or diagnosis of SARS-CoV-2 by FDA under an Emergency Use Authorization (EUA). This EUA will remain  in effect (meaning this test can be used) for the duration of the COVID-19 declaration under Section 564(b)(1) of the Act, 21 U.S.C.section 360bbb-3(b)(1), unless the authorization is terminated  or revoked sooner.       Influenza A by PCR NEGATIVE NEGATIVE Final   Influenza B by PCR NEGATIVE NEGATIVE Final    Comment: (NOTE) The Xpert Xpress SARS-CoV-2/FLU/RSV plus assay is intended as an aid in the diagnosis of influenza from Nasopharyngeal swab specimens and should not be used as a sole basis for treatment. Nasal washings and aspirates are unacceptable for Xpert Xpress SARS-CoV-2/FLU/RSV testing.  Fact Sheet for Patients: BloggerCourse.com  Fact Sheet for Healthcare Providers: SeriousBroker.it  This test is not yet approved or cleared by the [place]  FDA and has been authorized for detection and/or diagnosis of SARS-CoV-2 by FDA under  an Emergency Use Authorization (EUA). This EUA will remain in effect (meaning this test can be used) for the duration of the COVID-19 declaration under Section 564(b)(1) of the Act, 21 U.S.C. section 360bbb-3(b)(1), unless the authorization is terminated or revoked.     Resp Syncytial Virus by PCR NEGATIVE NEGATIVE Final    Comment: (NOTE) Fact Sheet for Patients: BloggerCourse.com  Fact Sheet for Healthcare Providers: SeriousBroker.it  This test is not yet approved or cleared by the [place]  FDA and has been authorized for detection and/or diagnosis of SARS-CoV-2 by FDA under an Emergency Use Authorization (EUA). This EUA will remain in effect (meaning this test can be used) for the duration of the COVID-19 declaration under Section 564(b)(1) of the Act, 21 U.S.C. section 360bbb-3(b)(1), unless the authorization is terminated or revoked.  Performed at [facility], [address] [place], [place] [address]          Radiology Studies: CT ABDOMEN PELVIS W CONTRAST Result Date: 10/13/2023 CLINICAL DATA:  Diabetic ketoacidosis with abdominal pain EXAM: CT ABDOMEN AND PELVIS WITH CONTRAST TECHNIQUE: Multidetector CT imaging of the abdomen and pelvis was performed using the standard protocol following bolus administration of intravenous contrast. RADIATION DOSE REDUCTION: This exam was performed according to the departmental dose-optimization program which includes automated exposure control, adjustment of the mA and/or kV according to patient size and/or use of iterative reconstruction technique. CONTRAST:  OMNIPAQUE IOHEXOL 300 MG/ML  SOLN COMPARISON:  CT abdomen and pelvis dated 03/06/2023 FINDINGS: Lower chest: No focal consolidation or pulmonary nodule in the lung bases. No pleural effusion or pneumothorax demonstrated. Partially imaged heart size is normal. Hepatobiliary: Unchanged focus of geographic  hypoattenuation along the falciform ligament, likely focal fat. No intra or extrahepatic biliary ductal dilation. Normal gallbladder. Pancreas: Mild heterogeneous hypoenhancement of the pancreas. No main pancreatic ductal dilation or focal mass lesion. Spleen: Normal in size without focal abnormality. Adrenals/Urinary Tract: No adrenal nodules. No suspicious renal mass,  calculi or hydronephrosis. No focal bladder wall thickening. Stomach/Bowel: Normal appearance of the stomach. No evidence of bowel wall thickening, distention, or inflammatory changes. Normal appendix. Vascular/Lymphatic: No significant vascular findings are present. No enlarged abdominal or pelvic lymph nodes. Reproductive: No adnexal masses. 1.0 cm hypodensity in the right uterine cornua (2:79). Other: Small volume intra-abdominal free fluid, predominantly peripancreatic with a small amount in the pelvis. No free air or focal fluid collections. Musculoskeletal: No acute or abnormal lytic or blastic osseous lesions. Right anterior abdominal wall cutaneous thickening and subcutaneous emphysema, likely injection related. IMPRESSION: 1. Findings of acute interstitial edematous pancreatitis. Small volume peripancreatic free fluid. No fluid collection. 2. A 1.0 cm hypodensity in the right uterine cornua may represent a small leiomyoma or adenomyoma. Recommend further evaluation with nonemergent pelvic ultrasound examination. Electronically Signed   By: Limin  Xu M.D.   On: 10/13/2023 13:48   DG Chest Port 1 View Result Date: 10/12/2023 CLINICAL DATA:  Chest pain, tachycardia, tachypnea EXAM: PORTABLE CHEST 1 VIEW COMPARISON:  03/06/2023 FINDINGS: The heart size and mediastinal contours are within normal limits. Both lungs are clear. The visualized skeletal structures are unremarkable. IMPRESSION: No active disease. Electronically Signed   By: Janeece Mechanic M.D.   On: 10/12/2023 21:00           LOS: 1 day   Time spent= 35 mins    Maggie Schooner, MD [facility]  If 7PM-7AM, please contact night-coverage  10/14/2023, 11:26 AM

## 2023-10-14 NOTE — Plan of Care (Signed)
  Problem: Pain Managment: Goal: General experience of comfort will improve and/or be controlled Outcome: Not Progressing   Problem: Education: Goal: Ability to describe self-care measures that may prevent or decrease complications (Diabetes Survival Skills Education) will improve Outcome: Not Progressing Goal: Individualized Educational Video(s) Outcome: Not Progressing   Problem: Metabolic: Goal: Ability to maintain appropriate glucose levels will improve Outcome: Not Progressing   Problem: Education: Goal: Ability to describe self-care measures that may prevent or decrease complications (Diabetes Survival Skills Education) will improve Outcome: Not Progressing Goal: Individualized Educational Video(s) Outcome: Not Progressing

## 2023-10-15 ENCOUNTER — Other Ambulatory Visit (HOSPITAL_COMMUNITY): Payer: Self-pay

## 2023-10-15 DIAGNOSIS — E1010 Type 1 diabetes mellitus with ketoacidosis without coma: Secondary | ICD-10-CM | POA: Diagnosis not present

## 2023-10-15 LAB — BASIC METABOLIC PANEL WITH GFR
Anion gap: 8 (ref 5–15)
BUN: 8 mg/dL (ref 6–20)
CO2: 25 mmol/L (ref 22–32)
Calcium: 8.2 mg/dL — ABNORMAL LOW (ref 8.9–10.3)
Chloride: 105 mmol/L (ref 98–111)
Creatinine, Ser: 0.65 mg/dL (ref 0.44–1.00)
GFR, Estimated: >60 mL/min (ref >60)
Glucose, Bld: 165 mg/dL — ABNORMAL HIGH (ref 70–99)
Potassium: 3.5 mmol/L (ref 3.5–5.1)
Sodium: 138 mmol/L (ref 135–145)

## 2023-10-15 LAB — MAGNESIUM: Magnesium: 1.9 mg/dL (ref 1.7–2.4)

## 2023-10-15 LAB — CBC
HCT: 30.8 % — ABNORMAL LOW (ref 36.0–46.0)
Hemoglobin: 9.7 g/dL — ABNORMAL LOW (ref 12.0–15.0)
MCH: 28.9 pg (ref 26.0–34.0)
MCHC: 31.5 g/dL (ref 30.0–36.0)
MCV: 91.7 fL (ref 80.0–100.0)
Platelets: 247 10*3/uL (ref 150–400)
RBC: 3.36 MIL/uL — ABNORMAL LOW (ref 3.87–5.11)
RDW: 15 % (ref 11.5–15.5)
WBC: 5.7 10*3/uL (ref 4.0–10.5)
nRBC: 0 % (ref 0.0–0.2)

## 2023-10-15 LAB — GLUCOSE, CAPILLARY: Glucose-Capillary: 178 mg/dL — ABNORMAL HIGH (ref 70–99)

## 2023-10-15 MED ORDER — LANCET DEVICE MISC
1.0000 | Freq: Three times a day (TID) | 0 refills | Status: AC
  Filled 2023-10-15 – 2023-11-07 (×2): qty 1, fill #0

## 2023-10-15 MED ORDER — INSULIN GLARGINE 100 UNIT/ML SOLOSTAR PEN
25.0000 [IU] | PEN_INJECTOR | Freq: Every day | SUBCUTANEOUS | 0 refills | Status: AC
  Filled 2023-10-15: qty 15, 60d supply, fill #0

## 2023-10-15 MED ORDER — FERROUS SULFATE 325 (65 FE) MG PO TABS
325.0000 mg | ORAL_TABLET | Freq: Two times a day (BID) | ORAL | 0 refills | Status: AC
  Filled 2023-10-15: qty 60, 30d supply, fill #0

## 2023-10-15 MED ORDER — PANTOPRAZOLE SODIUM 40 MG PO TBEC
40.0000 mg | DELAYED_RELEASE_TABLET | Freq: Every day | ORAL | 0 refills | Status: AC
  Filled 2023-10-15: qty 30, 30d supply, fill #0

## 2023-10-15 MED ORDER — DOCUSATE SODIUM 100 MG PO CAPS
100.0000 mg | ORAL_CAPSULE | Freq: Two times a day (BID) | ORAL | 0 refills | Status: AC | PRN
  Filled 2023-10-15: qty 60, 30d supply, fill #0

## 2023-10-15 MED ORDER — BLOOD GLUCOSE TEST VI STRP
1.0000 | ORAL_STRIP | Freq: Three times a day (TID) | 0 refills | Status: AC
  Filled 2023-10-15 – 2023-11-07 (×2): qty 100, 34d supply, fill #0

## 2023-10-15 MED ORDER — LANCETS MISC
1.0000 | Freq: Three times a day (TID) | 0 refills | Status: AC
  Filled 2023-10-15: qty 100, 33d supply, fill #0
  Filled 2023-11-07: qty 100, 34d supply, fill #0

## 2023-10-15 MED ORDER — PEN NEEDLES 31G X 5 MM MISC
1.0000 | Freq: Three times a day (TID) | 0 refills | Status: AC
  Filled 2023-10-15: qty 100, 33d supply, fill #0

## 2023-10-15 MED ORDER — BLOOD GLUCOSE MONITOR SYSTEM W/DEVICE KIT
1.0000 | PACK | Freq: Three times a day (TID) | 0 refills | Status: AC
  Filled 2023-10-15 – 2023-11-07 (×2): qty 1, 30d supply, fill #0

## 2023-10-15 MED ORDER — LISINOPRIL 20 MG PO TABS
20.0000 mg | ORAL_TABLET | Freq: Every day | ORAL | 0 refills | Status: AC
  Filled 2023-10-15: qty 90, 90d supply, fill #0

## 2023-10-15 MED ORDER — INSULIN LISPRO (1 UNIT DIAL) 100 UNIT/ML (KWIKPEN)
3.0000 [IU] | PEN_INJECTOR | Freq: Three times a day (TID) | SUBCUTANEOUS | 0 refills | Status: AC
  Filled 2023-10-15: qty 15, 30d supply, fill #0

## 2023-10-15 NOTE — Discharge Summary (Signed)
 Physician Discharge Summary  Connie Vaughn BMW:[id number] DOB: [birth year] DOA: 10/12/2023  PCP: Jackelyn Poling, DO  Admit date: 10/12/2023 Discharge date: 10/15/2023  Admitted From: hOME Disposition:  hOME  Recommendations for Outpatient Follow-up:  Follow up with PCP in 1-2 weeks Please obtain BMP/CBC in one week your next doctors visit.  Outpatient follow-up with endocrinology Semglee 25 units daily, Premeal 5 units daily, sliding scale. Counseled to quit drinking alcohol if she is.   Discharge Condition: Stable CODE STATUS: Full code Diet recommendation: DIABETIC  Brief/Interim Summary: Brief Narrative:  48 year old with history of DM1, HLD comes to the hospital with abdominal pain and weakness.  Apparently she has not been on insulin pump for the past week.  Upon admission diagnosed with DKA started on insulin drip/Endo tool.  Eventually patient was transitioned to subcu insulin.  Her acute pancreatitis was also conservatively managed.  Today she is doing significantly well and wishing to go home.  Blood sugars are better controlled as well.   Assessment & Plan:  Principal Problem:   DKA, type 1, not at goal [facility]) Active Problems:   Hyperlipidemia   AKI (acute kidney injury) (HCC)   Lactic acidosis   Abdominal pain   Dehydration   Diabetic ketoacidosis Diabetes mellitus type 1 - DKA has resolved.  A1c 11.4.  Patient has now been transitioned to subcu insulin and sliding scale.  Now she is starting p.o. without any issues. - Current dose Semglee 25 units, sliding scale.  Add 5 units Premeal insulin  Acute kidney injury, secondary to dehydration, resolved Lactic acidosis - Creatinine peaked at 1.62.  Now at baseline 0.6.  Acute uncomplicated pancreatitis - Tolerating p.o. without any issue use.  PPI prescribed upon discharge due to some reflux type of symptoms  Hyperlipidemia -Does not appear to be on home statin.     DVT prophylaxis: SCDs Start: 10/12/23 2208     Code Status: Limited: Do not attempt resuscitation (DNR) -DNR-LIMITED -Do Not Intubate/DNI  Family Communication: Husband at bedside DC today    Subjective: Feels great no complaints.  Wanting to go home   Examination:  General exam: Mild distress abdominal pain Respiratory system: Clear to auscultation. Respiratory effort normal. Cardiovascular system: S1 & S2 heard, RRR. No JVD, murmurs, rubs, gallops or clicks. No pedal edema. Gastrointestinal system: Abdomen is nondistended, soft and nontender. No organomegaly or masses felt. Normal bowel sounds heard. Central nervous system: Alert and oriented. No focal neurological deficits. Extremities: Symmetric 5 x 5 power. Skin: No rashes, lesions or ulcers Psychiatry: Judgement and insight appear normal. Mood & affect appropriate.    Discharge Diagnoses:  Principal Problem:   DKA, type 1, not at goal [facility]) Active Problems:   Hyperlipidemia   AKI (acute kidney injury) (HCC)   Lactic acidosis   Abdominal pain   Dehydration      Discharge Exam: Vitals:   10/14/23 2008 10/15/23 0557  BP: (!) 169/97 (!) 158/92  Pulse: 95 78  Resp: 18   Temp: 98.5 F (36.9 C) 98.1 F (36.7 C)  SpO2: 99% 99%   Vitals:   10/14/23 1100 10/14/23 1144 10/14/23 2008 10/15/23 0557  BP:  (!) 159/89 (!) 169/97 (!) 158/92  Pulse: (!) 101 93 95 78  Resp: (!) 24 18 18    Temp:  98.6 F (37 C) 98.5 F (36.9 C) 98.1 F (36.7 C)  TempSrc:  Oral    SpO2: 97% 100% 99% 99%  Weight:      Height:  Discharge Instructions   Allergies as of 10/15/2023       Reactions   Sulfa Antibiotics Hives        Medication List     STOP taking these medications    Lyumjev KwikPen 100 UNIT/ML KwikPen Generic drug: Insulin Lispro-aabc   Tresiba FlexTouch 100 UNIT/ML FlexTouch Pen Generic drug: insulin degludec       TAKE these medications    b complex vitamins capsule Take 1 capsule by mouth daily.   Blood Glucose Monitor System  w/Device Kit Use to check blood sugar three times daily   docusate sodium 100 MG capsule Commonly known as: COLACE Take 1 capsule (100 mg total) by mouth 2 (two) times daily as needed for mild constipation.   EPINEPHrine 0.3 mg/0.3 mL Soaj injection Commonly known as: EPI-PEN Inject 0.3 mg into the muscle as needed for anaphylaxis.   ferrous sulfate 325 (65 FE) MG tablet Take 1 tablet (325 mg total) by mouth 2 (two) times daily with a meal.   glucose blood test strip Use as instructed What changed: Another medication with the same name was added. Make sure you understand how and when to take each.   BLOOD GLUCOSE TEST STRIPS Strp Use to check blood sugar three times daily What changed: You were already taking a medication with the same name, and this prescription was added. Make sure you understand how and when to take each.   insulin glargine 100 UNIT/ML Solostar Pen Commonly known as: LANTUS Inject 25 Units into the skin daily.   insulin lispro 100 UNIT/ML KwikPen Commonly known as: HUMALOG Inject 3 Units into the skin 3 (three) times daily with meals. If eating and Blood Glucose (BG) 80 or higher inject 5 units for meal coverage and add correction dose per scale. If not eating, correction dose only. BG <150= 0 unit; BG 150-200= 1 unit; BG 201-250= 3 unit; BG 251-300= 5 unit; BG 301-350= 7 unit; BG 351-400= 9 unit; BG >400= 11 unit and Call Primary Care.   Insulin Pen Needle 31G X 5 MM Misc Commonly known as: B-D UF III MINI PEN NEEDLES Use as instructed What changed: Another medication with the same name was added. Make sure you understand how and when to take each.   Pen Needles 31G X 5 MM Misc Use to inject insulin three times daily. What changed: You were already taking a medication with the same name, and this prescription was added. Make sure you understand how and when to take each.   Lancets Misc Use to check blood sugar 3 times daily   lisinopril 20 MG  tablet Commonly known as: ZESTRIL Take 1 tablet (20 mg total) by mouth daily.   OneTouch Delica Plus Lancing Misc 1 each by Does not apply route 3 (three) times daily. May dispense any manufacturer covered by patient's insurance.   pantoprazole 40 MG tablet Commonly known as: Protonix Take 1 tablet (40 mg total) by mouth daily before breakfast.   Vitamin D3 1.25 MG (50000 UT) Caps Take 1 capsule by mouth once a week.        Allergies  Allergen Reactions   Sulfa Antibiotics Hives    You were cared for by a hospitalist during your hospital stay. If you have any questions about your discharge medications or the care you received while you were in the hospital after you are discharged, you can call the unit and asked to speak with the hospitalist on call if the hospitalist that took  care of you is not available. Once you are discharged, your primary care physician will handle any further medical issues. Please note that no refills for any discharge medications will be authorized once you are discharged, as it is imperative that you return to your primary care physician (or establish a relationship with a primary care physician if you do not have one) for your aftercare needs so that they can reassess your need for medications and monitor your lab values.  You were cared for by a hospitalist during your hospital stay. If you have any questions about your discharge medications or the care you received while you were in the hospital after you are discharged, you can call the unit and asked to speak with the hospitalist on call if the hospitalist that took care of you is not available. Once you are discharged, your primary care physician will handle any further medical issues. Please note that NO REFILLS for any discharge medications will be authorized once you are discharged, as it is imperative that you return to your primary care physician (or establish a relationship with a primary care  physician if you do not have one) for your aftercare needs so that they can reassess your need for medications and monitor your lab values.  Please request your Prim.MD to go over all Hospital Tests and Procedure/Radiological results at the follow up, please get all Hospital records sent to your Prim MD by signing hospital release before you go home.  Get CBC, CMP, 2 view Chest X ray checked  by Primary MD during your next visit or SNF MD in 5-7 days ( we routinely change or add medications that can affect your baseline labs and fluid status, therefore we recommend that you get the mentioned basic workup next visit with your PCP, your PCP may decide not to get them or add new tests based on their clinical decision)  On your next visit with your primary care physician please Get Medicines reviewed and adjusted.  If you experience worsening of your admission symptoms, develop shortness of breath, life threatening emergency, suicidal or homicidal thoughts you must seek medical attention immediately by calling 911 or calling your MD immediately  if symptoms less severe.  You Must read complete instructions/literature along with all the possible adverse reactions/side effects for all the Medicines you take and that have been prescribed to you. Take any new Medicines after you have completely understood and accpet all the possible adverse reactions/side effects.   Do not drive, operate heavy machinery, perform activities at heights, swimming or participation in water activities or provide baby sitting services if your were admitted for syncope or siezures until you have seen by Primary MD or a Neurologist and advised to do so again.  Do not drive when taking Pain medications.   Procedures/Studies: CT ABDOMEN PELVIS W CONTRAST Result Date: 10/13/2023 CLINICAL DATA:  Diabetic ketoacidosis with abdominal pain EXAM: CT ABDOMEN AND PELVIS WITH CONTRAST TECHNIQUE: Multidetector CT imaging of the abdomen and  pelvis was performed using the standard protocol following bolus administration of intravenous contrast. RADIATION DOSE REDUCTION: This exam was performed according to the departmental dose-optimization program which includes automated exposure control, adjustment of the mA and/or kV according to patient size and/or use of iterative reconstruction technique. CONTRAST:  OMNIPAQUE IOHEXOL 300 MG/ML  SOLN COMPARISON:  CT abdomen and pelvis dated 03/06/2023 FINDINGS: Lower chest: No focal consolidation or pulmonary nodule in the lung bases. No pleural effusion or pneumothorax demonstrated. Partially imaged  heart size is normal. Hepatobiliary: Unchanged focus of geographic hypoattenuation along the falciform ligament, likely focal fat. No intra or extrahepatic biliary ductal dilation. Normal gallbladder. Pancreas: Mild heterogeneous hypoenhancement of the pancreas. No main pancreatic ductal dilation or focal mass lesion. Spleen: Normal in size without focal abnormality. Adrenals/Urinary Tract: No adrenal nodules. No suspicious renal mass, calculi or hydronephrosis. No focal bladder wall thickening. Stomach/Bowel: Normal appearance of the stomach. No evidence of bowel wall thickening, distention, or inflammatory changes. Normal appendix. Vascular/Lymphatic: No significant vascular findings are present. No enlarged abdominal or pelvic lymph nodes. Reproductive: No adnexal masses. 1.0 cm hypodensity in the right uterine cornua (2:79). Other: Small volume intra-abdominal free fluid, predominantly peripancreatic with a small amount in the pelvis. No free air or focal fluid collections. Musculoskeletal: No acute or abnormal lytic or blastic osseous lesions. Right anterior abdominal wall cutaneous thickening and subcutaneous emphysema, likely injection related. IMPRESSION: 1. Findings of acute interstitial edematous pancreatitis. Small volume peripancreatic free fluid. No fluid collection. 2. A 1.0 cm hypodensity in the  right uterine cornua may represent a small leiomyoma or adenomyoma. Recommend further evaluation with nonemergent pelvic ultrasound examination. Electronically Signed   By: Limin  Xu M.D.   On: 10/13/2023 13:48   DG Chest Port 1 View Result Date: 10/12/2023 CLINICAL DATA:  Chest pain, tachycardia, tachypnea EXAM: PORTABLE CHEST 1 VIEW COMPARISON:  03/06/2023 FINDINGS: The heart size and mediastinal contours are within normal limits. Both lungs are clear. The visualized skeletal structures are unremarkable. IMPRESSION: No active disease. Electronically Signed   By: Janeece Mechanic M.D.   On: 10/12/2023 21:00     The results of significant diagnostics from this hospitalization (including imaging, microbiology, ancillary and laboratory) are listed below for reference.     Microbiology: Recent Results (from the past 240 hours)  Resp panel by RT-PCR (RSV, Flu A&B, Covid)     Status: None   Collection Time: 10/12/23  9:52 PM   Specimen: Nasal Swab  Result Value Ref Range Status   SARS Coronavirus 2 by RT PCR NEGATIVE NEGATIVE Final    Comment: (NOTE) SARS-CoV-2 target nucleic acids are NOT DETECTED.  The SARS-CoV-2 RNA is generally detectable in upper respiratory specimens during the acute phase of infection. The lowest concentration of SARS-CoV-2 viral copies this assay can detect is 138 copies/mL. A negative result does not preclude SARS-Cov-2 infection and should not be used as the sole basis for treatment or other patient management decisions. A negative result may occur with  improper specimen collection/handling, submission of specimen other than nasopharyngeal swab, presence of viral mutation(s) within the areas targeted by this assay, and inadequate number of viral copies(<138 copies/mL). A negative result must be combined with clinical observations, patient history, and epidemiological information. The expected result is Negative.  Fact Sheet for Patients:   BloggerCourse.com  Fact Sheet for Healthcare Providers:  SeriousBroker.it  This test is no t yet approved or cleared by the [place]  FDA and  has been authorized for detection and/or diagnosis of SARS-CoV-2 by FDA under an Emergency Use Authorization (EUA). This EUA will remain  in effect (meaning this test can be used) for the duration of the COVID-19 declaration under Section 564(b)(1) of the Act, 21 U.S.C.section 360bbb-3(b)(1), unless the authorization is terminated  or revoked sooner.       Influenza A by PCR NEGATIVE NEGATIVE Final   Influenza B by PCR NEGATIVE NEGATIVE Final    Comment: (NOTE) The Xpert Xpress SARS-CoV-2/FLU/RSV plus assay is intended as  an aid in the diagnosis of influenza from Nasopharyngeal swab specimens and should not be used as a sole basis for treatment. Nasal washings and aspirates are unacceptable for Xpert Xpress SARS-CoV-2/FLU/RSV testing.  Fact Sheet for Patients: BloggerCourse.com  Fact Sheet for Healthcare Providers: SeriousBroker.it  This test is not yet approved or cleared by the [place] FDA and has been authorized for detection and/or diagnosis of SARS-CoV-2 by FDA under an Emergency Use Authorization (EUA). This EUA will remain in effect (meaning this test can be used) for the duration of the COVID-19 declaration under Section 564(b)(1) of the Act, 21 U.S.C. section 360bbb-3(b)(1), unless the authorization is terminated or revoked.     Resp Syncytial Virus by PCR NEGATIVE NEGATIVE Final    Comment: (NOTE) Fact Sheet for Patients: BloggerCourse.com  Fact Sheet for Healthcare Providers: SeriousBroker.it  This test is not yet approved or cleared by the [place] FDA and has been authorized for detection and/or diagnosis of SARS-CoV-2 by FDA under an Emergency Use  Authorization (EUA). This EUA will remain in effect (meaning this test can be used) for the duration of the COVID-19 declaration under Section 564(b)(1) of the Act, 21 U.S.C. section 360bbb-3(b)(1), unless the authorization is terminated or revoked.  Performed at [facility], [address], [place], [place] [address]      Labs: BNP (last 3 results) No results for input(s): "BNP" in the last 8760 hours. Basic Metabolic Panel: Recent Labs  Lab 10/12/23 2055 10/13/23 0130 10/13/23 1358 10/13/23 1752 10/13/23 2143 10/14/23 0251 10/15/23 0559  NA 137   < > 133* 132* 136 134* 138  K 4.6   < > 3.9 4.1 3.8 4.0 3.5  CL 108   < > 105 106 108 108 105  CO2 10*   < > 16* 19* 20* 20* 25  GLUCOSE 161*   < > 308* 211* 153* 144* 165*  BUN 24*   < > 18 15 12 12 8   CREATININE 1.18*   < > 0.97 0.88 0.78 0.83 0.65  CALCIUM 8.3*   < > 7.7* 7.6* 7.8* 7.6* 8.2*  MG 2.0  --   --   --   --  1.9 1.9  PHOS 3.4  --   --   --   --  2.3*  --    < > = values in this interval not displayed.   Liver Function Tests: Recent Labs  Lab 10/12/23 1426  AST 22  ALT 19  ALKPHOS 106  BILITOT 3.0*  PROT 6.6  ALBUMIN 3.1*   Recent Labs  Lab 10/12/23 1426  LIPASE 139*   No results for input(s): "AMMONIA" in the last 168 hours. CBC: Recent Labs  Lab 10/12/23 1426 10/14/23 0251 10/15/23 0559  WBC 12.0* 7.5 5.7  NEUTROABS 8.8*  --   --   HGB 11.4* 8.8* 9.7*  HCT 38.3 28.8* 30.8*  MCV 97.5 94.7 91.7  PLT 359 255 247   Cardiac Enzymes: Recent Labs  Lab 10/12/23 1853 10/12/23 2055  CKTOTAL 112 138   BNP: Invalid input(s): "POCBNP" CBG: Recent Labs  Lab 10/14/23 0725 10/14/23 1129 10/14/23 1618 10/14/23 2009 10/15/23 0431  GLUCAP 118* 116* 108* 102* 178*   D-Dimer No results for input(s): "DDIMER" in the last 72 hours. Hgb A1c Recent Labs    10/12/23 2055  HGBA1C 11.4*   Lipid Profile No results for input(s): "CHOL", "HDL", "LDLCALC", "TRIG", "CHOLHDL",  "LDLDIRECT" in the last 72 hours. Thyroid function studies Recent  Labs    10/12/23 2055  TSH 1.696   Anemia work up Recent Labs    10/12/23 2055 10/13/23 0623  VITAMINB12  --  380  FOLATE  --  11.0  FERRITIN 18  --   TIBC 419  --   IRON 22*  --   RETICCTPCT 1.3  --    Urinalysis    Component Value Date/Time   COLORURINE STRAW (A) 10/12/2023 2213   APPEARANCEUR CLEAR 10/12/2023 2213   LABSPEC 1.014 10/12/2023 2213   PHURINE 5.0 10/12/2023 2213   GLUCOSEU >=500 (A) 10/12/2023 2213   HGBUR SMALL (A) 10/12/2023 2213   BILIRUBINUR NEGATIVE 10/12/2023 2213   KETONESUR 80 (A) 10/12/2023 2213   PROTEINUR 100 (A) 10/12/2023 2213   NITRITE NEGATIVE 10/12/2023 2213   LEUKOCYTESUR NEGATIVE 10/12/2023 2213   Sepsis Labs Recent Labs  Lab 10/12/23 1426 10/14/23 0251 10/15/23 0559  WBC 12.0* 7.5 5.7   Microbiology Recent Results (from the past 240 hours)  Resp panel by RT-PCR (RSV, Flu A&B, Covid)     Status: None   Collection Time: 10/12/23  9:52 PM   Specimen: Nasal Swab  Result Value Ref Range Status   SARS Coronavirus 2 by RT PCR NEGATIVE NEGATIVE Final    Comment: (NOTE) SARS-CoV-2 target nucleic acids are NOT DETECTED.  The SARS-CoV-2 RNA is generally detectable in upper respiratory specimens during the acute phase of infection. The lowest concentration of SARS-CoV-2 viral copies this assay can detect is 138 copies/mL. A negative result does not preclude SARS-Cov-2 infection and should not be used as the sole basis for treatment or other patient management decisions. A negative result may occur with  improper specimen collection/handling, submission of specimen other than nasopharyngeal swab, presence of viral mutation(s) within the areas targeted by this assay, and inadequate number of viral copies(<138 copies/mL). A negative result must be combined with clinical observations, patient history, and epidemiological information. The expected result is  Negative.  Fact Sheet for Patients:  BloggerCourse.com  Fact Sheet for Healthcare Providers:  SeriousBroker.it  This test is no t yet approved or cleared by the [place]  FDA and  has been authorized for detection and/or diagnosis of SARS-CoV-2 by FDA under an Emergency Use Authorization (EUA). This EUA will remain  in effect (meaning this test can be used) for the duration of the COVID-19 declaration under Section 564(b)(1) of the Act, 21 U.S.C.section 360bbb-3(b)(1), unless the authorization is terminated  or revoked sooner.       Influenza A by PCR NEGATIVE NEGATIVE Final   Influenza B by PCR NEGATIVE NEGATIVE Final    Comment: (NOTE) The Xpert Xpress SARS-CoV-2/FLU/RSV plus assay is intended as an aid in the diagnosis of influenza from Nasopharyngeal swab specimens and should not be used as a sole basis for treatment. Nasal washings and aspirates are unacceptable for Xpert Xpress SARS-CoV-2/FLU/RSV testing.  Fact Sheet for Patients: BloggerCourse.com  Fact Sheet for Healthcare Providers: SeriousBroker.it  This test is not yet approved or cleared by the [place]  FDA and has been authorized for detection and/or diagnosis of SARS-CoV-2 by FDA under an Emergency Use Authorization (EUA). This EUA will remain in effect (meaning this test can be used) for the duration of the COVID-19 declaration under Section 564(b)(1) of the Act, 21 U.S.C. section 360bbb-3(b)(1), unless the authorization is terminated or revoked.     Resp Syncytial Virus by PCR NEGATIVE NEGATIVE Final    Comment: (NOTE) Fact Sheet for Patients: BloggerCourse.com  Fact Sheet for Healthcare Providers:  SeriousBroker.it  This test is not yet approved or cleared by the [place]  FDA and has been authorized for detection and/or diagnosis of  SARS-CoV-2 by FDA under an Emergency Use Authorization (EUA). This EUA will remain in effect (meaning this test can be used) for the duration of the COVID-19 declaration under Section 564(b)(1) of the Act, 21 U.S.C. section 360bbb-3(b)(1), unless the authorization is terminated or revoked.  Performed at [facility], [address], [place], [place] [address]      Time coordinating discharge:  I have spent 35 minutes face to face with the patient and on the ward discussing the patients care, assessment, plan and disposition with other care givers. >50% of the time was devoted counseling the patient about the risks and benefits of treatment/Discharge disposition and coordinating care.   SIGNED:   Maggie Schooner, MD  [facility] 10/15/2023, 12:00 PM   If 7PM-7AM, please contact night-coverage

## 2023-10-15 NOTE — Progress Notes (Signed)
 PROGRESS NOTE    Connie Vaughn  WUJ:[id number] DOB: 1976-03-24 DOA: 10/12/2023 PCP: Jackelyn Poling, DO    Brief Narrative:  48 year old with history of DM1, HLD comes to the hospital with abdominal pain and weakness.  Apparently she has not been on insulin pump for the past week.  Upon admission diagnosed with DKA started on insulin drip/Endo tool.  Eventually patient was transitioned to subcu insulin.  Her acute pancreatitis was also conservatively managed.  Today she is doing significantly well and wishing to go home.  Blood sugars are better controlled as well.   Assessment & Plan:  Principal Problem:   DKA, type 1, not at goal [facility]) Active Problems:   Hyperlipidemia   AKI (acute kidney injury) (HCC)   Lactic acidosis   Abdominal pain   Dehydration   Diabetic ketoacidosis Diabetes mellitus type 1 - DKA has resolved.  A1c 11.4.  Patient has now been transitioned to subcu insulin and sliding scale.  Now she is starting p.o. without any issues. - Current dose Semglee 25 units, sliding scale.  Add 5 units Premeal insulin  Acute kidney injury, secondary to dehydration, resolved Lactic acidosis - Creatinine peaked at 1.62.  Now at baseline 0.6.  Acute uncomplicated pancreatitis - Tolerating p.o. without any issue use.  PPI prescribed upon discharge due to some reflux type of symptoms  Hyperlipidemia -Does not appear to be on home statin.     DVT prophylaxis: SCDs Start: 10/12/23 2208    Code Status: Limited: Do not attempt resuscitation (DNR) -DNR-LIMITED -Do Not Intubate/DNI  Family Communication: Husband at bedside DC today    Subjective: Feels great no complaints.  Wanting to go home   Examination:  General exam: Mild distress abdominal pain Respiratory system: Clear to auscultation. Respiratory effort normal. Cardiovascular system: S1 & S2 heard, RRR. No JVD, murmurs, rubs, gallops or clicks. No pedal edema. Gastrointestinal system: Abdomen is nondistended, soft  and nontender. No organomegaly or masses felt. Normal bowel sounds heard. Central nervous system: Alert and oriented. No focal neurological deficits. Extremities: Symmetric 5 x 5 power. Skin: No rashes, lesions or ulcers Psychiatry: Judgement and insight appear normal. Mood & affect appropriate.                Diet Orders (From admission, onward)     Start     Ordered   10/15/23 1036  Diet Carb Modified Fluid consistency: Nectar Thick  Diet effective now       Question Answer Comment  Calorie Level Medium 1600-2000   Fluid consistency: Nectar Thick      10/15/23 1035            Objective: Vitals:   10/14/23 1100 10/14/23 1144 10/14/23 2008 10/15/23 0557  BP:  (!) 159/89 (!) 169/97 (!) 158/92  Pulse: (!) 101 93 95 78  Resp: (!) 24 18 18    Temp:  98.6 F (37 C) 98.5 F (36.9 C) 98.1 F (36.7 C)  TempSrc:  Oral    SpO2: 97% 100% 99% 99%  Weight:      Height:       No intake or output data in the 24 hours ending 10/15/23 1200 Filed Weights   10/12/23 1402 10/12/23 1956  Weight: 59 kg 60.9 kg    Scheduled Meds:  Chlorhexidine Gluconate Cloth  6 each Topical Daily   docusate sodium  100 mg Oral BID   ferrous sulfate  325 mg Oral BID WC   insulin aspart  0-5 Units Subcutaneous  QHS   insulin aspart  0-9 Units Subcutaneous TID WC   insulin glargine-yfgn  25 Units Subcutaneous Daily   pantoprazole (PROTONIX) IV  40 mg Intravenous Q12H   Continuous Infusions:  Nutritional status     Body mass index is 23.05 kg/m.  Data Reviewed:   CBC: Recent Labs  Lab 10/12/23 1426 10/14/23 0251 10/15/23 0559  WBC 12.0* 7.5 5.7  NEUTROABS 8.8*  --   --   HGB 11.4* 8.8* 9.7*  HCT 38.3 28.8* 30.8*  MCV 97.5 94.7 91.7  PLT 359 255 247   Basic Metabolic Panel: Recent Labs  Lab 10/12/23 2055 10/13/23 0130 10/13/23 1358 10/13/23 1752 10/13/23 2143 10/14/23 0251 10/15/23 0559  NA 137   < > 133* 132* 136 134* 138  K 4.6   < > 3.9 4.1 3.8 4.0 3.5  CL 108    < > 105 106 108 108 105  CO2 10*   < > 16* 19* 20* 20* 25  GLUCOSE 161*   < > 308* 211* 153* 144* 165*  BUN 24*   < > 18 15 12 12 8   CREATININE 1.18*   < > 0.97 0.88 0.78 0.83 0.65  CALCIUM 8.3*   < > 7.7* 7.6* 7.8* 7.6* 8.2*  MG 2.0  --   --   --   --  1.9 1.9  PHOS 3.4  --   --   --   --  2.3*  --    < > = values in this interval not displayed.   GFR: Estimated Creatinine Clearance: 75.1 mL/min (by C-G formula based on SCr of 0.65 mg/dL). Liver Function Tests: Recent Labs  Lab 10/12/23 1426  AST 22  ALT 19  ALKPHOS 106  BILITOT 3.0*  PROT 6.6  ALBUMIN 3.1*   Recent Labs  Lab 10/12/23 1426  LIPASE 139*   No results for input(s): "AMMONIA" in the last 168 hours. Coagulation Profile: Recent Labs  Lab 10/12/23 2055  INR 1.0   Cardiac Enzymes: Recent Labs  Lab 10/12/23 1853 10/12/23 2055  CKTOTAL 112 138   BNP (last 3 results) No results for input(s): "PROBNP" in the last 8760 hours. HbA1C: Recent Labs    10/12/23 2055  HGBA1C 11.4*   CBG: Recent Labs  Lab 10/14/23 0725 10/14/23 1129 10/14/23 1618 10/14/23 2009 10/15/23 0431  GLUCAP 118* 116* 108* 102* 178*   Lipid Profile: No results for input(s): "CHOL", "HDL", "LDLCALC", "TRIG", "CHOLHDL", "LDLDIRECT" in the last 72 hours. Thyroid Function Tests: Recent Labs    10/12/23 2055  TSH 1.696   Anemia Panel: Recent Labs    10/12/23 2055 10/13/23 0623  VITAMINB12  --  380  FOLATE  --  11.0  FERRITIN 18  --   TIBC 419  --   IRON 22*  --   RETICCTPCT 1.3  --    Sepsis Labs: Recent Labs  Lab 10/12/23 1433 10/12/23 1649 10/12/23 1853 10/12/23 2055  PROCALCITON  --   --   --  0.29  LATICACIDVEN 1.4 2.3* 5.6* 3.3*    Recent Results (from the past 240 hours)  Resp panel by RT-PCR (RSV, Flu A&B, Covid)     Status: None   Collection Time: 10/12/23  9:52 PM   Specimen: Nasal Swab  Result Value Ref Range Status   SARS Coronavirus 2 by RT PCR NEGATIVE NEGATIVE Final    Comment:  (NOTE) SARS-CoV-2 target nucleic acids are NOT DETECTED.  The SARS-CoV-2 RNA is generally detectable in  upper respiratory specimens during the acute phase of infection. The lowest concentration of SARS-CoV-2 viral copies this assay can detect is 138 copies/mL. A negative result does not preclude SARS-Cov-2 infection and should not be used as the sole basis for treatment or other patient management decisions. A negative result may occur with  improper specimen collection/handling, submission of specimen other than nasopharyngeal swab, presence of viral mutation(s) within the areas targeted by this assay, and inadequate number of viral copies(<138 copies/mL). A negative result must be combined with clinical observations, patient history, and epidemiological information. The expected result is Negative.  Fact Sheet for Patients:  BloggerCourse.com  Fact Sheet for Healthcare Providers:  SeriousBroker.it  This test is no t yet approved or cleared by the [place]  FDA and  has been authorized for detection and/or diagnosis of SARS-CoV-2 by FDA under an Emergency Use Authorization (EUA). This EUA will remain  in effect (meaning this test can be used) for the duration of the COVID-19 declaration under Section 564(b)(1) of the Act, 21 U.S.C.section 360bbb-3(b)(1), unless the authorization is terminated  or revoked sooner.       Influenza A by PCR NEGATIVE NEGATIVE Final   Influenza B by PCR NEGATIVE NEGATIVE Final    Comment: (NOTE) The Xpert Xpress SARS-CoV-2/FLU/RSV plus assay is intended as an aid in the diagnosis of influenza from Nasopharyngeal swab specimens and should not be used as a sole basis for treatment. Nasal washings and aspirates are unacceptable for Xpert Xpress SARS-CoV-2/FLU/RSV testing.  Fact Sheet for Patients: BloggerCourse.com  Fact Sheet for Healthcare  Providers: SeriousBroker.it  This test is not yet approved or cleared by the [place]  FDA and has been authorized for detection and/or diagnosis of SARS-CoV-2 by FDA under an Emergency Use Authorization (EUA). This EUA will remain in effect (meaning this test can be used) for the duration of the COVID-19 declaration under Section 564(b)(1) of the Act, 21 U.S.C. section 360bbb-3(b)(1), unless the authorization is terminated or revoked.     Resp Syncytial Virus by PCR NEGATIVE NEGATIVE Final    Comment: (NOTE) Fact Sheet for Patients: BloggerCourse.com  Fact Sheet for Healthcare Providers: SeriousBroker.it  This test is not yet approved or cleared by the [place]  FDA and has been authorized for detection and/or diagnosis of SARS-CoV-2 by FDA under an Emergency Use Authorization (EUA). This EUA will remain in effect (meaning this test can be used) for the duration of the COVID-19 declaration under Section 564(b)(1) of the Act, 21 U.S.C. section 360bbb-3(b)(1), unless the authorization is terminated or revoked.  Performed at [facility], [address], [place], [place] [address]          Radiology Studies: No results found.         LOS: 2 days   Time spent= 35 mins    Maggie Schooner, MD [facility]  If 7PM-7AM, please contact night-coverage  10/15/2023, 12:00 PM

## 2023-10-15 NOTE — Plan of Care (Signed)

## 2023-10-15 NOTE — Progress Notes (Signed)
 Patient no longer in room at 1150- call to patient to confirm she had left- D/C order in place. Pt was waiting on a change in one of her home meds. Pt stated" I wanted to leave and eat" Pt confirmed she removed her PIV prior to leaving,tele box left on bed, cleaned and put away. Pt states " I can see AVS on my chart" This RN reviewed that med changes were updated and that pt would pick up meds after eating - [facility] pharmacy notified via secure chat. Primary RN & charge RN also aware, they had notified this RN of pt leaving.

## 2023-10-16 ENCOUNTER — Other Ambulatory Visit (HOSPITAL_COMMUNITY): Payer: Self-pay

## 2023-10-27 NOTE — Progress Notes (Signed)
 [facility] - Clinic Note  11/02/2023   CHIEF COMPLAINT Patient presents for Retina Evaluation  HISTORY OF PRESENT ILLNESS: Connie Vaughn is a 48 y.o. female who presents to the clinic today for:  HPI     Retina Evaluation   In both eyes.  This started 2 months ago.  Duration of 2 months.  Associated Symptoms Floaters.  Context:  distance vision and mid-range vision.  I, the attending physician,  performed the HPI with the patient and updated documentation appropriately.        Comments   Retina eval per Dr Micael Adas NPDR OU pt is reporting uncontrolled glycemic for the last several months. Just recently she was running around 700. This morning she was 197. Patient has noticed decreased vision and reports some floaters.      Last edited by Ronelle Coffee, MD on 11/02/2023  8:42 PM.    Pt is here on the referral of Dr. Micael Adas for concern of NPDR OU, pt saw Dr. Augustus Ledger several times a few years ago, pt states she is seeing a lot of floaters in her left eye, more than she has ever seen before, she states she started seeing them last year, but now she feels like she can't see out of the left eye, she states that eye feels heavier and the vision get blurrier when her sugar is high, her last A1c was 11.4 on 04.14.25 and she had an episode of DKA 3 weeks ago, she is type 1 diabetic and is on insulin    Referring physician: [place], [place], OD [address] [place],  [place] [address]  HISTORICAL INFORMATION:  Selected notes from the MEDICAL RECORD NUMBER Referred by Dr. Micael Adas for concern of NPDR  LEE:  Ocular Hx-  Previously followed w/ [facility] -- last visit 09.26.2022 history of focal laser OU w/ [facility] -- OD 09.03.2020, 09.06.2022; OS 09.14.22  PMH-   CURRENT MEDICATIONS: No current outpatient medications on file. (Ophthalmic Drugs)   No current facility-administered medications for this visit. (Ophthalmic Drugs)   Current Outpatient Medications (Other)   Medication Sig   b complex vitamins capsule Take 1 capsule by mouth daily.   Blood Glucose Monitoring Suppl (BLOOD GLUCOSE MONITOR SYSTEM) w/Device KIT Use to check blood sugar three times daily   Cholecalciferol (VITAMIN D3) 1.25 MG (50000 UT) CAPS Take 1 capsule by mouth once a week.   docusate sodium  (COLACE) 100 MG capsule Take 1 capsule (100 mg total) by mouth 2 (two) times daily as needed for mild constipation.   EPINEPHrine 0.3 mg/0.3 mL IJ SOAJ injection Inject 0.3 mg into the muscle as needed for anaphylaxis.   ferrous sulfate  325 (65 FE) MG tablet Take 1 tablet (325 mg total) by mouth 2 (two) times daily with a meal.   Glucose Blood (BLOOD GLUCOSE TEST STRIPS) STRP Use to check blood sugar three times daily   glucose blood test strip Use as instructed (Patient taking differently: 1 each by Other route in the morning, at noon, and at bedtime. Use as instructed)   insulin  glargine (LANTUS ) 100 UNIT/ML Solostar Pen Inject 25 Units into the skin daily.   insulin  lispro (HUMALOG ) 100 UNIT/ML KwikPen Inject 3 Units into the skin 3 (three) times daily with meals. If eating and Blood Glucose (BG) 80 or higher inject 5 units for meal coverage and add correction dose per scale. If not eating, correction dose only. BG <150= 0 unit; BG 150-200= 1 unit; BG 201-250= 3  unit; BG 251-300= 5 unit; BG 301-350= 7 unit; BG 351-400= 9 unit; BG >400= 11 unit and Call Primary Care.   Insulin  Pen Needle (B-D UF III MINI PEN NEEDLES) 31G X 5 MM MISC Use as instructed (Patient taking differently: 1 each by Other route in the morning, at noon, and at bedtime. Use as instructed)   Insulin  Pen Needle (PEN NEEDLES) 31G X 5 MM MISC Use to inject insulin  three times daily.   Lancet Device MISC 1 each by Does not apply route 3 (three) times daily. May dispense any manufacturer covered by patient's insurance.   Lancets MISC Use to check blood sugar 3 times daily   lisinopril  (ZESTRIL ) 20 MG tablet Take 1 tablet (20 mg  total) by mouth daily.   pantoprazole  (PROTONIX ) 40 MG tablet Take 1 tablet (40 mg total) by mouth daily before breakfast.   No current facility-administered medications for this visit. (Other)   REVIEW OF SYSTEMS: ROS   Positive for: Endocrine, Eyes Negative for: Constitutional, Gastrointestinal, Neurological, Skin, Genitourinary, Musculoskeletal, HENT, Cardiovascular, Respiratory, Psychiatric, Allergic/Imm, Heme/Lymph Last edited by Kriste Petite, COT on 11/02/2023  9:28 AM.     ALLERGIES Allergies  Allergen Reactions   Sulfa Antibiotics Hives   PAST MEDICAL HISTORY Past Medical History:  Diagnosis Date   ADHD (attention deficit hyperactivity disorder)    AKI (acute kidney injury) (HCC) 10/06/2020   Anemia    Blood transfusion without reported diagnosis    Constipation    Diabetes mellitus type 1, uncontrolled    DKA (diabetic ketoacidosis) (HCC) 10/06/2020   GERD (gastroesophageal reflux disease)    Hyperlipidemia    Incontinence, feces    Past Surgical History:  Procedure Laterality Date   ANAL RECTAL MANOMETRY N/A 07/29/2023   Procedure: ANO RECTAL MANOMETRY;  Surgeon: Sergio Dandy, MD;  Location: [facility];  Service: Gastroenterology;  Laterality: N/A;   I & D EXTREMITY Right 03/09/2021   Procedure: IRRIGATION AND DEBRIDEMENT RIGHT RING FINGER;  Surgeon: Brunilda Capra, MD;  Location: [facility] OR;  Service: Orthopedics;  Laterality: Right;   TUBAL LIGATION     FAMILY HISTORY Family History  Problem Relation Age of Onset   Heart disease Mother    Congestive Heart Failure Mother    Congestive Heart Failure Sister    Diabetes Maternal Grandmother    Colon polyps Neg Hx    Crohn's disease Neg Hx    Esophageal cancer Neg Hx    Rectal cancer Neg Hx    Stomach cancer Neg Hx    SOCIAL HISTORY Social History   Tobacco Use   Smoking status: Some Days    Current packs/day: 0.25    Average packs/day: 0.3 packs/day for 2.0 years (0.5 ttl pk-yrs)    Types:  Cigarettes   Smokeless tobacco: Never   Tobacco comments:    also uses Black and Mild  Vaping Use   Vaping status: Never Used  Substance Use Topics   Alcohol use: Never   Drug use: Yes    Comment: CBD edibles       OPHTHALMIC EXAM:  Base Eye Exam     Visual Acuity (Snellen - Linear)       Right Left   Dist [place] 20/20 -2 20/40 -2   Dist ph [place]  20/30         Tonometry (Tonopen, 8:20 AM)       Right Left   Pressure 16 16         Pupils  Dark Light Shape React APD   Right 4 3 Round Brisk None   Left 4 3 Round Brisk None         Visual Fields       Left Right    Full Full         Extraocular Movement       Right Left    Full, Ortho Full, Ortho         Dilation     Both eyes: 2.5% Phenylephrine  @ 8:20 AM           Slit Lamp and Fundus Exam     Slit Lamp Exam       Right Left   Lids/Lashes Dermatochalasis - upper lid Dermatochalasis - upper lid   Conjunctiva/Sclera mild melanosis mild melanosis   Cornea Clear Clear   Anterior Chamber deep and clear deep and clear   Iris Round and dilated, No NVI Round and dilated, No NVI   Lens 1-2+ Nuclear sclerosis, 1-2+ Cortical cataract 1-2+ Nuclear sclerosis, 1-2+ Cortical cataract   Anterior Vitreous mild syneresis mild syneresis, blood stained vitreous condensations greatest inferiorly         Fundus Exam       Right Left   Disc Pink and Sharp, fine vascular, ?early NVD Pink and Sharp, +fibrosis, +NVD   C/D Ratio 0.2 0.3   Macula Flat, Good foveal reflex, scattered MA / DBH, exudates and cystic changes, scattered focal laser changes Flat, Blunted foveal reflex, ERM with striae, scatterd MA / DBH, exudates and cystic changes, scattered focal laser changes   Vessels attenuated, mild tortuosity, ?IRMA attenuated, Tortuous, +NV greatest nasal to disc   Periphery Attached, MA / DBH Attached, inferior periphery partially obscured by VH, scattered MA / DBH, fibrotic NV nasal to disc            IMAGING AND PROCEDURES  Imaging and Procedures for 11/02/2023  OCT, Retina - OU - Both Eyes       Right Eye Quality was good. Central Foveal Thickness: 268. Progression has worsened. Findings include normal foveal contour, no SRF, intraretinal hyper-reflective material, intraretinal fluid, outer retinal atrophy, vitreomacular adhesion (Focal cystic changes and IRHM temporal macula -- increased from prior).   Left Eye Quality was good. Central Foveal Thickness: 313. Progression has worsened. Findings include no SRF, abnormal foveal contour, subretinal hyper-reflective material, epiretinal membrane, intraretinal fluid, outer retinal atrophy, preretinal fibrosis (+vitreous opacities; ERM with pucker).   Notes *Images captured and stored on drive  Diagnosis / Impression:  Mild DME OU OD: Focal cystic changes and IRHM temporal macula -- increased from prior OS: +vitreous opacities; ERM with pucker; +preretinal fibrosis  Clinical management:  See below  Abbreviations: NFP - Normal foveal profile. CME - cystoid macular edema. PED - pigment epithelial detachment. IRF - intraretinal fluid. SRF - subretinal fluid. EZ - ellipsoid zone. ERM - epiretinal membrane. ORA - outer retinal atrophy. ORT - outer retinal tubulation. SRHM - subretinal hyper-reflective material. IRHM - intraretinal hyper-reflective material           ASSESSMENT/PLAN:   ICD-10-CM   1. 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 Current use of insulin  (HCC)  Z79.4  4. Vitreous hemorrhage of left eye (HCC)  H43.12     5. Essential hypertension  I10     6. Hypertensive retinopathy of both eyes  H35.033     7. Nuclear sclerosis of both  eyes  H25.13      1-4. Proliferative diabetic retinopathy, left eye        Severe NPDR, right eye  - previously managed by [facility], but lost to retina f/u since 09.26.2022  - h/o focal laser OU by [facility] -- OD 09.03.2020, 09.06.2022; OS 09.14.22  - pt referred here by Dr. Micael Adas  - pt reports 1 yr history of waxing/waning floaters OS, floaters more persistent OS last several months - The incidence, risk factors for progression, natural history and treatment options for diabetic retinopathy were discussed with patient.   - The need for close monitoring of blood glucose, blood pressure, and serum lipids, avoiding cigarette or any type of tobacco, and the need for long term follow up was also discussed with patient. - exam shows scattered MA/DBH OU; +NV w/ fibrosis OS - unable to obtain FA today - OCT shows diabetic macular edema, both eyes  - The natural history, pathology, and characteristics of diabetic macular edema discussed with patient.  A generalized discussion of the major clinical trials concerning treatment of diabetic macular edema (ETDRS, DCT, SCORE, RISE / RIDE, and ongoing DRCR net studies) was completed.  This discussion included mention of the various approaches to treating diabetic macular edema (observation, laser photocoagulation, anti-VEGF injections with lucentis / Avastin / Eylea, steroid injections with Kenalog / Ozurdex , and intraocular surgery with vitrectomy).  The goal hemoglobin A1C of 6-7 was discussed, as well as importance of smoking cessation and hypertension control.  Need for ongoing treatment and monitoring were specifically discussed with reference to chronic nature of diabetic macular edema. - BCVA OD 20/20; OS 20/30 - recommend IVA OS #1 today, 05.05.25 for PDR and VH - pt wishes to proceed but injection requires prior authorization - f/u in 1 wk -- DFE/OCT, possible injection  5,6. Hypertensive retinopathy OU  - BP today (05.05.25) 166/108 - discussed importance  of tight BP control - monitor  7. Nuclear sclerosis OU - The symptoms of cataract, surgical options, and treatments and risks were discussed with patient. - discussed diagnosis and progression - monitor    Ophthalmic Meds Ordered this visit:  No orders of the defined types were placed in this encounter.    Return in about 1 week (around 11/09/2023) for f/u PDR OU, DFE, OCT, Possible Injxn.  There are no Patient Instructions on file for this visit.  Explained the diagnoses, plan, and follow up with the patient and they expressed understanding.  Patient expressed understanding of the importance of proper follow up care.   This document serves as a record of services personally performed by Jeanice Millard, MD, PhD. It was created on their behalf by Morley Arabia. Bevin Bucks, OA an ophthalmic technician. The creation of this record is the provider's dictation and/or activities during the visit.    Electronically signed by: Morley Arabia. Bevin Bucks, OA 11/02/23 9:54 PM  Jeanice Millard, M.D., Ph.D. Diseases & Surgery of the Retina and Vitreous [facility] 11/02/2023  I have reviewed the above documentation for accuracy and completeness, and I agree with the above. Jeanice Millard, M.D., Ph.D. 11/02/23 9:54 PM   Abbreviations: M myopia (nearsighted); A astigmatism; H hyperopia (farsighted); P presbyopia; Mrx spectacle prescription;  CTL contact lenses; OD  right eye; OS left eye; OU both eyes  XT exotropia; ET esotropia; PEK punctate epithelial keratitis; PEE punctate epithelial erosions; DES dry eye syndrome; MGD meibomian gland dysfunction; ATs artificial tears; PFAT's preservative free artificial tears; NSC nuclear sclerotic cataract; PSC posterior subcapsular cataract; ERM epi-retinal membrane; PVD posterior vitreous detachment; RD retinal detachment; DM diabetes mellitus; DR diabetic retinopathy; NPDR non-proliferative diabetic retinopathy; PDR proliferative diabetic retinopathy; CSME  clinically significant macular edema; DME diabetic macular edema; dbh dot blot hemorrhages; CWS cotton wool spot; POAG primary open angle glaucoma; C/D cup-to-disc ratio; HVF humphrey visual field; GVF goldmann visual field; OCT optical coherence tomography; IOP intraocular pressure; BRVO Branch retinal vein occlusion; CRVO central retinal vein occlusion; CRAO central retinal artery occlusion; BRAO branch retinal artery occlusion; RT retinal tear; SB scleral buckle; PPV pars plana vitrectomy; VH Vitreous hemorrhage; PRP panretinal laser photocoagulation; IVK intravitreal kenalog; VMT vitreomacular traction; MH Macular hole;  NVD neovascularization of the disc; NVE neovascularization elsewhere; AREDS age related eye disease study; ARMD age related macular degeneration; POAG primary open angle glaucoma; EBMD epithelial/anterior basement membrane dystrophy; ACIOL anterior chamber intraocular lens; IOL intraocular lens; PCIOL posterior chamber intraocular lens; Phaco/IOL phacoemulsification with intraocular lens placement; PRK photorefractive keratectomy; LASIK laser assisted in situ keratomileusis; HTN hypertension; DM diabetes mellitus; COPD chronic obstructive pulmonary disease

## 2023-11-02 ENCOUNTER — Ambulatory Visit (INDEPENDENT_AMBULATORY_CARE_PROVIDER_SITE_OTHER): Admitting: Ophthalmology

## 2023-11-02 ENCOUNTER — Encounter (INDEPENDENT_AMBULATORY_CARE_PROVIDER_SITE_OTHER): Payer: Self-pay | Admitting: Ophthalmology

## 2023-11-02 VITALS — BP 166/105 | HR 83

## 2023-11-02 DIAGNOSIS — E103411 Type 1 diabetes mellitus with severe nonproliferative diabetic retinopathy with macular edema, right eye: Secondary | ICD-10-CM

## 2023-11-02 DIAGNOSIS — H3581 Retinal edema: Secondary | ICD-10-CM

## 2023-11-02 DIAGNOSIS — E103512 Type 1 diabetes mellitus with proliferative diabetic retinopathy with macular edema, left eye: Secondary | ICD-10-CM | POA: Diagnosis not present

## 2023-11-02 DIAGNOSIS — H4312 Vitreous hemorrhage, left eye: Secondary | ICD-10-CM

## 2023-11-02 DIAGNOSIS — H35033 Hypertensive retinopathy, bilateral: Secondary | ICD-10-CM

## 2023-11-02 DIAGNOSIS — I10 Essential (primary) hypertension: Secondary | ICD-10-CM

## 2023-11-02 DIAGNOSIS — Z794 Long term (current) use of insulin: Secondary | ICD-10-CM | POA: Diagnosis not present

## 2023-11-02 DIAGNOSIS — H2513 Age-related nuclear cataract, bilateral: Secondary | ICD-10-CM

## 2023-11-07 ENCOUNTER — Other Ambulatory Visit (HOSPITAL_COMMUNITY): Payer: Self-pay

## 2023-11-09 ENCOUNTER — Encounter (INDEPENDENT_AMBULATORY_CARE_PROVIDER_SITE_OTHER): Admitting: Ophthalmology

## 2023-11-09 ENCOUNTER — Other Ambulatory Visit: Payer: Self-pay

## 2023-11-11 ENCOUNTER — Other Ambulatory Visit (HOSPITAL_COMMUNITY): Payer: Self-pay | Admitting: Family Medicine

## 2023-11-11 ENCOUNTER — Other Ambulatory Visit (HOSPITAL_COMMUNITY): Payer: Self-pay

## 2023-11-12 NOTE — Progress Notes (Signed)
 [facility] - Clinic Note  11/13/2023   CHIEF COMPLAINT Patient presents for Retina Follow Up  HISTORY OF PRESENT ILLNESS: Connie Vaughn is a 48 y.o. female who presents to the clinic today for:  HPI     Retina Follow Up   Patient presents with  Diabetic Retinopathy.  In left eye.  Severity is moderate.  Duration of 1 week.  Since onset it is stable.  I, the attending physician,  performed the HPI with the patient and updated documentation appropriately.        Comments   Pt here for 1 wk ret f/u PDR OS. Pt states VA is the same, here for IVA OS. No changes.       Last edited by Ronelle Coffee, MD on 11/13/2023  3:31 PM.     Pt here for IVA OS  Referring physician: Frazier, [place], OD [address] [place],  [place] [address]  HISTORICAL INFORMATION:  Selected notes from the MEDICAL RECORD NUMBER Referred by Dr. Micael Adas for concern of NPDR  LEE:  Ocular Hx-  Previously followed w/ [facility] -- last visit 09.26.2022 history of focal laser OU w/ [facility] -- OD 09.03.2020, 09.06.2022; OS 09.14.22  PMH-   CURRENT MEDICATIONS: No current outpatient medications on file. (Ophthalmic Drugs)   No current facility-administered medications for this visit. (Ophthalmic Drugs)   Current Outpatient Medications (Other)  Medication Sig   b complex vitamins capsule Take 1 capsule by mouth daily.   Blood Glucose Monitoring Suppl (BLOOD GLUCOSE MONITOR SYSTEM) w/Device KIT Use to check blood sugar three times daily   Cholecalciferol (VITAMIN D3) 1.25 MG (50000 UT) CAPS Take 1 capsule by mouth once a week.   docusate sodium  (COLACE) 100 MG capsule Take 1 capsule (100 mg total) by mouth 2 (two) times daily as needed for mild constipation.   EPINEPHrine 0.3 mg/0.3 mL IJ SOAJ injection Inject 0.3 mg into the muscle as needed for anaphylaxis.   ferrous sulfate  325 (65 FE) MG tablet Take 1 tablet (325 mg total) by mouth 2 (two) times daily with a meal.   Glucose Blood  (BLOOD GLUCOSE TEST STRIPS) STRP Use to check blood sugar three times daily   glucose blood test strip Use as instructed (Patient taking differently: 1 each by Other route in the morning, at noon, and at bedtime. Use as instructed)   insulin  glargine (LANTUS ) 100 UNIT/ML Solostar Pen Inject 25 Units into the skin daily.   insulin  lispro (HUMALOG ) 100 UNIT/ML KwikPen Inject 3 Units into the skin 3 (three) times daily with meals. If eating and Blood Glucose (BG) 80 or higher inject 5 units for meal coverage and add correction dose per scale. If not eating, correction dose only. BG <150= 0 unit; BG 150-200= 1 unit; BG 201-250= 3 unit; BG 251-300= 5 unit; BG 301-350= 7 unit; BG 351-400= 9 unit; BG >400= 11 unit and Call Primary Care.   Insulin  Pen Needle (B-D UF III MINI PEN NEEDLES) 31G X 5 MM MISC Use as instructed (Patient taking differently: 1 each by Other route in the morning, at noon, and at bedtime. Use as instructed)   Insulin  Pen Needle (PEN NEEDLES) 31G X 5 MM MISC Use to inject insulin  three times daily.   Lancet Device MISC 1 each by Does not apply route 3 (three) times daily. May dispense any manufacturer covered by patient's insurance.   Lancets MISC Use to check blood sugar 3 times daily   lisinopril  (  ZESTRIL ) 20 MG tablet Take 1 tablet (20 mg total) by mouth daily.   pantoprazole  (PROTONIX ) 40 MG tablet Take 1 tablet (40 mg total) by mouth daily before breakfast.   No current facility-administered medications for this visit. (Other)   REVIEW OF SYSTEMS: ROS   Positive for: Endocrine, Eyes Negative for: Constitutional, Gastrointestinal, Neurological, Skin, Genitourinary, Musculoskeletal, HENT, Cardiovascular, Respiratory, Psychiatric, Allergic/Imm, Heme/Lymph Last edited by Anthony Bateman, COT on 11/13/2023  8:11 AM.      ALLERGIES Allergies  Allergen Reactions   Sulfa Antibiotics Hives   PAST MEDICAL HISTORY Past Medical History:  Diagnosis Date   ADHD (attention deficit  hyperactivity disorder)    AKI (acute kidney injury) (HCC) 10/06/2020   Anemia    Blood transfusion without reported diagnosis    Constipation    Diabetes mellitus type 1, uncontrolled    DKA (diabetic ketoacidosis) (HCC) 10/06/2020   GERD (gastroesophageal reflux disease)    Hyperlipidemia    Incontinence, feces    Past Surgical History:  Procedure Laterality Date   ANAL RECTAL MANOMETRY N/A 07/29/2023   Procedure: ANO RECTAL MANOMETRY;  Surgeon: Sergio Dandy, MD;  Location: [facility];  Service: Gastroenterology;  Laterality: N/A;   I & D EXTREMITY Right 03/09/2021   Procedure: IRRIGATION AND DEBRIDEMENT RIGHT RING FINGER;  Surgeon: Brunilda Capra, MD;  Location: [facility];  Service: Orthopedics;  Laterality: Right;   TUBAL LIGATION     FAMILY HISTORY Family History  Problem Relation Age of Onset   Heart disease Mother    Congestive Heart Failure Mother    Congestive Heart Failure Sister    Diabetes Maternal Grandmother    Colon polyps Neg Hx    Crohn's disease Neg Hx    Esophageal cancer Neg Hx    Rectal cancer Neg Hx    Stomach cancer Neg Hx    SOCIAL HISTORY Social History   Tobacco Use   Smoking status: Some Days    Current packs/day: 0.25    Average packs/day: 0.3 packs/day for 2.0 years (0.5 ttl pk-yrs)    Types: Cigarettes   Smokeless tobacco: Never   Tobacco comments:    also uses Black and Mild  Vaping Use   Vaping status: Never Used  Substance Use Topics   Alcohol use: Never   Drug use: Yes    Comment: CBD edibles       OPHTHALMIC EXAM:  Base Eye Exam     Visual Acuity (Snellen - Linear)       Right Left   Dist [place] 20/20 -2 20/40 -3   Dist ph [place]  20/30 -3         Tonometry (Tonopen, 8:16 AM)       Right Left   Pressure 17 17         Pupils       Pupils Dark Light Shape React APD   Right PERRL 4 3 Round Brisk None   Left PERRL 4 3 Round Brisk None         Visual Fields (Counting fingers)       Left Right    Full Full          Extraocular Movement       Right Left    Full, Ortho Full, Ortho         Neuro/Psych     Oriented x3: Yes   Mood/Affect: Normal         Dilation     Left eye: 1.0%  Mydriacyl, 2.5% Phenylephrine  @ 8:16 AM           Slit Lamp and Fundus Exam     Slit Lamp Exam       Right Left   Lids/Lashes Dermatochalasis - upper lid Dermatochalasis - upper lid   Conjunctiva/Sclera mild melanosis mild melanosis   Cornea Clear Clear   Anterior Chamber deep and clear deep and clear   Iris Round and dilated, No NVI Round and dilated, No NVI   Lens 1-2+ Nuclear sclerosis, 1-2+ Cortical cataract 1-2+ Nuclear sclerosis, 1-2+ Cortical cataract   Anterior Vitreous mild syneresis mild syneresis, blood stained vitreous condensations greatest inferiorly         Fundus Exam       Right Left   Disc Pink and Sharp, fine vascular, ?early NVD Pink and Sharp, +fibrosis, +NVD   C/D Ratio 0.2 0.3   Macula Flat, Good foveal reflex, scattered MA / DBH, exudates and cystic changes, scattered focal laser changes Flat, Blunted foveal reflex, ERM with striae, scatterd MA / DBH, exudates and cystic changes, scattered focal laser changes   Vessels attenuated, mild tortuosity, ?IRMA attenuated, Tortuous, +NV greatest nasal to disc   Periphery Attached, MA / DBH Attached, inferior periphery partially obscured by VH, scattered MA / DBH, fibrotic NV nasal to disc           IMAGING AND PROCEDURES  Imaging and Procedures for 11/13/2023  OCT, Retina - OU - Both Eyes        Right Eye Quality was good. Central Foveal Thickness: 268. Progression has been stable. Findings include normal foveal contour, no SRF, intraretinal hyper-reflective material, intraretinal fluid, outer retinal atrophy, vitreomacular adhesion (Focal cystic changes and IRHM temporal macula ).   Left Eye Quality was good. Central Foveal Thickness: 317. Progression has improved. Findings include no SRF, abnormal foveal contour,  subretinal hyper-reflective material, epiretinal membrane, intraretinal fluid, outer retinal atrophy, preretinal fibrosis (+vitreous opacities -- improved; ERM with pucker, focal cystic changes and IRHM temporal macula).   Notes  *Images captured and stored on drive  Diagnosis / Impression:  Mild DME OU OD: Focal cystic changes and IRHM temporal macula  OS: +vitreous opacities -- improved; ERM with pucker, focal cystic changes and IRHM temporal macula  Clinical management:  See below  Abbreviations: NFP - Normal foveal profile. CME - cystoid macular edema. PED - pigment epithelial detachment. IRF - intraretinal fluid. SRF - subretinal fluid. EZ - ellipsoid zone. ERM - epiretinal membrane. ORA - outer retinal atrophy. ORT - outer retinal tubulation. SRHM - subretinal hyper-reflective material. IRHM - intraretinal hyper-reflective material      Intravitreal Injection, Pharmacologic Agent - OS - Left Eye       Time Out 11/13/2023. 8:44 AM. Confirmed correct patient, procedure, site, and patient consented.   Anesthesia No anesthesia was used. Anesthetic medications included Lidocaine  2%, Proparacaine 0.5%.   Procedure Preparation included 5% betadine to ocular surface, eyelid speculum. A (32g) needle was used.   Injection: 1.25 mg Bevacizumab 1.25mg /0.26ml   Route: Intravitreal, Site: Left Eye   NDC: [id number], Lot: [id number], Expiration date: 03/03/2024   Post-op Post injection exam found visual acuity of at least counting fingers. The patient tolerated the procedure well. There were no complications. The patient received written and verbal post procedure care education. Post injection medications were not given.           ASSESSMENT/PLAN:   ICD-10-CM   1. Proliferative diabetic retinopathy of left eye with macular edema associated  with type 1 diabetes mellitus (HCC)  E10.3512 OCT, Retina - OU - Both Eyes    Intravitreal Injection, Pharmacologic Agent - OS - Left Eye     Bevacizumab (AVASTIN) SOLN 1.25 mg    2. 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 Current use of insulin  (HCC)  Z79.4     4. Vitreous hemorrhage of left eye (HCC)  H43.12     5. Essential hypertension  I10     6. Hypertensive retinopathy of both eyes  H35.033     7. Nuclear sclerosis of both eyes  H25.13      1-4. Proliferative diabetic retinopathy, left eye        Severe NPDR, right eye  - previously managed by [facility], but lost to retina f/u since 09.26.2022  - h/o focal laser OU by [facility] -- OD 09.03.2020, 09.06.2022; OS 09.14.22  - pt referred back here by Dr. Micael Adas  - pt reports 1 yr history of waxing/waning floaters OS, floaters more persistent OS last several months - exam shows scattered MA/DBH OU; +NV w/ fibrosis OS - OCT shows OD: Focal cystic changes and IRHM temporal macula; OS: +vitreous opacities -- improved; ERM with pucker, focal cystic changes and IRHM temporal macula - BCVA OD 20/20; OS 20/30 - recommend IVA OS #1 today, 05.16.25 for PDR and VH - pt wishes to proceed but injection  - RBA of procedure discussed, questions answered - IVA informed consent obtained and signed, 05.16.25 - see procedure note - f/u in 4-5 wks -- DFE/OCT, FA, possible injection  5,6. Hypertensive retinopathy OU  - BP today (05.05.25) 166/108 - discussed importance of tight BP control - monitor  7. Nuclear sclerosis OU - The symptoms of cataract, surgical options, and treatments and risks were discussed with patient. - discussed diagnosis and progression - monitor    Ophthalmic Meds Ordered this visit:  Meds ordered this encounter  Medications   Bevacizumab (AVASTIN) SOLN 1.25 mg     Return for f/u 4-5 weeks, PDR OS, DFE, OCT, FA, Possible Injxn.  There are no Patient Instructions on file for this visit.  This document serves as a record of services personally performed by Jeanice Millard, MD, PhD. It was created on their behalf by Eller Gut COT, an ophthalmic technician. The creation of this record is the provider's dictation and/or activities during the visit.    Electronically signed by: Eller Gut COT 05.15.2025 3:31 PM  Jeanice Millard, M.D., Ph.D. Diseases & Surgery of the Retina and Vitreous [facility] 11/13/2023   I have reviewed the above documentation for accuracy and completeness, and I agree with the above. Jeanice Millard, M.D., Ph.D. 11/13/23 3:33 PM   Abbreviations: M myopia (nearsighted); A astigmatism; H hyperopia (farsighted); P presbyopia; Mrx spectacle prescription;  CTL contact lenses; OD right eye; OS left eye; OU both eyes  XT exotropia; ET esotropia; PEK punctate epithelial keratitis; PEE punctate epithelial erosions; DES dry eye syndrome; MGD meibomian gland dysfunction; ATs artificial tears; PFAT's preservative free artificial tears; NSC nuclear sclerotic cataract; PSC posterior subcapsular cataract; ERM epi-retinal membrane; PVD posterior vitreous detachment; RD retinal detachment; DM diabetes mellitus; DR diabetic retinopathy; NPDR non-proliferative diabetic retinopathy; PDR proliferative diabetic retinopathy; CSME clinically significant macular edema; DME diabetic macular edema; dbh dot blot hemorrhages; CWS cotton wool spot; POAG primary open angle glaucoma; C/D cup-to-disc ratio; HVF humphrey  visual field; GVF goldmann visual field; OCT optical coherence tomography; IOP intraocular pressure; BRVO Branch retinal vein occlusion; CRVO central retinal vein occlusion; CRAO central retinal artery occlusion; BRAO branch retinal artery occlusion; RT retinal tear; SB scleral buckle; PPV pars plana vitrectomy; VH Vitreous hemorrhage; PRP panretinal laser photocoagulation; IVK intravitreal kenalog; VMT vitreomacular traction; MH Macular hole;  NVD neovascularization of the disc; NVE neovascularization elsewhere;  AREDS age related eye disease study; ARMD age related macular degeneration; POAG primary open angle glaucoma; EBMD epithelial/anterior basement membrane dystrophy; ACIOL anterior chamber intraocular lens; IOL intraocular lens; PCIOL posterior chamber intraocular lens; Phaco/IOL phacoemulsification with intraocular lens placement; PRK photorefractive keratectomy; LASIK laser assisted in situ keratomileusis; HTN hypertension; DM diabetes mellitus; COPD chronic obstructive pulmonary disease

## 2023-11-13 ENCOUNTER — Ambulatory Visit (INDEPENDENT_AMBULATORY_CARE_PROVIDER_SITE_OTHER): Admitting: Ophthalmology

## 2023-11-13 ENCOUNTER — Encounter (INDEPENDENT_AMBULATORY_CARE_PROVIDER_SITE_OTHER): Payer: Self-pay | Admitting: Ophthalmology

## 2023-11-13 DIAGNOSIS — H35033 Hypertensive retinopathy, bilateral: Secondary | ICD-10-CM

## 2023-11-13 DIAGNOSIS — Z794 Long term (current) use of insulin: Secondary | ICD-10-CM

## 2023-11-13 DIAGNOSIS — E103512 Type 1 diabetes mellitus with proliferative diabetic retinopathy with macular edema, left eye: Secondary | ICD-10-CM

## 2023-11-13 DIAGNOSIS — H2513 Age-related nuclear cataract, bilateral: Secondary | ICD-10-CM

## 2023-11-13 DIAGNOSIS — H4312 Vitreous hemorrhage, left eye: Secondary | ICD-10-CM

## 2023-11-13 DIAGNOSIS — E103411 Type 1 diabetes mellitus with severe nonproliferative diabetic retinopathy with macular edema, right eye: Secondary | ICD-10-CM

## 2023-11-13 DIAGNOSIS — I10 Essential (primary) hypertension: Secondary | ICD-10-CM

## 2023-11-13 MED ORDER — BEVACIZUMAB CHEMO INJECTION 1.25MG/0.05ML SYRINGE FOR KALEIDOSCOPE
1.2500 mg | INTRAVITREAL | Status: AC | PRN
  Administered 2023-11-13: 1.25 mg via INTRAVITREAL

## 2023-11-16 ENCOUNTER — Other Ambulatory Visit (HOSPITAL_COMMUNITY): Payer: Self-pay

## 2023-11-17 ENCOUNTER — Other Ambulatory Visit (HOSPITAL_COMMUNITY): Payer: Self-pay

## 2023-11-18 ENCOUNTER — Other Ambulatory Visit (HOSPITAL_BASED_OUTPATIENT_CLINIC_OR_DEPARTMENT_OTHER): Payer: Self-pay

## 2023-12-04 NOTE — Progress Notes (Shared)
 [facility] - Clinic Note  12/17/2023   CHIEF COMPLAINT Patient presents for No chief complaint on file.  HISTORY OF PRESENT ILLNESS: Connie Vaughn is a 48 y.o. female who presents to the clinic today for:    Pt here for IVA OS  Referring physician: Frazier, [place], OD [address],  [place] [address]  HISTORICAL INFORMATION:  Selected notes from the MEDICAL RECORD NUMBER Referred by Dr. Micael Adas for concern of NPDR  LEE:  Ocular Hx-  Previously followed w/ [facility] -- last visit 09.26.2022 history of focal laser OU w/ [facility] -- OD 09.03.2020, 09.06.2022; OS 09.14.22  PMH-   CURRENT MEDICATIONS: No current outpatient medications on file. (Ophthalmic Drugs)   No current facility-administered medications for this visit. (Ophthalmic Drugs)   Current Outpatient Medications (Other)  Medication Sig   b complex vitamins capsule Take 1 capsule by mouth daily.   Blood Glucose Monitoring Suppl (BLOOD GLUCOSE MONITOR SYSTEM) w/Device KIT Use to check blood sugar three times daily   Cholecalciferol (VITAMIN D3) 1.25 MG (50000 UT) CAPS Take 1 capsule by mouth once a week.   docusate sodium  (COLACE) 100 MG capsule Take 1 capsule (100 mg total) by mouth 2 (two) times daily as needed for mild constipation.   EPINEPHrine 0.3 mg/0.3 mL IJ SOAJ injection Inject 0.3 mg into the muscle as needed for anaphylaxis.   ferrous sulfate  325 (65 FE) MG tablet Take 1 tablet (325 mg total) by mouth 2 (two) times daily with a meal.   Glucose Blood (BLOOD GLUCOSE TEST STRIPS) STRP Use to check blood sugar three times daily   glucose blood test strip Use as instructed (Patient taking differently: 1 each by Other route in the morning, at noon, and at bedtime. Use as instructed)   insulin  glargine (LANTUS ) 100 UNIT/ML Solostar Pen Inject 25 Units into the skin daily.   insulin  lispro (HUMALOG ) 100 UNIT/ML KwikPen Inject 3 Units into the skin 3 (three) times daily with meals. If  eating and Blood Glucose (BG) 80 or higher inject 5 units for meal coverage and add correction dose per scale. If not eating, correction dose only. BG <150= 0 unit; BG 150-200= 1 unit; BG 201-250= 3 unit; BG 251-300= 5 unit; BG 301-350= 7 unit; BG 351-400= 9 unit; BG >400= 11 unit and Call Primary Care.   Insulin  Pen Needle (B-D UF III MINI PEN NEEDLES) 31G X 5 MM MISC Use as instructed (Patient taking differently: 1 each by Other route in the morning, at noon, and at bedtime. Use as instructed)   Insulin  Pen Needle (PEN NEEDLES) 31G X 5 MM MISC Use to inject insulin  three times daily.   Lancet Device MISC 1 each by Does not apply route 3 (three) times daily. May dispense any manufacturer covered by patient's insurance.   Lancets MISC Use to check blood sugar 3 times daily   lisinopril  (ZESTRIL ) 20 MG tablet Take 1 tablet (20 mg total) by mouth daily.   pantoprazole  (PROTONIX ) 40 MG tablet Take 1 tablet (40 mg total) by mouth daily before breakfast.   No current facility-administered medications for this visit. (Other)   REVIEW OF SYSTEMS:    ALLERGIES Allergies  Allergen Reactions   Sulfa Antibiotics Hives   PAST MEDICAL HISTORY Past Medical History:  Diagnosis Date   ADHD (attention deficit hyperactivity disorder)    AKI (acute kidney injury) (HCC) 10/06/2020   Anemia    Blood transfusion without reported diagnosis  Constipation    Diabetes mellitus type 1, uncontrolled    DKA (diabetic ketoacidosis) (HCC) 10/06/2020   GERD (gastroesophageal reflux disease)    Hyperlipidemia    Incontinence, feces    Past Surgical History:  Procedure Laterality Date   ANAL RECTAL MANOMETRY N/A 07/29/2023   Procedure: ANO RECTAL MANOMETRY;  Surgeon: Sergio Dandy, MD;  Location: [facility];  Service: Gastroenterology;  Laterality: N/A;   I & D EXTREMITY Right 03/09/2021   Procedure: IRRIGATION AND DEBRIDEMENT RIGHT RING FINGER;  Surgeon: Brunilda Capra, MD;  Location: [facility];  Service:  Orthopedics;  Laterality: Right;   TUBAL LIGATION     FAMILY HISTORY Family History  Problem Relation Age of Onset   Heart disease Mother    Congestive Heart Failure Mother    Congestive Heart Failure Sister    Diabetes Maternal Grandmother    Colon polyps Neg Hx    Crohn's disease Neg Hx    Esophageal cancer Neg Hx    Rectal cancer Neg Hx    Stomach cancer Neg Hx    SOCIAL HISTORY Social History   Tobacco Use   Smoking status: Some Days    Current packs/day: 0.25    Average packs/day: 0.3 packs/day for 2.0 years (0.5 ttl pk-yrs)    Types: Cigarettes   Smokeless tobacco: Never   Tobacco comments:    also uses Black and Mild  Vaping Use   Vaping status: Never Used  Substance Use Topics   Alcohol use: Never   Drug use: Yes    Comment: CBD edibles       OPHTHALMIC EXAM:  Not recorded    IMAGING AND PROCEDURES  Imaging and Procedures for 12/17/2023         ASSESSMENT/PLAN:   ICD-10-CM   1. Proliferative diabetic retinopathy of left eye with macular edema associated with type 1 diabetes mellitus (HCC)  Z61.0960     2. 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 Current use of insulin  (HCC)  Z79.4     4. Vitreous hemorrhage of left eye (HCC)  H43.12     5. Essential hypertension  I10     6. Hypertensive retinopathy of both eyes  H35.033     7. Nuclear sclerosis of both eyes  H25.13      1-4. Proliferative diabetic retinopathy, left eye        Severe NPDR, right eye  - s/p IVA OS #1 (05.16.25)  - previously managed by [facility], but lost to retina f/u since 09.26.2022  - h/o focal laser OU by [facility] -- OD 09.03.2020, 09.06.2022; OS 09.14.22  - pt referred back here by Dr. Micael Adas  - pt reports 1 yr history of waxing/waning floaters OS, floaters more persistent OS last several months - exam shows scattered MA/DBH OU; +NV w/ fibrosis OS - OCT shows OD: Focal cystic  changes and IRHM temporal macula; OS: +vitreous opacities -- improved; ERM with pucker, focal cystic changes and IRHM temporal macula - BCVA OD 20/20; OS 20/30 - recommend IVA OS #2 today, 06.19.25 for PDR and VH - pt wishes to proceed but injection  - RBA of procedure discussed, questions answered - IVA informed consent obtained and signed, 05.16.25 - see procedure note - f/u in 4-5 wks -- DFE/OCT, FA, possible injection  5,6. Hypertensive retinopathy OU  - BP today (05.05.25) 166/108 - discussed importance of tight BP control -  monitor  7. Nuclear sclerosis OU - The symptoms of cataract, surgical options, and treatments and risks were discussed with patient. - discussed diagnosis and progression - monitor    Ophthalmic Meds Ordered this visit:  No orders of the defined types were placed in this encounter.    No follow-ups on file.  There are no Patient Instructions on file for this visit.  This document serves as a record of services personally performed by Jeanice Millard, MD, PhD. It was created on their behalf by Morley Arabia. Bevin Bucks, OA an ophthalmic technician. The creation of this record is the provider's dictation and/or activities during the visit.    Electronically signed by: Morley Arabia. Bevin Bucks, OA 12/04/23 12:12 PM   Jeanice Millard, M.D., Ph.D. Diseases & Surgery of the Retina and Vitreous [facility] 12/17/2023      Abbreviations: M myopia (nearsighted); A astigmatism; H hyperopia (farsighted); P presbyopia; Mrx spectacle prescription;  CTL contact lenses; OD right eye; OS left eye; OU both eyes  XT exotropia; ET esotropia; PEK punctate epithelial keratitis; PEE punctate epithelial erosions; DES dry eye syndrome; MGD meibomian gland dysfunction; ATs artificial tears; PFAT's preservative free artificial tears; NSC nuclear sclerotic cataract; PSC posterior subcapsular cataract; ERM epi-retinal membrane; PVD posterior vitreous detachment; RD retinal  detachment; DM diabetes mellitus; DR diabetic retinopathy; NPDR non-proliferative diabetic retinopathy; PDR proliferative diabetic retinopathy; CSME clinically significant macular edema; DME diabetic macular edema; dbh dot blot hemorrhages; CWS cotton wool spot; POAG primary open angle glaucoma; C/D cup-to-disc ratio; HVF humphrey visual field; GVF goldmann visual field; OCT optical coherence tomography; IOP intraocular pressure; BRVO Branch retinal vein occlusion; CRVO central retinal vein occlusion; CRAO central retinal artery occlusion; BRAO branch retinal artery occlusion; RT retinal tear; SB scleral buckle; PPV pars plana vitrectomy; VH Vitreous hemorrhage; PRP panretinal laser photocoagulation; IVK intravitreal kenalog; VMT vitreomacular traction; MH Macular hole;  NVD neovascularization of the disc; NVE neovascularization elsewhere; AREDS age related eye disease study; ARMD age related macular degeneration; POAG primary open angle glaucoma; EBMD epithelial/anterior basement membrane dystrophy; ACIOL anterior chamber intraocular lens; IOL intraocular lens; PCIOL posterior chamber intraocular lens; Phaco/IOL phacoemulsification with intraocular lens placement; PRK photorefractive keratectomy; LASIK laser assisted in situ keratomileusis; HTN hypertension; DM diabetes mellitus; COPD chronic obstructive pulmonary disease

## 2023-12-14 ENCOUNTER — Encounter (INDEPENDENT_AMBULATORY_CARE_PROVIDER_SITE_OTHER): Admitting: Ophthalmology

## 2023-12-15 ENCOUNTER — Ambulatory Visit: Admitting: Dietician

## 2023-12-16 ENCOUNTER — Inpatient Hospital Stay (HOSPITAL_BASED_OUTPATIENT_CLINIC_OR_DEPARTMENT_OTHER)
Admission: EM | Admit: 2023-12-16 | Discharge: 2023-12-17 | DRG: 872 | Disposition: A | Discharge status: Home / Self Care | Attending: Internal Medicine | Admitting: Internal Medicine

## 2023-12-16 ENCOUNTER — Encounter (HOSPITAL_BASED_OUTPATIENT_CLINIC_OR_DEPARTMENT_OTHER): Payer: Self-pay | Admitting: Emergency Medicine

## 2023-12-16 ENCOUNTER — Other Ambulatory Visit: Payer: Self-pay

## 2023-12-16 ENCOUNTER — Emergency Department (HOSPITAL_BASED_OUTPATIENT_CLINIC_OR_DEPARTMENT_OTHER)

## 2023-12-16 DIAGNOSIS — E871 Hypo-osmolality and hyponatremia: Secondary | ICD-10-CM | POA: Diagnosis present

## 2023-12-16 DIAGNOSIS — E861 Hypovolemia: Secondary | ICD-10-CM | POA: Diagnosis present

## 2023-12-16 DIAGNOSIS — E10621 Type 1 diabetes mellitus with foot ulcer: Secondary | ICD-10-CM | POA: Diagnosis present

## 2023-12-16 DIAGNOSIS — M869 Osteomyelitis, unspecified: Secondary | ICD-10-CM | POA: Diagnosis not present

## 2023-12-16 DIAGNOSIS — E1065 Type 1 diabetes mellitus with hyperglycemia: Secondary | ICD-10-CM | POA: Diagnosis present

## 2023-12-16 DIAGNOSIS — L97511 Non-pressure chronic ulcer of other part of right foot limited to breakdown of skin: Secondary | ICD-10-CM

## 2023-12-16 DIAGNOSIS — E785 Hyperlipidemia, unspecified: Secondary | ICD-10-CM | POA: Diagnosis present

## 2023-12-16 DIAGNOSIS — L03317 Cellulitis of buttock: Secondary | ICD-10-CM | POA: Insufficient documentation

## 2023-12-16 DIAGNOSIS — N179 Acute kidney failure, unspecified: Secondary | ICD-10-CM | POA: Diagnosis present

## 2023-12-16 DIAGNOSIS — I10 Essential (primary) hypertension: Secondary | ICD-10-CM | POA: Diagnosis present

## 2023-12-16 DIAGNOSIS — F17210 Nicotine dependence, cigarettes, uncomplicated: Secondary | ICD-10-CM | POA: Diagnosis present

## 2023-12-16 DIAGNOSIS — Z8249 Family history of ischemic heart disease and other diseases of the circulatory system: Secondary | ICD-10-CM | POA: Diagnosis not present

## 2023-12-16 DIAGNOSIS — Z882 Allergy status to sulfonamides status: Secondary | ICD-10-CM | POA: Diagnosis not present

## 2023-12-16 DIAGNOSIS — D508 Other iron deficiency anemias: Secondary | ICD-10-CM | POA: Diagnosis present

## 2023-12-16 DIAGNOSIS — Z833 Family history of diabetes mellitus: Secondary | ICD-10-CM | POA: Diagnosis not present

## 2023-12-16 DIAGNOSIS — K219 Gastro-esophageal reflux disease without esophagitis: Secondary | ICD-10-CM | POA: Diagnosis present

## 2023-12-16 DIAGNOSIS — I7090 Unspecified atherosclerosis: Secondary | ICD-10-CM | POA: Diagnosis not present

## 2023-12-16 DIAGNOSIS — L03115 Cellulitis of right lower limb: Secondary | ICD-10-CM | POA: Diagnosis present

## 2023-12-16 DIAGNOSIS — A419 Sepsis, unspecified organism: Principal | ICD-10-CM | POA: Diagnosis present

## 2023-12-16 DIAGNOSIS — L0231 Cutaneous abscess of buttock: Secondary | ICD-10-CM | POA: Diagnosis present

## 2023-12-16 DIAGNOSIS — E10628 Type 1 diabetes mellitus with other skin complications: Secondary | ICD-10-CM | POA: Diagnosis present

## 2023-12-16 DIAGNOSIS — R6520 Severe sepsis without septic shock: Secondary | ICD-10-CM | POA: Diagnosis present

## 2023-12-16 DIAGNOSIS — Z79899 Other long term (current) drug therapy: Secondary | ICD-10-CM

## 2023-12-16 DIAGNOSIS — D649 Anemia, unspecified: Secondary | ICD-10-CM | POA: Diagnosis present

## 2023-12-16 LAB — COMPREHENSIVE METABOLIC PANEL WITH GFR
ALT: 11 U/L (ref 0–44)
ALT: 8 U/L (ref 0–44)
AST: 17 U/L (ref 15–41)
AST: 13 U/L — ABNORMAL LOW (ref 15–41)
Albumin: 3.8 g/dL (ref 3.5–5.0)
Albumin: 3.3 g/dL — ABNORMAL LOW (ref 3.5–5.0)
Alkaline Phosphatase: 118 U/L (ref 38–126)
Alkaline Phosphatase: 99 U/L (ref 38–126)
Anion gap: 15 (ref 5–15)
Anion gap: 11 (ref 5–15)
BUN: 38 mg/dL — ABNORMAL HIGH (ref 6–20)
BUN: 31 mg/dL — ABNORMAL HIGH (ref 6–20)
CO2: 23 mmol/L (ref 22–32)
CO2: 25 mmol/L (ref 22–32)
Calcium: 9.2 mg/dL (ref 8.9–10.3)
Calcium: 8.5 mg/dL — ABNORMAL LOW (ref 8.9–10.3)
Chloride: 93 mmol/L — ABNORMAL LOW (ref 98–111)
Chloride: 104 mmol/L (ref 98–111)
Creatinine, Ser: 1.21 mg/dL — ABNORMAL HIGH (ref 0.44–1.00)
Creatinine, Ser: 0.83 mg/dL (ref 0.44–1.00)
GFR, Estimated: 55 mL/min — ABNORMAL LOW (ref >60)
GFR, Estimated: >60 mL/min (ref >60)
Glucose, Bld: 622 mg/dL (ref 70–99)
Glucose, Bld: 111 mg/dL — ABNORMAL HIGH (ref 70–99)
Potassium: 5.1 mmol/L (ref 3.5–5.1)
Potassium: 4.2 mmol/L (ref 3.5–5.1)
Sodium: 131 mmol/L — ABNORMAL LOW (ref 135–145)
Sodium: 139 mmol/L (ref 135–145)
Total Bilirubin: <0.2 mg/dL (ref 0.0–1.2)
Total Bilirubin: <0.2 mg/dL (ref 0.0–1.2)
Total Protein: 7.3 g/dL (ref 6.5–8.1)
Total Protein: 6.1 g/dL — ABNORMAL LOW (ref 6.5–8.1)

## 2023-12-16 LAB — I-STAT VENOUS BLOOD GAS, ED
Acid-base deficit: 1 mmol/L (ref 0.0–2.0)
Bicarbonate: 24 mmol/L (ref 20.0–28.0)
Calcium, Ion: 1.19 mmol/L (ref 1.15–1.40)
HCT: 32 % — ABNORMAL LOW (ref 36.0–46.0)
Hemoglobin: 10.9 g/dL — ABNORMAL LOW (ref 12.0–15.0)
O2 Saturation: 66 %
Patient temperature: 98.1
Potassium: 3.9 mmol/L (ref 3.5–5.1)
Sodium: 136 mmol/L (ref 135–145)
TCO2: 25 mmol/L (ref 22–32)
pCO2, Ven: 41.6 mmHg — ABNORMAL LOW (ref 44–60)
pH, Ven: 7.368 (ref 7.25–7.43)
pO2, Ven: 35 mmHg (ref 32–45)

## 2023-12-16 LAB — URINALYSIS, W/ REFLEX TO CULTURE (INFECTION SUSPECTED)
Bacteria, UA: NONE SEEN
Bilirubin Urine: NEGATIVE
Glucose, UA: >1000 mg/dL — AB
Ketones, ur: NEGATIVE mg/dL
Leukocytes,Ua: NEGATIVE
Nitrite: NEGATIVE
Specific Gravity, Urine: 1.014 (ref 1.005–1.030)
pH: 6 (ref 5.0–8.0)

## 2023-12-16 LAB — CBC WITH DIFFERENTIAL/PLATELET
Abs Immature Granulocytes: 0.04 10*3/uL (ref 0.00–0.07)
Basophils Absolute: 0.1 10*3/uL (ref 0.0–0.1)
Basophils Relative: 1 %
Eosinophils Absolute: 0.1 10*3/uL (ref 0.0–0.5)
Eosinophils Relative: 1 %
HCT: 36.2 % (ref 36.0–46.0)
Hemoglobin: 11.8 g/dL — ABNORMAL LOW (ref 12.0–15.0)
Immature Granulocytes: 0 %
Lymphocytes Relative: 14 %
Lymphs Abs: 1.5 10*3/uL (ref 0.7–4.0)
MCH: 29 pg (ref 26.0–34.0)
MCHC: 32.6 g/dL (ref 30.0–36.0)
MCV: 88.9 fL (ref 80.0–100.0)
Monocytes Absolute: 0.5 10*3/uL (ref 0.1–1.0)
Monocytes Relative: 5 %
Neutro Abs: 8.8 10*3/uL — ABNORMAL HIGH (ref 1.7–7.7)
Neutrophils Relative %: 79 %
Platelets: 376 10*3/uL (ref 150–400)
RBC: 4.07 MIL/uL (ref 3.87–5.11)
RDW: 14.3 % (ref 11.5–15.5)
WBC: 11 10*3/uL — ABNORMAL HIGH (ref 4.0–10.5)
nRBC: 0 % (ref 0.0–0.2)

## 2023-12-16 LAB — BETA-HYDROXYBUTYRIC ACID: Beta-Hydroxybutyric Acid: 0.1 mmol/L (ref 0.05–0.27)

## 2023-12-16 LAB — CBG MONITORING, ED
Glucose-Capillary: 210 mg/dL — ABNORMAL HIGH (ref 70–99)
Glucose-Capillary: 128 mg/dL — ABNORMAL HIGH (ref 70–99)

## 2023-12-16 LAB — LACTIC ACID, PLASMA
Lactic Acid, Venous: 2.6 mmol/L (ref 0.5–1.9)
Lactic Acid, Venous: 1.1 mmol/L (ref 0.5–1.9)

## 2023-12-16 LAB — PREGNANCY, URINE: Preg Test, Ur: NEGATIVE

## 2023-12-16 MED ORDER — SODIUM CHLORIDE 0.9 % IV BOLUS
2000.0000 mL | Freq: Once | INTRAVENOUS | Status: AC
  Administered 2023-12-16: 2000 mL via INTRAVENOUS

## 2023-12-16 MED ORDER — LINEZOLID 600 MG/300ML IV SOLN
600.0000 mg | Freq: Once | INTRAVENOUS | Status: AC
  Administered 2023-12-16: 600 mg via INTRAVENOUS
  Filled 2023-12-16: qty 300

## 2023-12-16 MED ORDER — SODIUM CHLORIDE 0.9 % IV SOLN
3.0000 g | Freq: Once | INTRAVENOUS | Status: AC
  Administered 2023-12-16: 3 g via INTRAVENOUS

## 2023-12-16 MED ORDER — LINEZOLID 600 MG PO TABS: 600.0000 mg | ORAL_TABLET | Freq: Once | ORAL | Status: DC

## 2023-12-16 NOTE — H&P (Incomplete)
 History and Physical    Connie Vaughn:[id number] DOB: Dec 31, 1975 DOA: 12/16/2023  Patient coming from: Home.  Chief Complaint: Right foot swelling.  Patient examined in presence of chaperone.  HPI: Connie Vaughn is a 48 y.o. female with history of diabetes mellitus type 1, hypertension, tobacco abuse presents to the ER with complaints of right foot swelling.  Patient states about 3 days ago when she was cleaning her foot she noticed a small ulcer on the plantar aspect of the right great toe.  Over the last 24 hours she noticed her right foot has been getting swollen warm and tender with some pain.  At this point patient decided to come to the ER.  Denies any fever chills or any trauma.  About a week ago patient had hair removal following which patient developed cellulitis around the left buttock area which started draining.  Patient was given antibiotics by primary care physician and patient was taking doxycycline .  ED Course: In the ER patient's initial labs were showing blood glucose of 622 with bicarb of 23 anion gap of 15 lactic acid was 2.6.  Patient self administered 17 units of insulin  and also ER physician had given fluid bolus following which blood sugar and creatinine improved.  X-ray of the right foot shows possible osteomyelitis involving the first metatarsal bone.  On exam patient also has swelling and warmth of the right foot extending up to the distal right leg.  Patient has chronic changes on the left foot great toe.  Dr. Julio Ohm orthopedic surgeon was consulted patient had blood cultures drawn and started on empiric antibiotics.  Review of Systems: As per HPI, rest all negative.   Past Medical History:  Diagnosis Date   ADHD (attention deficit hyperactivity disorder)    AKI (acute kidney injury) (HCC) 10/06/2020   Anemia    Blood transfusion without reported diagnosis    Constipation    Diabetes mellitus type 1, uncontrolled    DKA (diabetic ketoacidosis) (HCC)  10/06/2020   GERD (gastroesophageal reflux disease)    Hyperlipidemia    Incontinence, feces     Past Surgical History:  Procedure Laterality Date   ANAL RECTAL MANOMETRY N/A 07/29/2023   Procedure: ANO RECTAL MANOMETRY;  Surgeon: Nandigam, Kavitha V, MD;  Location: [facility];  Service: Gastroenterology;  Laterality: N/A;   I & D EXTREMITY Right 03/09/2021   Procedure: IRRIGATION AND DEBRIDEMENT RIGHT RING FINGER;  Surgeon: Brunilda Capra, MD;  Location: [facility] OR;  Service: Orthopedics;  Laterality: Right;   TUBAL LIGATION       reports that she has been smoking cigarettes. She has a 0.5 pack-year smoking history. She has never used smokeless tobacco. She reports current drug use. She reports that she does not drink alcohol.  Allergies  Allergen Reactions   Sulfa Antibiotics Hives    Family History  Problem Relation Age of Onset   Heart disease Mother    Congestive Heart Failure Mother    Congestive Heart Failure Sister    Diabetes Maternal Grandmother    Colon polyps Neg Hx    Crohn's disease Neg Hx    Esophageal cancer Neg Hx    Rectal cancer Neg Hx    Stomach cancer Neg Hx     Prior to Admission medications   Medication Sig Start Date End Date Taking? Authorizing Provider  b complex vitamins capsule Take 1 capsule by mouth daily.    [provider]  Blood Glucose Monitoring Suppl (BLOOD GLUCOSE MONITOR SYSTEM) w/Device  KIT Use to check blood sugar three times daily 10/15/23   Amin, Ankit C, MD  Cholecalciferol (VITAMIN D3) 1.25 MG (50000 UT) CAPS Take 1 capsule by mouth once a week. 07/13/23   [provider]  docusate sodium  (COLACE) 100 MG capsule Take 1 capsule (100 mg total) by mouth 2 (two) times daily as needed for mild constipation. 10/15/23   Amin, Ankit C, MD  EPINEPHrine 0.3 mg/0.3 mL IJ SOAJ injection Inject 0.3 mg into the muscle as needed for anaphylaxis. 07/12/23   [provider]  ferrous sulfate  325 (65 FE) MG tablet Take 1 tablet (325  mg total) by mouth 2 (two) times daily with a meal. 10/15/23   Amin, Ankit C, MD  Glucose Blood (BLOOD GLUCOSE TEST STRIPS) STRP Use to check blood sugar three times daily 10/15/23   Amin, Ankit C, MD  glucose blood test strip Use as instructed Patient taking differently: 1 each by Other route in the morning, at noon, and at bedtime. Use as instructed 11/18/17   Fleming, Zelda W, NP  insulin  glargine (LANTUS ) 100 UNIT/ML Solostar Pen Inject 25 Units into the skin daily. 10/15/23   Amin, Ankit C, MD  insulin  lispro (HUMALOG ) 100 UNIT/ML KwikPen Inject 3 Units into the skin 3 (three) times daily with meals. If eating and Blood Glucose (BG) 80 or higher inject 5 units for meal coverage and add correction dose per scale. If not eating, correction dose only. BG <150= 0 unit; BG 150-200= 1 unit; BG 201-250= 3 unit; BG 251-300= 5 unit; BG 301-350= 7 unit; BG 351-400= 9 unit; BG >400= 11 unit and Call Primary Care. 10/15/23   Amin, Ankit C, MD  Insulin  Pen Needle (B-D UF III MINI PEN NEEDLES) 31G X 5 MM MISC Use as instructed Patient taking differently: 1 each by Other route in the morning, at noon, and at bedtime. Use as instructed 11/18/17   Fleming, Zelda W, NP  Insulin  Pen Needle (PEN NEEDLES) 31G X 5 MM MISC Use to inject insulin  three times daily. 10/15/23   Maggie Schooner, MD  Lancet Device MISC 1 each by Does not apply route 3 (three) times daily. May dispense any manufacturer covered by patient's insurance. 10/15/23   Maggie Schooner, MD  Lancets MISC Use to check blood sugar 3 times daily 10/15/23   Amin, Ankit C, MD  lisinopril  (ZESTRIL ) 20 MG tablet Take 1 tablet (20 mg total) by mouth daily. 10/15/23   Amin, Ankit C, MD  pantoprazole  (PROTONIX ) 40 MG tablet Take 1 tablet (40 mg total) by mouth daily before breakfast. 10/15/23   Maggie Schooner, MD    Physical Exam: Constitutional: Moderately built and nourished. Vitals:   12/16/23 1815 12/16/23 2045 12/16/23 2130 12/16/23 2327  BP: 120/70 (!) 151/76 (!)  155/91 (!) 166/94  Pulse: 91 81 80 77  Resp: 18  15 15   Temp:   98 F (36.7 C) 97.6 F (36.4 C)  TempSrc:      SpO2: 98% 98% 98% 100%  Weight:      Height:       Eyes: Anicteric no pallor. ENMT: No discharge from the ears eyes nose or mouth. Neck: No mass felt no neck rigidity. Respiratory: No rhonchi or crepitations. Cardiovascular: S1-S2 heard. Abdomen: Soft nontender bowel sound present.  Mild induration of the left buttock crease. Musculoskeletal: Swelling of the right foot extending up to the distal right leg. Skin: Erythema and swelling of the right foot and distal right  leg.  There is an ulcer on the plantar aspect of the right foot great toe measuring about 1 cm.  There is also some change in the left foot great toe.  Mild induration of the left buttock crease. Neurologic: Alert awake oriented to time place and person.  Moves all extremities. Psychiatric: Appears normal.  Normal affect.   Labs on Admission: I have personally reviewed following labs and imaging studies  CBC: Recent Labs  Lab 12/16/23 1543 12/16/23 1737  WBC 11.0*  --   NEUTROABS 8.8*  --   HGB 11.8* 10.9*  HCT 36.2 32.0*  MCV 88.9  --   PLT 376  --    Basic Metabolic Panel: Recent Labs  Lab 12/16/23 1543 12/16/23 1737 12/16/23 2004  NA 131* 136 139  K 5.1 3.9 4.2  CL 93*  --  104  CO2 23  --  25  GLUCOSE 622*  --  111*  BUN 38*  --  31*  CREATININE 1.21*  --  0.83  CALCIUM 9.2  --  8.5*   GFR: Estimated Creatinine Clearance: 72.4 mL/min (by C-G formula based on SCr of 0.83 mg/dL). Liver Function Tests: Recent Labs  Lab 12/16/23 1543 12/16/23 2004  AST 17 13*  ALT 11 8  ALKPHOS 118 99  BILITOT <0.2 <0.2  PROT 7.3 6.1*  ALBUMIN 3.8 3.3*   No results for input(s): LIPASE, AMYLASE in the last 168 hours. No results for input(s): AMMONIA in the last 168 hours. Coagulation Profile: No results for input(s): INR, PROTIME in the last 168 hours. Cardiac Enzymes: No results  for input(s): CKTOTAL, CKMB, CKMBINDEX, TROPONINI in the last 168 hours. BNP (last 3 results) No results for input(s): PROBNP in the last 8760 hours. HbA1C: No results for input(s): HGBA1C in the last 72 hours. CBG: Recent Labs  Lab 12/16/23 1814 12/16/23 1926  GLUCAP 210* 128*   Lipid Profile: No results for input(s): CHOL, HDL, LDLCALC, TRIG, CHOLHDL, LDLDIRECT in the last 72 hours. Thyroid Function Tests: No results for input(s): TSH, T4TOTAL, FREET4, T3FREE, THYROIDAB in the last 72 hours. Anemia Panel: No results for input(s): VITAMINB12, FOLATE, FERRITIN, TIBC, IRON, RETICCTPCT in the last 72 hours. Urine analysis:    Component Value Date/Time   COLORURINE COLORLESS (A) 12/16/2023 2023   APPEARANCEUR CLEAR 12/16/2023 2023   LABSPEC 1.014 12/16/2023 2023   PHURINE 6.0 12/16/2023 2023   GLUCOSEU >1,000 (A) 12/16/2023 2023   HGBUR TRACE (A) 12/16/2023 2023   BILIRUBINUR NEGATIVE 12/16/2023 2023   KETONESUR NEGATIVE 12/16/2023 2023   PROTEINUR TRACE (A) 12/16/2023 2023   NITRITE NEGATIVE 12/16/2023 2023   LEUKOCYTESUR NEGATIVE 12/16/2023 2023   Sepsis Labs: @LABRCNTIP (procalcitonin:4,lacticidven:4) )No results found for this or any previous visit (from the past 240 hours).   Radiological Exams on Admission: DG Foot Complete Right Result Date: 12/16/2023 CLINICAL DATA:  Possible osteomyelitis.  Right foot pain with CT. EXAM: RIGHT FOOT COMPLETE - 3+ VIEW COMPARISON:  None Available. FINDINGS: There is no evidence of acute fracture or dislocation. There questionable mild erosions along the medial aspect of the head of the first metatarsal on oblique view. Hallux valgus deformity is present at the first metatarsophalangeal joint. Soft tissue swelling is present medial to the first metatarsophalangeal joint. Radiopaque densities are noted along the plantar aspect lateral to the head of the fifth metatarsal. IMPRESSION: 1. Soft tissue  swelling with questionable erosions along the medial aspect of the head the first metatarsal, possible osteomyelitis in the appropriate clinical setting.  2. Radiopaque densities along the plantar aspect of the head of the fifth metatarsal. Correlate clinically to exclude foreign bodies. Electronically Signed   By: Wyvonnia Heimlich M.D.   On: 12/16/2023 18:16     Assessment/Plan Principal Problem:   Osteomyelitis (HCC) Active Problems:   Type 1 diabetes mellitus with hyperglycemia (HCC)    Cellulitis of the right leg with possible osteomyelitis of the right foot first metatarsal head with diabetic foot ulceration -   orthopedic surgeon Dr. Julio Ohm has been consulted.  Will get MRI of the right foot.  On empiric antibiotics follow cultures. Cellulitis and abscess involving the left buttock area after waxing and has been self draining.  Was taking doxycycline .  Presently on empiric antibiotics for #1.  Closely monitor. Diabetes mellitus type 1 with hyperglycemia improved after patient self administered insulin .  Patient states she takes Tresiba 10 to 12 units at night along with sliding scale coverage.  Last hemoglobin A1c was 11.4  2 months ago.  Closely monitor. Hyponatremia and acute renal failure improved with fluids.  Continue to closely monitor. Tobacco abuse patient states she does not want to be on nicotine patch. Hypertension on lisinopril . Anemia follow CBC.  Since patient has osteomyelitis and cellulitis with diabetes mellitus will need close monitoring further workup and more than 2 midnight stay.   DVT prophylaxis: SCDs. Code Status: Full code. Family Communication: Discussed with patient. Disposition Plan: Medical floor. Consults called: Orthopedics. Admission status: Patient.

## 2023-12-16 NOTE — ED Provider Notes (Signed)
 [place] EMERGENCY DEPARTMENT AT [facility] Provider Note   CSN: [id number] Arrival date & time: 12/16/23  1527     Patient presents with: Foot Pain   Connie Vaughn is a 48 y.o. female past medical history significant for osteomyelitis and type 1 diabetes presents today for a wound to her bottom right foot.  Patient is unsure when the injury occurred but began to notice pain approximately 3 days ago.  Patient noticed today red streaking and pain up her right ankle/lower leg.  Patient reports she is currently taking doxycycline  for a different infection.  Patient denies fever, chills, nausea, vomiting, polyuria, polydipsia, or abdominal pain.    Foot Pain       Prior to Admission medications   Medication Sig Start Date End Date Taking? Authorizing Provider  b complex vitamins capsule Take 1 capsule by mouth daily.    [provider]  Blood Glucose Monitoring Suppl (BLOOD GLUCOSE MONITOR SYSTEM) w/Device KIT Use to check blood sugar three times daily 10/15/23   Amin, Ankit C, MD  Cholecalciferol (VITAMIN D3) 1.25 MG (50000 UT) CAPS Take 1 capsule by mouth once a week. 07/13/23   [provider]  docusate sodium  (COLACE) 100 MG capsule Take 1 capsule (100 mg total) by mouth 2 (two) times daily as needed for mild constipation. 10/15/23   Amin, Ankit C, MD  EPINEPHrine 0.3 mg/0.3 mL IJ SOAJ injection Inject 0.3 mg into the muscle as needed for anaphylaxis. 07/12/23   [provider]  ferrous sulfate  325 (65 FE) MG tablet Take 1 tablet (325 mg total) by mouth 2 (two) times daily with a meal. 10/15/23   Amin, Ankit C, MD  Glucose Blood (BLOOD GLUCOSE TEST STRIPS) STRP Use to check blood sugar three times daily 10/15/23   Amin, Ankit C, MD  glucose blood test strip Use as instructed Patient taking differently: 1 each by Other route in the morning, at noon, and at bedtime. Use as instructed 11/18/17   Fleming, Zelda W, NP  insulin  glargine (LANTUS ) 100 UNIT/ML  Solostar Pen Inject 25 Units into the skin daily. 10/15/23   Amin, Ankit C, MD  insulin  lispro (HUMALOG ) 100 UNIT/ML KwikPen Inject 3 Units into the skin 3 (three) times daily with meals. If eating and Blood Glucose (BG) 80 or higher inject 5 units for meal coverage and add correction dose per scale. If not eating, correction dose only. BG <150= 0 unit; BG 150-200= 1 unit; BG 201-250= 3 unit; BG 251-300= 5 unit; BG 301-350= 7 unit; BG 351-400= 9 unit; BG >400= 11 unit and Call Primary Care. 10/15/23   Amin, Ankit C, MD  Insulin  Pen Needle (B-D UF III MINI PEN NEEDLES) 31G X 5 MM MISC Use as instructed Patient taking differently: 1 each by Other route in the morning, at noon, and at bedtime. Use as instructed 11/18/17   Fleming, Zelda W, NP  Insulin  Pen Needle (PEN NEEDLES) 31G X 5 MM MISC Use to inject insulin  three times daily. 10/15/23   Maggie Schooner, MD  Lancet Device MISC 1 each by Does not apply route 3 (three) times daily. May dispense any manufacturer covered by patient's insurance. 10/15/23   Maggie Schooner, MD  Lancets MISC Use to check blood sugar 3 times daily 10/15/23   Amin, Ankit C, MD  lisinopril  (ZESTRIL ) 20 MG tablet Take 1 tablet (20 mg total) by mouth daily. 10/15/23   Amin, Ankit C, MD  pantoprazole  (PROTONIX ) 40 MG tablet  Take 1 tablet (40 mg total) by mouth daily before breakfast. 10/15/23   Maggie Schooner, MD    Allergies: Sulfa antibiotics    Review of Systems  Skin:  Positive for wound.    Updated Vital Signs BP 120/70   Pulse 91   Temp 98.1 F (36.7 C) (Oral)   Resp 18   Ht 5' 4 (1.626 m)   Wt 62.6 kg   SpO2 98%   BMI 23.69 kg/m   Physical Exam Vitals and nursing note reviewed.  Constitutional:      General: She is not in acute distress.    Appearance: Normal appearance. She is well-developed. She is not ill-appearing, toxic-appearing or diaphoretic.  HENT:     Head: Normocephalic and atraumatic.     Right Ear: External ear normal.     Left Ear: External ear  normal.     Mouth/Throat:     Mouth: Mucous membranes are moist.     Pharynx: Oropharynx is clear.   Eyes:     Conjunctiva/sclera: Conjunctivae normal.    Cardiovascular:     Rate and Rhythm: Normal rate and regular rhythm.     Pulses: Normal pulses.  Pulmonary:     Effort: Pulmonary effort is normal. No respiratory distress.     Breath sounds: Normal breath sounds.  Abdominal:     General: There is no distension.     Palpations: Abdomen is soft.     Tenderness: There is no abdominal tenderness.   Musculoskeletal:        General: No swelling.     Cervical back: Neck supple.   Skin:    General: Skin is warm and dry.     Capillary Refill: Capillary refill takes less than 2 seconds.     Findings: Lesion present.     Comments: Patient with open wound to plantar aspect of her right foot just proximal to her 2nd and third toes.  Patient also has red streaking and mild swelling around her right great toe and up the dorsal aspect of her right foot into her anterior ankle and shin.  Patient is neurovascularly intact.  +2 dorsalis pedis pulses.   Neurological:     General: No focal deficit present.     Mental Status: She is alert.   Psychiatric:        Mood and Affect: Mood normal.     (all labs ordered are listed, but only abnormal results are displayed) Labs Reviewed  LACTIC ACID, PLASMA - Abnormal; Notable for the following components:      Result Value   Lactic Acid, Venous 2.6 (*)    All other components within normal limits  COMPREHENSIVE METABOLIC PANEL WITH GFR - Abnormal; Notable for the following components:   Sodium 131 (*)    Chloride 93 (*)    Glucose, Bld 622 (*)    BUN 38 (*)    Creatinine, Ser 1.21 (*)    GFR, Estimated 55 (*)    All other components within normal limits  CBC WITH DIFFERENTIAL/PLATELET - Abnormal; Notable for the following components:   WBC 11.0 (*)    Hemoglobin 11.8 (*)    Neutro Abs 8.8 (*)    All other components within normal limits   I-STAT VENOUS BLOOD GAS, ED - Abnormal; Notable for the following components:   pCO2, Ven 41.6 (*)    HCT 32.0 (*)    Hemoglobin 10.9 (*)    All other components within normal limits  CBG  MONITORING, ED - Abnormal; Notable for the following components:   Glucose-Capillary 210 (*)    All other components within normal limits  CBG MONITORING, ED - Abnormal; Notable for the following components:   Glucose-Capillary 128 (*)    All other components within normal limits  CULTURE, BLOOD (ROUTINE X 2)  CULTURE, BLOOD (ROUTINE X 2)  LACTIC ACID, PLASMA  URINALYSIS, W/ REFLEX TO CULTURE (INFECTION SUSPECTED)  PREGNANCY, URINE  BETA-HYDROXYBUTYRIC ACID  COMPREHENSIVE METABOLIC PANEL WITH GFR  CBG MONITORING, ED    EKG: None  Radiology: DG Foot Complete Right Result Date: 12/16/2023 CLINICAL DATA:  Possible osteomyelitis.  Right foot pain with CT. EXAM: RIGHT FOOT COMPLETE - 3+ VIEW COMPARISON:  None Available. FINDINGS: There is no evidence of acute fracture or dislocation. There questionable mild erosions along the medial aspect of the head of the first metatarsal on oblique view. Hallux valgus deformity is present at the first metatarsophalangeal joint. Soft tissue swelling is present medial to the first metatarsophalangeal joint. Radiopaque densities are noted along the plantar aspect lateral to the head of the fifth metatarsal. IMPRESSION: 1. Soft tissue swelling with questionable erosions along the medial aspect of the head the first metatarsal, possible osteomyelitis in the appropriate clinical setting. 2. Radiopaque densities along the plantar aspect of the head of the fifth metatarsal. Correlate clinically to exclude foreign bodies. Electronically Signed   By: Wyvonnia Heimlich M.D.   On: 12/16/2023 18:16     Procedures   Medications Ordered in the ED  linezolid (ZYVOX) IVPB 600 mg (has no administration in time range)  sodium chloride  0.9 % bolus 2,000 mL (2,000 mLs Intravenous New  Bag/Given 12/16/23 1744)  Ampicillin-Sulbactam (UNASYN) 3 g in sodium chloride  0.9 % 100 mL IVPB (3 g Intravenous New Bag/Given 12/16/23 1927)                                    Medical Decision Making Amount and/or Complexity of Data Reviewed Labs: ordered. Radiology: ordered.   This patient presents to the ED for concern of right foot wound, this involves an extensive number of treatment options, and is a complaint that carries with it a high risk of complications and morbidity.  The differential diagnosis includes osteomyelitis, diabetic foot infection, hyperglycemia, HHS, DKA    Additional history obtained:  Additional history obtained from EMR External records from outside source obtained and reviewed including previous admission documents   Lab Tests:  I Ordered, and personally interpreted labs.  The pertinent results include: Hyperglycemia 622 (believe to be error), 210, 128, hyponatremia 131 corrected to 139 in the setting of hyperglycemia, elevated creatinine at 1.21 patient's baseline is around 0.6-0.7.  Decreased chloride at 93, elevated lactic at 2.6, leukocytosis at 11, mild anemia at 11.8   Imaging Studies ordered:  I ordered imaging studies including right foot x-ray I independently visualized and interpreted imaging which showed soft tissue swelling with questionable erosions along the medial aspect of the head of the first metatarsal, possible osteomyelitis.  Radiopaque densities along the plantar aspect of the head of the fifth metatarsal. I agree with the radiologist interpretation   Problem List / ED Course / Critical interventions / Medication management I ordered medication including Unasyn and linezolid Reevaluation of the patient after these medicines showed that the patient stayed the same I have reviewed the patients home medicines and have made adjustments as needed   Consultations Obtained:  I requested consultation with the hospitalist, Dr. Marquette Sites,   and discussed lab and imaging findings as well as pertinent plan - they recommend: Repeat CMP and agreeable to admission Consulted orthopedics, Dr. Rozelle Corning who agreed to round on patient while inpatient and requested admission to [facility].   Test / Admission - Considered:  Admit for osteomyelitis     Final diagnoses:  Osteomyelitis of right foot, unspecified type [facility])    ED Discharge Orders     None          Merryl Abraham 12/16/23 Gregoria Leas, MD 12/18/23 [phone]

## 2023-12-16 NOTE — ED Triage Notes (Signed)
 Pt via pov from home with right foot pain.  She isn't sure when it happened, but noticed pain 3 days ago and thought it was muscular. Found the cut and then today the pain started going up her right leg. Pt is T1D. Pt alert & oriented, nad noted.

## 2023-12-16 NOTE — ED Notes (Signed)
 Pt requesting no more CBG checks via finger stick after this one. Pt requests to use her dexcom instead. Currently pt's dexcom is reading 189, while our reading is 210. RN notified.

## 2023-12-16 NOTE — ED Notes (Signed)
 Report called over to [facility] and given to Fadila

## 2023-12-16 NOTE — ED Notes (Signed)
 Pt refused to answer screening and safety questions.

## 2023-12-16 NOTE — ED Notes (Signed)
 [facility] is otw to transport patient to [facility] rm# 3

## 2023-12-17 ENCOUNTER — Inpatient Hospital Stay (HOSPITAL_COMMUNITY)

## 2023-12-17 ENCOUNTER — Encounter (INDEPENDENT_AMBULATORY_CARE_PROVIDER_SITE_OTHER): Admitting: Ophthalmology

## 2023-12-17 ENCOUNTER — Other Ambulatory Visit (HOSPITAL_COMMUNITY): Payer: Self-pay

## 2023-12-17 ENCOUNTER — Encounter (INDEPENDENT_AMBULATORY_CARE_PROVIDER_SITE_OTHER): Payer: Self-pay

## 2023-12-17 DIAGNOSIS — N179 Acute kidney failure, unspecified: Secondary | ICD-10-CM | POA: Diagnosis not present

## 2023-12-17 DIAGNOSIS — I10 Essential (primary) hypertension: Secondary | ICD-10-CM

## 2023-12-17 DIAGNOSIS — E103411 Type 1 diabetes mellitus with severe nonproliferative diabetic retinopathy with macular edema, right eye: Secondary | ICD-10-CM

## 2023-12-17 DIAGNOSIS — I7090 Unspecified atherosclerosis: Secondary | ICD-10-CM

## 2023-12-17 DIAGNOSIS — E103512 Type 1 diabetes mellitus with proliferative diabetic retinopathy with macular edema, left eye: Secondary | ICD-10-CM

## 2023-12-17 DIAGNOSIS — M869 Osteomyelitis, unspecified: Secondary | ICD-10-CM | POA: Diagnosis not present

## 2023-12-17 DIAGNOSIS — Z794 Long term (current) use of insulin: Secondary | ICD-10-CM

## 2023-12-17 DIAGNOSIS — H2513 Age-related nuclear cataract, bilateral: Secondary | ICD-10-CM

## 2023-12-17 DIAGNOSIS — L03317 Cellulitis of buttock: Secondary | ICD-10-CM | POA: Insufficient documentation

## 2023-12-17 DIAGNOSIS — E871 Hypo-osmolality and hyponatremia: Secondary | ICD-10-CM | POA: Insufficient documentation

## 2023-12-17 DIAGNOSIS — H4312 Vitreous hemorrhage, left eye: Secondary | ICD-10-CM

## 2023-12-17 DIAGNOSIS — H35033 Hypertensive retinopathy, bilateral: Secondary | ICD-10-CM

## 2023-12-17 DIAGNOSIS — L97511 Non-pressure chronic ulcer of other part of right foot limited to breakdown of skin: Secondary | ICD-10-CM

## 2023-12-17 LAB — CBC WITH DIFFERENTIAL/PLATELET
Abs Immature Granulocytes: 0.01 10*3/uL (ref 0.00–0.07)
Basophils Absolute: 0.1 10*3/uL (ref 0.0–0.1)
Basophils Relative: 1 %
Eosinophils Absolute: 0.4 10*3/uL (ref 0.0–0.5)
Eosinophils Relative: 5 %
HCT: 31.9 % — ABNORMAL LOW (ref 36.0–46.0)
Hemoglobin: 10.4 g/dL — ABNORMAL LOW (ref 12.0–15.0)
Immature Granulocytes: 0 %
Lymphocytes Relative: 42 %
Lymphs Abs: 3.1 10*3/uL (ref 0.7–4.0)
MCH: 29 pg (ref 26.0–34.0)
MCHC: 32.6 g/dL (ref 30.0–36.0)
MCV: 88.9 fL (ref 80.0–100.0)
Monocytes Absolute: 0.5 10*3/uL (ref 0.1–1.0)
Monocytes Relative: 7 %
Neutro Abs: 3.3 10*3/uL (ref 1.7–7.7)
Neutrophils Relative %: 45 %
Platelets: 329 10*3/uL (ref 150–400)
RBC: 3.59 MIL/uL — ABNORMAL LOW (ref 3.87–5.11)
RDW: 14 % (ref 11.5–15.5)
WBC: 7.2 10*3/uL (ref 4.0–10.5)
nRBC: 0 % (ref 0.0–0.2)

## 2023-12-17 LAB — BASIC METABOLIC PANEL WITH GFR
Anion gap: 6 (ref 5–15)
BUN: 19 mg/dL (ref 6–20)
CO2: 26 mmol/L (ref 22–32)
Calcium: 8.2 mg/dL — ABNORMAL LOW (ref 8.9–10.3)
Chloride: 105 mmol/L (ref 98–111)
Creatinine, Ser: 0.75 mg/dL (ref 0.44–1.00)
GFR, Estimated: >60 mL/min (ref >60)
Glucose, Bld: 69 mg/dL — ABNORMAL LOW (ref 70–99)
Potassium: 4.1 mmol/L (ref 3.5–5.1)
Sodium: 137 mmol/L (ref 135–145)

## 2023-12-17 LAB — VAS US ABI WITH/WO TBI
Left ABI: 1.04
Right ABI: 1.19

## 2023-12-17 MED ORDER — CIPROFLOXACIN HCL 500 MG PO TABS
500.0000 mg | ORAL_TABLET | Freq: Two times a day (BID) | ORAL | Status: DC
  Administered 2023-12-17: 500 mg via ORAL
  Filled 2023-12-17: qty 1

## 2023-12-17 MED ORDER — VANCOMYCIN HCL 750 MG/150ML IV SOLN
750.0000 mg | Freq: Two times a day (BID) | INTRAVENOUS | Status: DC
  Filled 2023-12-17: qty 150

## 2023-12-17 MED ORDER — CIPROFLOXACIN HCL 500 MG PO TABS
500.0000 mg | ORAL_TABLET | Freq: Two times a day (BID) | ORAL | 0 refills | Status: AC
  Filled 2023-12-17: qty 20, 10d supply, fill #0

## 2023-12-17 MED ORDER — LISINOPRIL 20 MG PO TABS
20.0000 mg | ORAL_TABLET | Freq: Every day | ORAL | Status: DC
  Administered 2023-12-17: 20 mg via ORAL
  Filled 2023-12-17: qty 1

## 2023-12-17 MED ORDER — INSULIN ASPART 100 UNIT/ML IJ SOLN: 4.0000 [IU] | Freq: Three times a day (TID) | INTRAMUSCULAR | Status: DC

## 2023-12-17 MED ORDER — POLYETHYLENE GLYCOL 3350 17 G PO PACK: 17.0000 g | PACK | Freq: Two times a day (BID) | ORAL | Status: DC

## 2023-12-17 MED ORDER — INSULIN ASPART 100 UNIT/ML IJ SOLN: 0.0000 [IU] | Freq: Three times a day (TID) | INTRAMUSCULAR | Status: DC

## 2023-12-17 MED ORDER — DOXYCYCLINE HYCLATE 100 MG PO TABS
100.0000 mg | ORAL_TABLET | Freq: Two times a day (BID) | ORAL | 0 refills | Status: AC
  Filled 2023-12-17: qty 20, 10d supply, fill #0

## 2023-12-17 MED ORDER — SODIUM CHLORIDE 0.9 % IV SOLN: 2.0000 g | INTRAVENOUS | Status: DC

## 2023-12-17 MED ORDER — INSULIN GLARGINE-YFGN 100 UNIT/ML ~~LOC~~ SOLN
10.0000 [IU] | Freq: Every day | SUBCUTANEOUS | Status: DC
  Filled 2023-12-17: qty 0.1

## 2023-12-17 MED ORDER — LACTATED RINGERS IV SOLN: INTRAVENOUS | Status: DC

## 2023-12-17 MED ORDER — INSULIN ASPART 100 UNIT/ML IJ SOLN: 0.0000 [IU] | Freq: Every day | INTRAMUSCULAR | Status: DC

## 2023-12-17 MED ORDER — VANCOMYCIN HCL 1250 MG/250ML IV SOLN
1250.0000 mg | Freq: Once | INTRAVENOUS | Status: AC
  Administered 2023-12-17: 1250 mg via INTRAVENOUS
  Filled 2023-12-17: qty 250

## 2023-12-17 MED ORDER — DOXYCYCLINE HYCLATE 100 MG PO TABS
100.0000 mg | ORAL_TABLET | Freq: Two times a day (BID) | ORAL | Status: DC
  Administered 2023-12-17: 100 mg via ORAL
  Filled 2023-12-17: qty 1

## 2023-12-17 MED ORDER — INSULIN GLARGINE-YFGN 100 UNIT/ML ~~LOC~~ SOLN
10.0000 [IU] | Freq: Two times a day (BID) | SUBCUTANEOUS | Status: DC
  Filled 2023-12-17 (×2): qty 0.1

## 2023-12-17 MED ORDER — SODIUM CHLORIDE 0.9 % IV SOLN
1.0000 g | INTRAVENOUS | Status: DC
  Administered 2023-12-17: 1 g via INTRAVENOUS
  Filled 2023-12-17: qty 10

## 2023-12-17 MED ORDER — SODIUM CHLORIDE 0.9 % IV SOLN
2.0000 g | Freq: Three times a day (TID) | INTRAVENOUS | Status: DC
  Administered 2023-12-17: 2 g via INTRAVENOUS
  Filled 2023-12-17: qty 12.5

## 2023-12-17 NOTE — Discharge Summary (Signed)
 Physician Discharge Summary  Connie Vaughn:[id number] DOB: 05/03/1976 DOA: 12/16/2023  PCP: Mordechai April, DO  Admit date: 12/16/2023 Discharge date: 12/17/2023  Admitted From: Home Disposition:  Home  Recommendations for Outpatient Follow-up:  Follow up with PCP in 1-2 weeks Please obtain BMP/CBC in one week   Home Health:No Equipment/Devices:None  Discharge Condition:Stable CODE STATUS:Full Diet recommendation: Heart Healthy   Brief/Interim Summary: 48 y.o. female past medical history significant for diabetes mellitus type 1, essential hypertension and tobacco abuse comes in to the ED with right foot swollen and ulcer that she noticed about 3 days prior to admission progressively getting worse to swelling erythematous and now painful   Discharge Diagnoses:  Principal Problem:   Osteomyelitis (HCC) Active Problems:   Hyperlipidemia   Type 1 diabetes mellitus with hyperglycemia (HCC)   Anemia   Essential hypertension   Cellulitis of buttock, left   ARF (acute renal failure) (HCC)   Hyponatremia   Non-pressure chronic ulcer of other part of right foot limited to breakdown of skin (HCC)  Severe sepsis of the left lower extremity: X-ray showed possible first metatarsal head erosion. MRI was done that shows no osteomyelitis no effusion. Orthopedic surgery was consulted recommended conservative management with antibiotics. Blood cultures were sent she remained afebrile with no leukocytosis. Initially she was started on IV vancomycin  linezolid and cefepime . She was de-escalated to oral ciprofloxacin  and Doxy which should continue for 10 days and outpatient.  Left buttock history of abscess: She was previous on doxycycline  the area looks clean no erythema no tenderness no fluctuation.  Diabetes mellitus type 1: No changes made to her medication, her blood glucose was originally 600 she was started on IV fluids and insulin  her blood glucose came down to 124. No changes  made.  Hypovolemic hyponatremia: Likely also a component of pseudohyponatremia that resolved with IV fluids.  Acute kidney injury: Likely hemodynamically mediated, ACE inhibitor was held she was fluid resuscitated her creatinine returned to baseline.  Tobacco abuse: She has been counseled.  Hypertension:  no changes made to her medication.  Normocytic anemia: Demonstrating female likely iron deficiency follow-up with PCP as an outpatient.  Discharge Instructions   Allergies as of 12/17/2023       Reactions   Sulfa Antibiotics Hives        Medication List     TAKE these medications    b complex vitamins capsule Take 1 capsule by mouth daily.   Basaglar  KwikPen 100 UNIT/ML Inject 25 Units into the skin daily.   Blood Glucose Monitor System w/Device Kit Use to check blood sugar three times daily   ciprofloxacin  500 MG tablet Commonly known as: CIPRO  Take 1 tablet (500 mg total) by mouth 2 (two) times daily for 10 days.   docusate sodium  100 MG capsule Commonly known as: COLACE Take 1 capsule (100 mg total) by mouth 2 (two) times daily as needed for mild constipation.   doxycycline  100 MG tablet Commonly known as: VIBRA -TABS Take 1 tablet (100 mg total) by mouth 2 (two) times daily for 10 days.   EPINEPHrine 0.3 mg/0.3 mL Soaj injection Commonly known as: EPI-PEN Inject 0.3 mg into the muscle as needed for anaphylaxis.   FeroSul 325 (65 Fe) MG tablet Generic drug: ferrous sulfate  Take 1 tablet (325 mg total) by mouth 2 (two) times daily with a meal.   glucose blood test strip Use as instructed What changed:  how much to take how to take this when to take this  BLOOD GLUCOSE TEST STRIPS Strp Use to check blood sugar three times daily What changed: Another medication with the same name was changed. Make sure you understand how and when to take each.   insulin  lispro 100 UNIT/ML KwikPen Commonly known as: HUMALOG  Inject 3 Units into the skin 3  (three) times daily with meals. If eating and Blood Glucose (BG) 80 or higher inject 5 units for meal coverage and add correction dose per scale. If not eating, correction dose only. BG <150= 0 unit; BG 150-200= 1 unit; BG 201-250= 3 unit; BG 251-300= 5 unit; BG 301-350= 7 unit; BG 351-400= 9 unit; BG >400= 11 unit and Call Primary Care.   Insulin  Pen Needle 31G X 5 MM Misc Commonly known as: B-D UF III MINI PEN NEEDLES Use as instructed What changed:  how much to take how to take this when to take this   B-D UF III MINI PEN NEEDLES 31G X 5 MM Misc Generic drug: Insulin  Pen Needle Use to inject insulin  three times daily. What changed: Another medication with the same name was changed. Make sure you understand how and when to take each.   Lancets Misc Use to check blood sugar 3 times daily   lisinopril  20 MG tablet Commonly known as: ZESTRIL  Take 1 tablet (20 mg total) by mouth daily.   OneTouch Delica Plus Lancing Misc 1 each by Does not apply route 3 (three) times daily. May dispense any manufacturer covered by patient's insurance.   pantoprazole  40 MG tablet Commonly known as: Protonix  Take 1 tablet (40 mg total) by mouth daily before breakfast.   traMADol 50 MG tablet Commonly known as: ULTRAM Take 50 mg by mouth daily as needed.   Tresiba FlexTouch 100 UNIT/ML FlexTouch Pen Generic drug: insulin  degludec Inject 10-12 Units into the skin at bedtime.   Vitamin D3 1.25 MG (50000 UT) Caps Take 1 capsule by mouth once a week.        Follow-up Information     Timothy Ford, MD. Call in 1 week(s).   Specialty: Orthopedic Surgery Contact information: [address] [place]  [place] [address] [phone]                Allergies  Allergen Reactions   Sulfa Antibiotics Hives    Consultations: Ortho   Procedures/Studies: MR FOOT RIGHT WO CONTRAST Result Date: 12/17/2023 CLINICAL DATA:  Ulcer on the plantar aspect of the right great toe. History of  diabetes. EXAM: MRI OF THE RIGHT FOREFOOT WITHOUT CONTRAST TECHNIQUE: Multiplanar, multisequence MR imaging of the right foot was performed. No intravenous contrast was administered. COMPARISON:  Right foot radiographs dated 12/16/2023 FINDINGS: Bones/Joint/Cartilage No acute fracture or dislocation. Hallux valgus deformity with degenerative changes of the first MTP joint with joint effusion and first metatarsal-medial hallux sesamoid articulation joint space narrowing with subchondral marrow edema of the medial hallux sesamoid and the articulating plantar aspect of the first metatarsal head, which may be secondary to degenerative change, however, osteomyelitis cannot be excluded given the presence of overlying plantar soft tissue wound. No appreciable erosive changes. The remainder of the visualized bones demonstrate normal marrow signal intensity. Ligaments Collateral ligaments are intact.  Lisfranc ligament is intact. Muscles and Tendons Flexor and extensor compartment tendons are intact. No tenosynovitis. Nonspecific increased T2 signal of the abductor hallucis and flexor hallucis muscles. Soft tissue Soft tissue wound at the plantar medial forefoot, overlying the level of the first MTP joint with surrounding soft tissue edema, concerning for cellulitis. No  abscess. No soft tissue mass. IMPRESSION: 1. Soft tissue wound at the plantar medial forefoot overlying the level of the first MTP joint with surrounding soft tissue edema, suspicious for cellulitis. No abscess. 2. Subchondral marrow edema of the medial hallux sesamoid and the articulating plantar aspect of the first metatarsal head, which may be secondary to degenerative changes with hallux valgus deformity, however, osteomyelitis cannot be excluded given the presence of overlying plantar soft tissue wound. 3. Mild-to-moderate degenerative changes of the first MTP joint with joint effusion. Electronically Signed   By: Mannie Seek M.D.   On: 12/17/2023  11:19   DG Foot Complete Right Result Date: 12/16/2023 CLINICAL DATA:  Possible osteomyelitis.  Right foot pain with CT. EXAM: RIGHT FOOT COMPLETE - 3+ VIEW COMPARISON:  None Available. FINDINGS: There is no evidence of acute fracture or dislocation. There questionable mild erosions along the medial aspect of the head of the first metatarsal on oblique view. Hallux valgus deformity is present at the first metatarsophalangeal joint. Soft tissue swelling is present medial to the first metatarsophalangeal joint. Radiopaque densities are noted along the plantar aspect lateral to the head of the fifth metatarsal. IMPRESSION: 1. Soft tissue swelling with questionable erosions along the medial aspect of the head the first metatarsal, possible osteomyelitis in the appropriate clinical setting. 2. Radiopaque densities along the plantar aspect of the head of the fifth metatarsal. Correlate clinically to exclude foreign bodies. Electronically Signed   By: Wyvonnia Heimlich M.D.   On: 12/16/2023 18:16   Subjective: No complains  Discharge Exam: Vitals:   12/17/23 0303 12/17/23 0757  BP: (!) 157/85 (!) 139/100  Pulse: 81 78  Resp: 15 16  Temp: 98.2 F (36.8 C) (!) 97.5 F (36.4 C)  SpO2: 96% 100%   Vitals:   12/16/23 2130 12/16/23 2327 12/17/23 0303 12/17/23 0757  BP: (!) 155/91 (!) 166/94 (!) 157/85 (!) 139/100  Pulse: 80 77 81 78  Resp: 15 15 15 16   Temp: 98 F (36.7 C) 97.6 F (36.4 C) 98.2 F (36.8 C) (!) 97.5 F (36.4 C)  TempSrc:      SpO2: 98% 100% 96% 100%  Weight:      Height:        General: Pt is alert, awake, not in acute distress Cardiovascular: RRR, S1/S2 +, no rubs, no gallops Respiratory: CTA bilaterally, no wheezing, no rhonchi Abdominal: Soft, NT, ND, bowel sounds + Extremities: no edema, no cyanosis    The results of significant diagnostics from this hospitalization (including imaging, microbiology, ancillary and laboratory) are listed below for reference.      Microbiology: Recent Results (from the past 240 hours)  Blood Cultures x 2 sites     Status: None (Preliminary result)   Collection Time: 12/16/23  6:55 PM   Specimen: BLOOD  Result Value Ref Range Status   Specimen Description   Final    BLOOD RIGHT ANTECUBITAL Performed at [facility], [address], [place], [place] [address]    Special Requests   Final    Blood Culture results may not be optimal due to an inadequate volume of blood received in culture bottles BOTTLES DRAWN AEROBIC AND ANAEROBIC Performed at [facility], [address], [place], [place] [address]    Culture   Final    NO GROWTH < 12 HOURS Performed at [facility], [address], [place], [place] [address]    Report Status PENDING  Incomplete  Blood Cultures x 2 sites  Status: None (Preliminary result)   Collection Time: 12/16/23  7:00 PM   Specimen: BLOOD  Result Value Ref Range Status   Specimen Description   Final    BLOOD BLOOD LEFT FOREARM Performed at [facility], [address], [place], [place] [address]    Special Requests   Final    Blood Culture adequate volume BOTTLES DRAWN AEROBIC ONLY Performed at [facility], [address], [place], [place] [address]    Culture   Final    NO GROWTH < 12 HOURS Performed at [facility], [address], [place], [place] [address]    Report Status PENDING  Incomplete     Labs: BNP (last 3 results) No results for input(s): BNP in the last 8760 hours. Basic Metabolic Panel: Recent Labs  Lab 12/16/23 1543 12/16/23 1737 12/16/23 2004 12/17/23 0520  NA 131* 136 139 137  K 5.1 3.9 4.2 4.1  CL 93*  --  104 105  CO2 23  --  25 26  GLUCOSE 622*  --  111* 69*  BUN 38*  --  31* 19  CREATININE 1.21*  --  0.83 0.75  CALCIUM 9.2  --  8.5* 8.2*   Liver Function Tests: Recent Labs  Lab 12/16/23 1543 12/16/23 2004  AST 17 13*  ALT 11 8  ALKPHOS  118 99  BILITOT <0.2 <0.2  PROT 7.3 6.1*  ALBUMIN 3.8 3.3*   No results for input(s): LIPASE, AMYLASE in the last 168 hours. No results for input(s): AMMONIA in the last 168 hours. CBC: Recent Labs  Lab 12/16/23 1543 12/16/23 1737 12/17/23 0519  WBC 11.0*  --  7.2  NEUTROABS 8.8*  --  3.3  HGB 11.8* 10.9* 10.4*  HCT 36.2 32.0* 31.9*  MCV 88.9  --  88.9  PLT 376  --  329   Cardiac Enzymes: No results for input(s): CKTOTAL, CKMB, CKMBINDEX, TROPONINI in the last 168 hours. BNP: Invalid input(s): POCBNP CBG: Recent Labs  Lab 12/16/23 1814 12/16/23 1926  GLUCAP 210* 128*   D-Dimer No results for input(s): DDIMER in the last 72 hours. Hgb A1c No results for input(s): HGBA1C in the last 72 hours. Lipid Profile No results for input(s): CHOL, HDL, LDLCALC, TRIG, CHOLHDL, LDLDIRECT in the last 72 hours. Thyroid function studies No results for input(s): TSH, T4TOTAL, T3FREE, THYROIDAB in the last 72 hours.  Invalid input(s): FREET3 Anemia work up No results for input(s): VITAMINB12, FOLATE, FERRITIN, TIBC, IRON, RETICCTPCT in the last 72 hours. Urinalysis    Component Value Date/Time   COLORURINE COLORLESS (A) 12/16/2023 2023   APPEARANCEUR CLEAR 12/16/2023 2023   LABSPEC 1.014 12/16/2023 2023   PHURINE 6.0 12/16/2023 2023   GLUCOSEU >1,000 (A) 12/16/2023 2023   HGBUR TRACE (A) 12/16/2023 2023   BILIRUBINUR NEGATIVE 12/16/2023 2023   KETONESUR NEGATIVE 12/16/2023 2023   PROTEINUR TRACE (A) 12/16/2023 2023   NITRITE NEGATIVE 12/16/2023 2023   LEUKOCYTESUR NEGATIVE 12/16/2023 2023   Sepsis Labs Recent Labs  Lab 12/16/23 1543 12/17/23 0519  WBC 11.0* 7.2   Microbiology Recent Results (from the past 240 hours)  Blood Cultures x 2 sites     Status: None (Preliminary result)   Collection Time: 12/16/23  6:55 PM   Specimen: BLOOD  Result Value Ref Range Status   Specimen Description   Final    BLOOD RIGHT  ANTECUBITAL Performed at [facility], [address] [place], [place] [address]    Special Requests  Final    Blood Culture results may not be optimal due to an inadequate volume of blood received in culture bottles BOTTLES DRAWN AEROBIC AND ANAEROBIC Performed at [facility], [address], [place], [place] [address]    Culture   Final    NO GROWTH < 12 HOURS Performed at [facility], [address] [place], [place] [address]    Report Status PENDING  Incomplete  Blood Cultures x 2 sites     Status: None (Preliminary result)   Collection Time: 12/16/23  7:00 PM   Specimen: BLOOD  Result Value Ref Range Status   Specimen Description   Final    BLOOD BLOOD LEFT FOREARM Performed at [facility], [address] [place], [place] [address]    Special Requests   Final    Blood Culture adequate volume BOTTLES DRAWN AEROBIC ONLY Performed at [facility], [address] [place], [place] [address]    Culture   Final    NO GROWTH < 12 HOURS Performed at [facility], [address], [place], [place] [address]    Report Status PENDING  Incomplete     Time coordinating discharge: Over 30 minutes  SIGNED:   Macdonald Savoy, MD  [facility] 12/17/2023, 12:45 PM Pager   If 7PM-7AM, please contact night-coverage [email or web address] Password [email or web address]

## 2023-12-17 NOTE — Progress Notes (Signed)
 Pharmacy Antibiotic Note  Connie Vaughn is a 48 y.o. female admitted on 12/16/2023 with concern for osteomyelitis.  Pharmacy has been consulted for vancomycin  and cefepime  dosing.  Plan: Vancomycin  1250mg  x1 then 750mg  IV Q12H. Goal AUC 400-550.  Expected AUC 515. Cefepime  2g IV Q8H.  Height: 5' 4 (162.6 cm) Weight: 62.6 kg (138 lb) IBW/kg (Calculated) : 54.7  Temp (24hrs), Avg:98 F (36.7 C), Min:97.6 F (36.4 C), Max:98.2 F (36.8 C)  Recent Labs  Lab 12/16/23 1543 12/16/23 2004  WBC 11.0*  --   CREATININE 1.21* 0.83  LATICACIDVEN 2.6* 1.1    Estimated Creatinine Clearance: 72.4 mL/min (by C-G formula based on SCr of 0.83 mg/dL).    Allergies  Allergen Reactions   Sulfa Antibiotics Hives    Thank you for allowing pharmacy to be a part of this patient's care.  Lonnie Roberts, PharmD, BCPS  12/17/2023 3:09 AM

## 2023-12-17 NOTE — Inpatient Diabetes Management (Signed)
 Inpatient Diabetes Program Recommendations  AACE/ADA: New Consensus Statement on Inpatient Glycemic Control (2015)  Target Ranges:  Prepandial:   less than 140 mg/dL      Peak postprandial:   less than 180 mg/dL (1-2 hours)      Critically ill patients:  140 - 180 mg/dL   Lab Results  Component Value Date   GLUCAP 128 (H) 12/16/2023   HGBA1C 11.4 (H) 10/12/2023    Review of Glycemic Control  Diabetes history: type 1 Outpatient Diabetes medications: Tresiba per sliding scale daily, Lymjev sliding scale every time patient eats (verified by patient) Current orders for Inpatient glycemic control: Semglee  10 units BID, Novolog  0-15 correction scale TID, Novolog  0-5 units HS scale, Novolog  4 units TID with meals.  Inpatient Diabetes Program Recommendations:   Spoke briefly with patient at the bedside to verify insulin  dosages and brands of insulin  taking. Patient states that her blood sugar was high on admission due to the infection in her foot. Patient was not interested in discussing her diabetes with coordinator.   Nick Barman RN BSN CDE Diabetes Coordinator Pager: [phone]  8am-5pm

## 2023-12-17 NOTE — Progress Notes (Signed)
 Discharge instructions reviewed with pt and her family. Copy of instructions given to pt. [facility] has filled her scripts and will be brought up to the unit to the pt.  Pt will be d/c'd via wheelchair with belongings and will be escorted by staff. Pt waiting for scripts to be brought up to room.   Fleta Human, RN SWOT

## 2023-12-17 NOTE — Progress Notes (Signed)
 Orthopedic Tech Progress Note Patient Details:  Connie Vaughn 1976-03-22 [id number] Applied post op shoe per order.  Ortho Devices Type of Ortho Device: Postop shoe/boot Ortho Device/Splint Location: LLE Ortho Device/Splint Interventions: Ordered, Application, Adjustment   Post Interventions Patient Tolerated: Well Instructions Provided: Adjustment of device, Care of device, Poper ambulation with device  Rayna Calkin 12/17/2023, 6:33 PM

## 2023-12-17 NOTE — Consult Note (Signed)
 [facility] NOTE   MRN : [id number]  Reason for Consult: B GT wounds right GT edema with cellulitis Referring Physician: Dr. Bonita Bussing IM  History of Present Illness: Connie Vaughn is a 48 y.o. female with history of diabetes mellitus type 1, hypertension, tobacco abuse presents to the ER with complaints of right foot swelling.  She works at a pool and walks around barefoot often.  She scrapped her right GT about 3-4 days ago on the plantar surface and placed a band aide on it.  When she removed the band aide she also removed some skin.  She notice swelling and redness.  She reported to the ED at [facility] on 12/16/23.       Current Facility-Administered Medications  Medication Dose Route Frequency Provider Last Rate Last Admin   cefTRIAXone  (ROCEPHIN ) 1 g in sodium chloride  0.9 % 100 mL IVPB  1 g Intravenous Q24H Macdonald Savoy, MD       insulin  aspart (novoLOG ) injection 0-15 Units  0-15 Units Subcutaneous TID WC Macdonald Savoy, MD       insulin  aspart (novoLOG ) injection 0-5 Units  0-5 Units Subcutaneous QHS Macdonald Savoy, MD       insulin  aspart (novoLOG ) injection 4 Units  4 Units Subcutaneous TID WC Macdonald Savoy, MD       insulin  glargine-yfgn (SEMGLEE ) injection 10 Units  10 Units Subcutaneous BID Macdonald Savoy, MD       lactated ringers  infusion   Intravenous Continuous Angelene Kelly, MD 75 mL/hr at 12/17/23 0314 New Bag at 12/17/23 0314   lisinopril  (ZESTRIL ) tablet 20 mg  20 mg Oral Daily Angelene Kelly, MD   20 mg at 12/17/23 0840   polyethylene glycol (MIRALAX  / GLYCOLAX ) packet 17 g  17 g Oral BID Macdonald Savoy, MD       vancomycin  (VANCOREADY) IVPB 750 mg/150 mL  750 mg Intravenous Q12H Lynna Sarks, [facility]          Past Medical History:  Diagnosis Date   ADHD (attention deficit hyperactivity disorder)    AKI (acute kidney injury) (HCC) 10/06/2020   Anemia    Blood transfusion  without reported diagnosis    Constipation    Diabetes mellitus type 1, uncontrolled    DKA (diabetic ketoacidosis) (HCC) 10/06/2020   GERD (gastroesophageal reflux disease)    Hyperlipidemia    Incontinence, feces     Past Surgical History:  Procedure Laterality Date   ANAL RECTAL MANOMETRY N/A 07/29/2023   Procedure: ANO RECTAL MANOMETRY;  Surgeon: Sergio Dandy, MD;  Location: [facility];  Service: Gastroenterology;  Laterality: N/A;   I & D EXTREMITY Right 03/09/2021   Procedure: IRRIGATION AND DEBRIDEMENT RIGHT RING FINGER;  Surgeon: Brunilda Capra, MD;  Location: [facility] OR;  Service: Orthopedics;  Laterality: Right;   TUBAL LIGATION      Social History Social History   Tobacco Use   Smoking status: Some Days    Current packs/day: 0.25    Average packs/day: 0.3 packs/day for 2.0 years (0.5 ttl pk-yrs)    Types: Cigarettes   Smokeless tobacco: Never   Tobacco comments:    also uses Black and Mild  Vaping Use   Vaping status: Never Used  Substance Use Topics   Alcohol use: Never   Drug use: Yes    Comment: CBD edibles    Family History Family History  Problem Relation Age of Onset  Heart disease Mother    Congestive Heart Failure Mother    Congestive Heart Failure Sister    Diabetes Maternal Grandmother    Colon polyps Neg Hx    Crohn's disease Neg Hx    Esophageal cancer Neg Hx    Rectal cancer Neg Hx    Stomach cancer Neg Hx     Allergies  Allergen Reactions   Sulfa Antibiotics Hives     REVIEW OF SYSTEMS  General: [ ]  Weight loss, [ ]  Fever, [ ]  chills Neurologic: [ ]  Dizziness, [ ]  Blackouts, [ ]  Seizure [ ]  Stroke, [ ]  Mini stroke, [ ]  Slurred speech, [ ]  Temporary blindness; [ ]  weakness in arms or legs, [ ]  Hoarseness [ ]  Dysphagia Cardiac: [ ]  Chest pain/pressure, [ ]  Shortness of breath at rest [ ]  Shortness of breath with exertion, [ ]  Atrial fibrillation or irregular heartbeat  Vascular: [ ]  Pain in legs with walking, [ ]  Pain in legs at  rest, [ ]  Pain in legs at night,  [ ]  Non-healing ulcer, [ ]  Blood clot in vein/DVT,   Pulmonary: [ ]  Home oxygen, [ ]  Productive cough, [ ]  Coughing up blood, [ ]  Asthma,  [ ]  Wheezing [ ]  COPD Musculoskeletal:  [ ]  Arthritis, [ ]  Low back pain, [ ]  Joint pain Hematologic: [ ]  Easy Bruising, [ ]  Anemia; [ ]  Hepatitis Gastrointestinal: [ ]  Blood in stool, [ ]  Gastroesophageal Reflux/heartburn, Urinary: [ ]  chronic Kidney disease, [ ]  on HD - [ ]  MWF or [ ]  TTHS, [ ]  Burning with urination, [ ]  Difficulty urinating Skin: [ ]  Rashes, [ ]  Wounds Psychological: [ ]  Anxiety, [ ]  Depression  Physical Examination Vitals:   12/16/23 2130 12/16/23 2327 12/17/23 0303 12/17/23 0757  BP: (!) 155/91 (!) 166/94 (!) 157/85 (!) 139/100  Pulse: 80 77 81 78  Resp: 15 15 15 16   Temp: 98 F (36.7 C) 97.6 F (36.4 C) 98.2 F (36.8 C) (!) 97.5 F (36.4 C)  TempSrc:      SpO2: 98% 100% 96% 100%  Weight:      Height:       Body mass index is 23.69 kg/m.  General:  WDWN in NAD Gait: Normal HENT: WNL Eyes: Pupils equal Pulmonary: normal non-labored breathing , without Rales, rhonchi,  wheezing Cardiac: RRR, without  Murmurs, rubs or gallops; No carotid bruits Abdomen: soft, NT, no masses Skin:        Vascular Exam/Pulses:Palpable pedal pulses   Musculoskeletal: no muscle wasting or atrophy; no edema  Neurologic: A&O X 3; Appropriate Affect ;  SENSATION: normal; MOTOR FUNCTION: 5/5 Symmetric Speech is fluent/normal   Significant Diagnostic Studies: CBC Lab Results  Component Value Date   WBC 7.2 12/17/2023   HGB 10.4 (L) 12/17/2023   HCT 31.9 (L) 12/17/2023   MCV 88.9 12/17/2023   PLT 329 12/17/2023    BMET    Component Value Date/Time   NA 137 12/17/2023 0520   K 4.1 12/17/2023 0520   CL 105 12/17/2023 0520   CO2 26 12/17/2023 0520   GLUCOSE 69 (L) 12/17/2023 0520   BUN 19 12/17/2023 0520   CREATININE 0.75 12/17/2023 0520   CALCIUM 8.2 (L) 12/17/2023 0520   GFRNONAA  >60 12/17/2023 0520   GFRAA >60 10/17/2017 0600   Estimated Creatinine Clearance: 75.1 mL/min (by C-G formula based on SCr of 0.75 mg/dL).  COAG Lab Results  Component Value Date   INR 1.0 10/12/2023  INR 0.8 03/09/2021     Non-Invasive Vascular Imaging:  X ray IMPRESSION: 1. Soft tissue swelling with questionable erosions along the medial aspect of the head the first metatarsal, possible osteomyelitis in the appropriate clinical setting. 2. Radiopaque densities along the plantar aspect of the head of the fifth metatarsal. Correlate clinically to exclude foreign bodies.  MRI  IMPRESSION: 1. Soft tissue wound at the plantar medial forefoot overlying the level of the first MTP joint with surrounding soft tissue edema, suspicious for cellulitis. No abscess. 2. Subchondral marrow edema of the medial hallux sesamoid and the articulating plantar aspect of the first metatarsal head, which may be secondary to degenerative changes with hallux valgus deformity, however, osteomyelitis cannot be excluded given the presence of overlying plantar soft tissue wound. 3. Mild-to-moderate degenerative changes of the first MTP joint with joint effusion.  ASSESSMENT/PLAN:  Right GT cellulitis, DM No obvious osteomyelitis infection Chronic callus left plantar GT No indication for surgical intervention. Antibiotics and f/u in Dr. Aniceto Kern office in 1 week.      Rocky Cipro 12/17/2023 11:21 AM

## 2023-12-17 NOTE — Progress Notes (Signed)
 Patient refused CBG checks and insulin . MD made aware.

## 2023-12-17 NOTE — Progress Notes (Signed)
 [facility] PROGRESS NOTE    Progress Note  PATRICIAANN RABANAL  WUJ:[id number] DOB: [birth year] DOA: 12/16/2023 PCP: Mordechai April, DO     Brief Narrative:   Connie Vaughn is an 48 y.o. female past medical history significant for diabetes mellitus type 1, essential hypertension and tobacco abuse comes in to the ED with right foot swollen and ulcer that she noticed about 3 days prior to admission progressively getting worse to swelling erythematous and now painful  Assessment/Plan:   Severe sepsis cellulitis of the right leg with possible Osteomyelitis of the right first metatarsal head/diabetic foot ulcer: X-ray of the foot shows erosion in the first metatarsal head. MRI of the foot is pending. Blood cultures were sent she was started empirically on vancomycin  linezolid and cefepime . Will de-escalate to IV vancomycin  and Rocephin   Cellulitis and abscess involving the left buttock: She was previously on doxycycline . There is no tenderness no fluctuation or erythema not painful to examination. This probably resolved. She has remained afebrile with leukocytosis.  Diabetes mellitus type 1 uncontrolled with hyperglycemia: On admission blood glucose of greater than 600.  Given IV fluids and IV insulin  as per glucose improved this morning 124. Start on sliding scale insulin  continue long-acting insulin .  Hypovolemic hyponatremia: Resolved with IV fluids.  Acute kidney injury: Likely hemodynamic mediated ACE inhibitor was held she was resuscitated, her pain is resolved baseline. Continue to hold ACE inhibitor as she may need surgical intervention.  Tobacco abuse: She has been counseled.  Essential hypertension: Blood pressure slightly elevated hold ACE inhibitor in the setting of acute kidney injury and possible going to surgery.  Normocytic anemia: Menstruating female likely iron deficiency, hemoglobin has remained relatively stable. DVT prophylaxis: lovenox  Family  Communication:none Status is: Inpatient Remains inpatient appropriate because: Severe sepsis    Code Status:     Code Status Orders  (From admission, onward)           Start     Ordered   12/17/23 0257  Full code  Continuous       Question:  By:  Answer:  Consent: discussion documented in EHR   12/17/23 0258           Code Status History     Date Active Date Inactive Code Status Order ID Comments User Context   10/12/2023 1845 10/15/2023 1705 Limited: Do not attempt resuscitation (DNR) -DNR-LIMITED -Do Not Intubate/DNI  [id number]  Selene Dais, MD ED   07/20/2023 1340 07/21/2023 1933 Full Code [id number]  Gaylin Ke, MD ED   06/28/2023 1017 06/29/2023 1527 Full Code [id number]  Danice Dural, MD ED   03/06/2023 2029 03/08/2023 1615 Full Code [id number]  Bary Boss, DO Inpatient   01/15/2023 1320 01/17/2023 0004 Full Code [id number]  Johnetta Nab, MD Inpatient   10/06/2020 2019 10/07/2020 1449 Full Code [id number]  Charmaine Coop, DO ED   10/16/2017 1837 10/17/2017 2001 Full Code [id number]  Bary Boss, DO Inpatient         IV Access:   Peripheral IV   Procedures and diagnostic studies:   DG Foot Complete Right Result Date: 12/16/2023 CLINICAL DATA:  Possible osteomyelitis.  Right foot pain with CT. EXAM: RIGHT FOOT COMPLETE - 3+ VIEW COMPARISON:  None Available. FINDINGS: There is no evidence of acute fracture or dislocation. There questionable mild erosions along the medial aspect of the head of the first metatarsal on oblique view. Hallux valgus deformity is present at the first metatarsophalangeal joint. Soft  tissue swelling is present medial to the first metatarsophalangeal joint. Radiopaque densities are noted along the plantar aspect lateral to the head of the fifth metatarsal. IMPRESSION: 1. Soft tissue swelling with questionable erosions along the medial aspect of the head the first metatarsal, possible osteomyelitis in the appropriate clinical  setting. 2. Radiopaque densities along the plantar aspect of the head of the fifth metatarsal. Correlate clinically to exclude foreign bodies. Electronically Signed   By: Wyvonnia Heimlich M.D.   On: 12/16/2023 18:16     Medical Consultants:   None.   Subjective:    Karren Paddock relates her toes, bother her  Objective:    Vitals:   12/16/23 2045 12/16/23 2130 12/16/23 2327 12/17/23 0303  BP: (!) 151/76 (!) 155/91 (!) 166/94 (!) 157/85  Pulse: 81 80 77 81  Resp:  15 15 15   Temp:  98 F (36.7 C) 97.6 F (36.4 C) 98.2 F (36.8 C)  TempSrc:      SpO2: 98% 98% 100% 96%  Weight:      Height:       SpO2: 96 %   Intake/Output Summary (Last 24 hours) at 12/17/2023 0709 Last data filed at 12/16/2023 2004 Gross per 24 hour  Intake 2000 ml  Output --  Net 2000 ml   Filed Weights   12/16/23 1536  Weight: 62.6 kg    Exam: General exam: In no acute distress. Respiratory system: Good air movement and clear to auscultation. Cardiovascular system: S1 & S2 heard, RRR. No JVD. Gastrointestinal system: Abdomen is nondistended, soft and nontender.  Extremities: Right foot first metatarsal swollen tender to palpation and erythematous Skin: No rashes, lesions or ulcers Psychiatry: Judgement and insight appear normal. Mood & affect appropriate.    Data Reviewed:    Labs: Basic Metabolic Panel: Recent Labs  Lab 12/16/23 1543 12/16/23 1737 12/16/23 2004  NA 131* 136 139  K 5.1 3.9 4.2  CL 93*  --  104  CO2 23  --  25  GLUCOSE 622*  --  111*  BUN 38*  --  31*  CREATININE 1.21*  --  0.83  CALCIUM 9.2  --  8.5*   GFR Estimated Creatinine Clearance: 72.4 mL/min (by C-G formula based on SCr of 0.83 mg/dL). Liver Function Tests: Recent Labs  Lab 12/16/23 1543 12/16/23 2004  AST 17 13*  ALT 11 8  ALKPHOS 118 99  BILITOT <0.2 <0.2  PROT 7.3 6.1*  ALBUMIN 3.8 3.3*   No results for input(s): LIPASE, AMYLASE in the last 168 hours. No results for input(s):  AMMONIA in the last 168 hours. Coagulation profile No results for input(s): INR, PROTIME in the last 168 hours. COVID-19 Labs  No results for input(s): DDIMER, FERRITIN, LDH, CRP in the last 72 hours.  Lab Results  Component Value Date   SARSCOV2NAA NEGATIVE 10/12/2023   SARSCOV2NAA NEGATIVE 07/20/2023   SARSCOV2NAA NEGATIVE 03/06/2023   SARSCOV2NAA NEGATIVE 03/09/2021    CBC: Recent Labs  Lab 12/16/23 1543 12/16/23 1737  WBC 11.0*  --   NEUTROABS 8.8*  --   HGB 11.8* 10.9*  HCT 36.2 32.0*  MCV 88.9  --   PLT 376  --    Cardiac Enzymes: No results for input(s): CKTOTAL, CKMB, CKMBINDEX, TROPONINI in the last 168 hours. BNP (last 3 results) No results for input(s): PROBNP in the last 8760 hours. CBG: Recent Labs  Lab 12/16/23 1814 12/16/23 1926  GLUCAP 210* 128*   D-Dimer: No results for input(s): DDIMER  in the last 72 hours. Hgb A1c: No results for input(s): HGBA1C in the last 72 hours. Lipid Profile: No results for input(s): CHOL, HDL, LDLCALC, TRIG, CHOLHDL, LDLDIRECT in the last 72 hours. Thyroid function studies: No results for input(s): TSH, T4TOTAL, T3FREE, THYROIDAB in the last 72 hours.  Invalid input(s): FREET3 Anemia work up: No results for input(s): VITAMINB12, FOLATE, FERRITIN, TIBC, IRON, RETICCTPCT in the last 72 hours. Sepsis Labs: Recent Labs  Lab 12/16/23 1543 12/16/23 2004  WBC 11.0*  --   LATICACIDVEN 2.6* 1.1   Microbiology No results found for this or any previous visit (from the past 240 hours).   Medications:    insulin  aspart  0-15 Units Subcutaneous TID WC   insulin  glargine-yfgn  10 Units Subcutaneous QHS   lisinopril   20 mg Oral Daily   Continuous Infusions:  ceFEPime  (MAXIPIME ) IV 2 g (12/17/23 0330)   lactated ringers  75 mL/hr at 12/17/23 0314   vancomycin         LOS: 1 day   Macdonald Savoy  [facility]  12/17/2023, 7:09 AM

## 2023-12-17 NOTE — Progress Notes (Signed)
 ABI's have been completed. Preliminary results can be found in CV Proc through chart review.   12/17/23 4:29 PM Birda Buffy RVT

## 2023-12-21 LAB — CULTURE, BLOOD (ROUTINE X 2)
Culture: NO GROWTH
Culture: NO GROWTH
Special Requests: ADEQUATE

## 2023-12-22 NOTE — Progress Notes (Signed)
 [facility] - Clinic Note  12/23/2023   CHIEF COMPLAINT Patient presents for Retina Follow Up  HISTORY OF PRESENT ILLNESS: Connie Vaughn is a 48 y.o. female who presents to the clinic today for:  HPI     Retina Follow Up   Patient presents with  Diabetic Retinopathy.  In left eye.  This started 6 weeks ago.  I, the attending physician,  performed the HPI with the patient and updated documentation appropriately.        Comments   Patient here for 6 weeks retina follow up for PDR OS. Patient states vision is better. Don't see big floater. The last shot was the last day saw blood splat. No eye pain. Was in hospital last week for infection in right foot that was going up leg. On antibiotics.      Last edited by Valdemar Rogue, MD on 12/23/2023 11:01 PM.    Pt states she was in the hospital for her foot a few weeks ago, they thought she had an infection in her bone at first, but she doesn't, she states her floaters have gotten better    Referring physician: Frazier, [place], OD [address] [place],  [place] [address]  HISTORICAL INFORMATION:  Selected notes from the MEDICAL RECORD NUMBER Referred by Dr. Vivian for concern of NPDR  LEE:  Ocular Hx-  Previously followed w/ [facility] -- last visit 09.26.2022 history of focal laser OU w/ [facility] -- OD 09.03.2020, 09.06.2022; OS 09.14.22  PMH-   CURRENT MEDICATIONS: No current outpatient medications on file. (Ophthalmic Drugs)   No current facility-administered medications for this visit. (Ophthalmic Drugs)   Current Outpatient Medications (Other)  Medication Sig   b complex vitamins capsule Take 1 capsule by mouth daily.   Blood Glucose Monitoring Suppl (BLOOD GLUCOSE MONITOR SYSTEM) w/Device KIT Use to check blood sugar three times daily   Cholecalciferol (VITAMIN D3) 1.25 MG (50000 UT) CAPS Take 1 capsule by mouth once a week.   ciprofloxacin  (CIPRO ) 500 MG tablet Take 1 tablet (500 mg total) by mouth 2  (two) times daily for 10 days.   docusate sodium  (COLACE) 100 MG capsule Take 1 capsule (100 mg total) by mouth 2 (two) times daily as needed for mild constipation.   doxycycline  (VIBRA -TABS) 100 MG tablet Take 1 tablet (100 mg total) by mouth 2 (two) times daily for 10 days.   EPINEPHrine 0.3 mg/0.3 mL IJ SOAJ injection Inject 0.3 mg into the muscle as needed for anaphylaxis.   ferrous sulfate  325 (65 FE) MG tablet Take 1 tablet (325 mg total) by mouth 2 (two) times daily with a meal.   Glucose Blood (BLOOD GLUCOSE TEST STRIPS) STRP Use to check blood sugar three times daily   glucose blood test strip Use as instructed   insulin  glargine (LANTUS ) 100 UNIT/ML Solostar Pen Inject 25 Units into the skin daily.   insulin  lispro (HUMALOG ) 100 UNIT/ML KwikPen Inject 3 Units into the skin 3 (three) times daily with meals. If eating and Blood Glucose (BG) 80 or higher inject 5 units for meal coverage and add correction dose per scale. If not eating, correction dose only. BG <150= 0 unit; BG 150-200= 1 unit; BG 201-250= 3 unit; BG 251-300= 5 unit; BG 301-350= 7 unit; BG 351-400= 9 unit; BG >400= 11 unit and Call Primary Care.   Insulin  Pen Needle (B-D UF III MINI PEN NEEDLES) 31G X 5 MM MISC Use as instructed (Patient taking differently:  1 each by Other route in the morning, at noon, and at bedtime. Use as instructed)   Insulin  Pen Needle (PEN NEEDLES) 31G X 5 MM MISC Use to inject insulin  three times daily.   Lancet Device MISC 1 each by Does not apply route 3 (three) times daily. May dispense any manufacturer covered by patient's insurance.   Lancets MISC Use to check blood sugar 3 times daily   lisinopril  (ZESTRIL ) 20 MG tablet Take 1 tablet (20 mg total) by mouth daily.   pantoprazole  (PROTONIX ) 40 MG tablet Take 1 tablet (40 mg total) by mouth daily before breakfast.   traMADol (ULTRAM) 50 MG tablet Take 50 mg by mouth daily as needed.   TRESIBA FLEXTOUCH 100 UNIT/ML FlexTouch Pen Inject 10-12 Units  into the skin at bedtime.   No current facility-administered medications for this visit. (Other)   REVIEW OF SYSTEMS: ROS   Positive for: Endocrine, Eyes Negative for: Constitutional, Gastrointestinal, Neurological, Skin, Genitourinary, Musculoskeletal, HENT, Cardiovascular, Respiratory, Psychiatric, Allergic/Imm, Heme/Lymph Last edited by Orval Asberry RAMAN, COA on 12/23/2023  8:51 AM.       ALLERGIES Allergies  Allergen Reactions   Sulfa Antibiotics Hives   PAST MEDICAL HISTORY Past Medical History:  Diagnosis Date   ADHD (attention deficit hyperactivity disorder)    AKI (acute kidney injury) (HCC) 10/06/2020   Anemia    Blood transfusion without reported diagnosis    Constipation    Diabetes mellitus type 1, uncontrolled    DKA (diabetic ketoacidosis) (HCC) 10/06/2020   GERD (gastroesophageal reflux disease)    Hyperlipidemia    Incontinence, feces    Past Surgical History:  Procedure Laterality Date   ANAL RECTAL MANOMETRY N/A 07/29/2023   Procedure: ANO RECTAL MANOMETRY;  Surgeon: Shila Gustav GAILS, MD;  Location: [facility];  Service: Gastroenterology;  Laterality: N/A;   I & D EXTREMITY Right 03/09/2021   Procedure: IRRIGATION AND DEBRIDEMENT RIGHT RING FINGER;  Surgeon: Murrell Drivers, MD;  Location: [facility] OR;  Service: Orthopedics;  Laterality: Right;   TUBAL LIGATION     FAMILY HISTORY Family History  Problem Relation Age of Onset   Heart disease Mother    Congestive Heart Failure Mother    Congestive Heart Failure Sister    Diabetes Maternal Grandmother    Colon polyps Neg Hx    Crohn's disease Neg Hx    Esophageal cancer Neg Hx    Rectal cancer Neg Hx    Stomach cancer Neg Hx    SOCIAL HISTORY Social History   Tobacco Use   Smoking status: Some Days    Current packs/day: 0.25    Average packs/day: 0.3 packs/day for 2.0 years (0.5 ttl pk-yrs)    Types: Cigarettes   Smokeless tobacco: Never   Tobacco comments:    also uses Black and Mild  Vaping  Use   Vaping status: Never Used  Substance Use Topics   Alcohol use: Never   Drug use: Yes    Comment: CBD edibles       OPHTHALMIC EXAM:  Base Eye Exam     Visual Acuity (Snellen - Linear)       Right Left   Dist [place] 20/20 20/40 -1   Dist ph [place]  20/25 -1         Tonometry (Tonopen, 8:46 AM)       Right Left   Pressure 12 19         Pupils       Dark Light Shape React APD  Right 4 3 Round Brisk None   Left 4 3 Round Brisk None         Visual Fields (Counting fingers)       Left Right    Full Full         Extraocular Movement       Right Left    Full, Ortho Full, Ortho         Neuro/Psych     Oriented x3: Yes   Mood/Affect: Normal         Dilation     Both eyes: 1.0% Mydriacyl, 2.5% Phenylephrine  @ 8:46 AM           Slit Lamp and Fundus Exam     Slit Lamp Exam       Right Left   Lids/Lashes Dermatochalasis - upper lid Dermatochalasis - upper lid   Conjunctiva/Sclera mild melanosis mild melanosis   Cornea Clear Clear   Anterior Chamber deep and clear deep and clear   Iris Round and dilated, No NVI Round and dilated, No NVI   Lens 1-2+ Nuclear sclerosis, 1-2+ Cortical cataract 1-2+ Nuclear sclerosis, 1-2+ Cortical cataract   Anterior Vitreous mild syneresis mild syneresis, blood stained vitreous condensations clearing and settling inferiorly         Fundus Exam       Right Left   Disc Pink and Sharp, fine vascular loops, ?early NVD Pink and Sharp, +fibrosis, +NVD   C/D Ratio 0.2 0.3   Macula Flat, Good foveal reflex, scattered MA / DBH, exudates and cystic changes, scattered focal laser changes Flat, Blunted foveal reflex, ERM with striae, scatterd MA / DBH, exudates and cystic changes, scattered focal laser changes   Vessels attenuated, tortuous, ?IRMA attenuated, Tortuous, +NV greatest nasal to disc   Periphery Attached, MA / DBH Attached, subhyaloid heme settling inferiorly, scattered MA / DBH, fibrotic NV nasal to disc            IMAGING AND PROCEDURES  Imaging and Procedures for 12/23/2023  OCT, Retina - OU - Both Eyes       Right Eye Quality was good. Central Foveal Thickness: 260. Progression has been stable. Findings include normal foveal contour, no SRF, intraretinal hyper-reflective material, intraretinal fluid, outer retinal atrophy, vitreomacular adhesion (Focal cystic changes and IRHM temporal macula ).   Left Eye Quality was good. Central Foveal Thickness: 296. Progression has been stable. Findings include no SRF, abnormal foveal contour, subretinal hyper-reflective material, epiretinal membrane, intraretinal fluid, outer retinal atrophy, preretinal fibrosis (+vitreous opacities -- stably improved; ERM with pucker, persistent focal cystic changes and IRHM temporal macula).   Notes *Images captured and stored on drive  Diagnosis / Impression:  Mild DME OU OD: Focal cystic changes and IRHM temporal macula  OS: +vitreous opacities -- stably improved; ERM with pucker, persistent focal cystic changes and IRHM temporal macula  Clinical management:  See below  Abbreviations: NFP - Normal foveal profile. CME - cystoid macular edema. PED - pigment epithelial detachment. IRF - intraretinal fluid. SRF - subretinal fluid. EZ - ellipsoid zone. ERM - epiretinal membrane. ORA - outer retinal atrophy. ORT - outer retinal tubulation. SRHM - subretinal hyper-reflective material. IRHM - intraretinal hyper-reflective material      Intravitreal Injection, Pharmacologic Agent - OS - Left Eye       Time Out 12/23/2023. 9:32 AM. Confirmed correct patient, procedure, site, and patient consented.   Anesthesia No anesthesia was used. Anesthetic medications included Lidocaine  2%, Proparacaine 0.5%.   Procedure Preparation  included 5% betadine to ocular surface, eyelid speculum. A (32g) needle was used.   Injection: 1.25 mg Bevacizumab  1.25mg /0.80ml   Route: Intravitreal, Site: Left Eye   NDC:  [id number], Lot: [id number], Expiration date: 03/28/2024   Post-op Post injection exam found visual acuity of at least counting fingers. The patient tolerated the procedure well. There were no complications. The patient received written and verbal post procedure care education. Post injection medications were not given.           ASSESSMENT/PLAN:   ICD-10-CM   1. Proliferative diabetic retinopathy of left eye with macular edema associated with type 1 diabetes mellitus (HCC)  E10.3512 OCT, Retina - OU - Both Eyes    Intravitreal Injection, Pharmacologic Agent - OS - Left Eye    Bevacizumab  (AVASTIN ) SOLN 1.25 mg    2. 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 Current use of insulin  (HCC)  Z79.4     4. Vitreous hemorrhage of left eye (HCC)  H43.12     5. Essential hypertension  I10     6. Hypertensive retinopathy of both eyes  H35.033      1-4. Proliferative diabetic retinopathy, left eye        Severe NPDR, right eye  - s/p IVA OS #1 (05.16.25)  - previously managed by [facility], but lost to retina f/u since 09.26.2022  - h/o focal laser OU by [facility] -- OD 09.03.2020, 09.06.2022; OS 09.14.22  - pt referred back here by Dr. Vivian  - pt reports 1 yr history of waxing/waning floaters OS, floaters more persistent OS last several months - exam shows scattered MA/DBH OU; +NV w/ fibrosis OS - OCT shows OD: Focal cystic changes and IRHM temporal macula; OS: +vitreous opacities -- stably improved; ERM with pucker, persistent focal cystic changes and IRHM temporal macula at 5+ weeks - BCVA OD 20/20; OS 20/25 from 20/30 - recommend IVA OS #2 today, 06.25.25 for PDR and VH - pt wishes to proceed but injection  - RBA of procedure discussed, questions answered - IVA informed consent obtained and signed, 05.16.25 - see procedure note - f/u in 4 wks -- DFE/OCT, FA transit OS, possible injection  5,6.  Hypertensive retinopathy OU  - BP today (05.05.25) 166/108 - discussed importance of tight BP control - monitor  7. Nuclear sclerosis OU - The symptoms of cataract, surgical options, and treatments and risks were discussed with patient. - discussed diagnosis and progression - monitor    Ophthalmic Meds Ordered this visit:  Meds ordered this encounter  Medications   Bevacizumab  (AVASTIN ) SOLN 1.25 mg     Return in about 4 weeks (around 01/20/2024) for f/u PDR OU, DFE, OCT.  There are no Patient Instructions on file for this visit.  This document serves as a record of services personally performed by Redell JUDITHANN Hans, MD, PhD. It was created on their behalf by Alan PARAS. Delores, OA an ophthalmic technician. The creation of this record is the provider's dictation and/or activities during the visit.    Electronically signed by: Alan PARAS. Delores, OA 12/23/23 11:04 PM   Redell JUDITHANN Hans, M.D., Ph.D. Diseases & Surgery of the Retina and Vitreous [facility] 12/23/2023   I have reviewed the above documentation for accuracy and completeness, and I agree with the above. Redell JUDITHANN Hans, M.D., Ph.D. 12/23/23 11:05 PM    Abbreviations: CHRISTELLA myopia (  nearsighted); A astigmatism; H hyperopia (farsighted); P presbyopia; Mrx spectacle prescription;  CTL contact lenses; OD right eye; OS left eye; OU both eyes  XT exotropia; ET esotropia; PEK punctate epithelial keratitis; PEE punctate epithelial erosions; DES dry eye syndrome; MGD meibomian gland dysfunction; ATs artificial tears; PFAT's preservative free artificial tears; NSC nuclear sclerotic cataract; PSC posterior subcapsular cataract; ERM epi-retinal membrane; PVD posterior vitreous detachment; RD retinal detachment; DM diabetes mellitus; DR diabetic retinopathy; NPDR non-proliferative diabetic retinopathy; PDR proliferative diabetic retinopathy; CSME clinically significant macular edema; DME diabetic macular edema; dbh dot blot  hemorrhages; CWS cotton wool spot; POAG primary open angle glaucoma; C/D cup-to-disc ratio; HVF humphrey visual field; GVF goldmann visual field; OCT optical coherence tomography; IOP intraocular pressure; BRVO Branch retinal vein occlusion; CRVO central retinal vein occlusion; CRAO central retinal artery occlusion; BRAO branch retinal artery occlusion; RT retinal tear; SB scleral buckle; PPV pars plana vitrectomy; VH Vitreous hemorrhage; PRP panretinal laser photocoagulation; IVK intravitreal kenalog; VMT vitreomacular traction; MH Macular hole;  NVD neovascularization of the disc; NVE neovascularization elsewhere; AREDS age related eye disease study; ARMD age related macular degeneration; POAG primary open angle glaucoma; EBMD epithelial/anterior basement membrane dystrophy; ACIOL anterior chamber intraocular lens; IOL intraocular lens; PCIOL posterior chamber intraocular lens; Phaco/IOL phacoemulsification with intraocular lens placement; PRK photorefractive keratectomy; LASIK laser assisted in situ keratomileusis; HTN hypertension; DM diabetes mellitus; COPD chronic obstructive pulmonary disease

## 2023-12-23 ENCOUNTER — Ambulatory Visit (INDEPENDENT_AMBULATORY_CARE_PROVIDER_SITE_OTHER): Admitting: Ophthalmology

## 2023-12-23 ENCOUNTER — Encounter (INDEPENDENT_AMBULATORY_CARE_PROVIDER_SITE_OTHER): Payer: Self-pay | Admitting: Ophthalmology

## 2023-12-23 DIAGNOSIS — Z794 Long term (current) use of insulin: Secondary | ICD-10-CM | POA: Diagnosis not present

## 2023-12-23 DIAGNOSIS — E103512 Type 1 diabetes mellitus with proliferative diabetic retinopathy with macular edema, left eye: Secondary | ICD-10-CM

## 2023-12-23 DIAGNOSIS — I10 Essential (primary) hypertension: Secondary | ICD-10-CM

## 2023-12-23 DIAGNOSIS — H35033 Hypertensive retinopathy, bilateral: Secondary | ICD-10-CM

## 2023-12-23 DIAGNOSIS — E103411 Type 1 diabetes mellitus with severe nonproliferative diabetic retinopathy with macular edema, right eye: Secondary | ICD-10-CM | POA: Diagnosis not present

## 2023-12-23 DIAGNOSIS — H4312 Vitreous hemorrhage, left eye: Secondary | ICD-10-CM | POA: Diagnosis not present

## 2023-12-23 MED ORDER — BEVACIZUMAB CHEMO INJECTION 1.25MG/0.05ML SYRINGE FOR KALEIDOSCOPE
1.2500 mg | INTRAVITREAL | Status: AC | PRN
  Administered 2023-12-23: 1.25 mg via INTRAVITREAL

## 2024-01-05 ENCOUNTER — Other Ambulatory Visit (HOSPITAL_COMMUNITY): Payer: Self-pay

## 2024-01-07 ENCOUNTER — Encounter: Attending: Internal Medicine | Admitting: Dietician

## 2024-01-07 ENCOUNTER — Ambulatory Visit: Admitting: Orthopedic Surgery

## 2024-01-07 DIAGNOSIS — E1065 Type 1 diabetes mellitus with hyperglycemia: Secondary | ICD-10-CM | POA: Diagnosis present

## 2024-01-07 DIAGNOSIS — L97511 Non-pressure chronic ulcer of other part of right foot limited to breakdown of skin: Secondary | ICD-10-CM | POA: Diagnosis not present

## 2024-01-07 NOTE — Progress Notes (Signed)
 Diabetes Self-Management Education  Visit Type: First/Initial  Appt. Start Time: 1035 (late) Appt. End Time: 1120  01/10/2024  [name], identified by name and date of birth, is a 48 y.o. female with a diagnosis of Diabetes: Type 1.   ASSESSMENT Patient is here today with her husband. She is now interested in using an insulin  pump to manage her blood glucose.   Showed patient all available insulin  pump options.  Patient states that she would prefer a tubeless system and will contact the [employer] company.  I will send patient's name to the rep as well. Patient and husband are interested in the Type 1 Diabetes support group.  I have added her to the list. Patient is not taking the Tresiba consistently as she should.  She will skip this at night if her blood glucose is in the normal range.  Discussed her types of insulin  and action times.  Discussed how we look at dosing [place] and that she should take this consistently each night.  Discussed that she should discuss the dose with her MD if her fasting blood glucose is <80 or >130 consistently.  Discussed dosing of Lyumjev  as well.  Discussed that this should not be given more than q 3 hours.  Discussed dosing for a large meal (higher carb) vs. Small meal (lower carb).  Discussed that she should give a meal dose plus a correction dose. Patient states that she is currently being treated for an injection in her right foot and left toe  Referral:  Type 1 Diabetes - 2 admits with DKA within the past 3 months and needs pre-pump education (ISF/carb counting)  Type 1 Diabetes (2008), HLD, DKA, proteinuria, fecal incontinence, HLD, CKD, AKI, GERD, anemia Medications include:  Tresiba - 8-10 units q HS (patient's adjusts this),  Lyumjev  4 units before meals (about 4 times per day). Labs noted to include A1C 11.4% 10/12/2023 and 01/15/2023, vitamin B-12 380 4/152025 Sleep- poor due to heat exhaustion  CGM:  Libre 3   130 lbs 01/07/2024 lost  weight with increased blood glucose and increased working outside. 138 lbs 12/17/2023  Patient lives with her husband and 2 of her 3 children. She works as an Brewing technologist at a pool.   Diabetes Self-Management Education - 01/10/24 1513       Visit Information   Visit Type First/Initial      Initial Visit   Diabetes Type Type 1    Date Diagnosed 2008    Are you currently following a meal plan? No    Are you taking your medications as prescribed? No      Psychosocial Assessment   What is the hardest part about your diabetes right now, causing you the most concern, or is the most worrisome to you about your diabetes?   Making healty food and beverage choices    Self-care barriers None    Self-management support Doctor's office;Family;CDE visits    Other persons present Patient;Spouse/SO    Patient Concerns Nutrition/Meal planning;Medication;Healthy Lifestyle;Problem Solving;Glycemic Control    Special Needs None    Preferred Learning Style No preference indicated    Learning Readiness Ready    How often do you need to have someone help you when you read instructions, pamphlets, or other written materials from your doctor or pharmacy? 1 - Never      Pre-Education Assessment   Patient understands the diabetes disease and treatment process. Needs Review    Patient understands incorporating  nutritional management into lifestyle. Needs Review    Patient undertands incorporating physical activity into lifestyle. Needs Review    Patient understands using medications safely. Needs Review    Patient understands monitoring blood glucose, interpreting and using results Needs Review    Patient understands prevention, detection, and treatment of acute complications. Needs Review    Patient understands prevention, detection, and treatment of chronic complications. Needs Review    Patient understands how to develop strategies to address psychosocial issues. Needs Review     Patient understands how to develop strategies to promote health/change behavior. Needs Review      Complications   Last HgB A1C per patient/outside source 11.4 %    How often do you check your blood sugar? > 4 times/day      Dietary Intake   Breakfast none today  except tomatoes, mayo, pickles as her omelet was not prepared well    Snack (morning) chips    Lunch cucumber, beef jerkey    Health and safety inspector (meat loaf, mashed potatoes and gravy),    Snack (evening) pizza, muffin, fruit, itialian ice    Beverage(s) water      Activity / Exercise   Activity / Exercise Type ADL's   work related     Patient Education   Previous Diabetes Education Yes   hospital the last time   Disease Pathophysiology Definition of diabetes, type 1 and 2, and the diagnosis of diabetes;Explored patient's options for treatment of their diabetes;Other (comment)   pump education   Healthy Eating Carbohydrate counting    Being Active Role of exercise on diabetes management, blood pressure control and cardiac health.    Medications Taught/reviewed insulin /injectables, injection, site rotation, insulin /injectables storage and needle disposal.    Monitoring Identified appropriate SMBG and/or A1C goals.;Daily foot exams;Yearly dilated eye exam    Acute complications Taught prevention, symptoms, and  treatment of hypoglycemia - the 15 rule.;Discussed and identified patients' prevention, symptoms, and treatment of hyperglycemia.    Chronic complications Applicable immunizations;Relationship between chronic complications and blood glucose control    Diabetes Stress and Support Travel strategies    Lifestyle and Health Coping Lifestyle issues that need to be addressed for better diabetes care      Post-Education Assessment   Patient understands the diabetes disease and treatment process. Comprehends key points    Patient understands incorporating nutritional management into lifestyle. Needs Review    Patient  undertands incorporating physical activity into lifestyle. Comprehends key points    Patient understands using medications safely. Comphrehends key points    Patient understands monitoring blood glucose, interpreting and using results Comprehends key points    Patient understands prevention, detection, and treatment of acute complications. Comprehends key points    Patient understands prevention, detection, and treatment of chronic complications. Comprehends key points    Patient understands how to develop strategies to address psychosocial issues. Needs Review    Patient understands how to develop strategies to promote health/change behavior. Needs Review      Outcomes   Expected Outcomes Demonstrated interest in learning but significant barriers to change    Future DMSE PRN    Program Status Not Completed          Individualized Plan for Diabetes Self-Management Training:   Learning Objective:  Patient will have a greater understanding of diabetes self-management. Patient education plan is to attend individual and/or group sessions per assessed needs and concerns.   Plan:   There are no Patient Instructions on file for  this visit.  Expected Outcomes:  Demonstrated interest in learning but significant barriers to change  Education material provided: ADA - How to Thrive: A Guide for Your Journey with Diabetes, Food label handouts, Meal plan card, Snack sheet, and Diabetes Resources  If problems or questions, patient to contact team via:  Phone  Future DSME appointment: PRN

## 2024-01-10 ENCOUNTER — Encounter: Payer: Self-pay | Admitting: Dietician

## 2024-01-14 ENCOUNTER — Ambulatory Visit: Admitting: Physician Assistant

## 2024-01-14 DIAGNOSIS — L97511 Non-pressure chronic ulcer of other part of right foot limited to breakdown of skin: Secondary | ICD-10-CM

## 2024-01-14 DIAGNOSIS — E1042 Type 1 diabetes mellitus with diabetic polyneuropathy: Secondary | ICD-10-CM

## 2024-01-14 DIAGNOSIS — L97529 Non-pressure chronic ulcer of other part of left foot with unspecified severity: Secondary | ICD-10-CM

## 2024-01-15 ENCOUNTER — Encounter: Payer: Self-pay | Admitting: Physician Assistant

## 2024-01-15 NOTE — Progress Notes (Signed)
 Office Visit Note   Patient: Connie Vaughn           Date of Birth: 1975/09/19           MRN: [id number] Visit Date: 01/14/2024              Requested by: Dayna Motto, DO [address] [place],  [place] [address] PCP: Dayna Motto, DO  Chief Complaint  Patient presents with   Right Foot - Wound Check   Left Great Toe - Wound Check      HPI: 48 Y/O female with callus/ulcer Right GT cellulitis and Chronic callus left plantar GT.  MRI was reviewed and did not demonstrate osteomyelitis.  She is here today for f/u exam and debridement of callus.  She denies fever and chills.    Assessment & Plan: Visit Diagnoses:  1. Diabetic polyneuropathy associated with type 1 diabetes mellitus (HCC)   2. Ulcer of left foot, unspecified ulcer stage (HCC)   3. Non-pressure chronic ulcer of other part of right foot limited to breakdown of skin (HCC)     Plan: Shower with soap and water daily clean wounds with Vashe then cover with band aide.  Follow-Up Instructions: Return in about 4 weeks (around 02/11/2024).   Ortho Exam  Patient is alert, oriented, no adenopathy, well-dressed, normal affect, normal respiratory effort. Left GT plantar callus debrided with 10 blade measuring 1 cm beefy red base of wound.  Right plantar Gt/MTH callus debrided with 10 blade.  Wound base beefy red measuring 0.5 cm.  No cellulitis or drainage.    Imaging: No results found. No images are attached to the encounter.  Labs: Lab Results  Component Value Date   HGBA1C 11.4 (H) 10/12/2023   HGBA1C 11.4 (H) 01/15/2023   HGBA1C 11.1 (H) 10/07/2020   ESRSEDRATE 44 (H) 03/06/2023   ESRSEDRATE 50 (H) 01/16/2023   CRP 0.7 03/06/2023   CRP 0.8 01/16/2023   LABURIC 4.2 01/16/2023   REPTSTATUS 12/21/2023 FINAL 12/16/2023   GRAMSTAIN  03/09/2021    ABUNDANT WBC PRESENT, PREDOMINANTLY PMN RARE GRAM POSITIVE COCCI    CULT  12/16/2023    NO GROWTH 5 DAYS Performed at [facility], [address]  [place], [place] [address]    [facility] STAPHYLOCOCCUS AUREUS 03/09/2021     Lab Results  Component Value Date   ALBUMIN 3.3 (L) 12/16/2023   ALBUMIN 3.8 12/16/2023   ALBUMIN 3.1 (L) 10/12/2023    Lab Results  Component Value Date   MG 1.9 10/15/2023   MG 1.9 10/14/2023   MG 2.0 10/12/2023   No results found for: VD25OH  No results found for: PREALBUMIN    Latest Ref Rng & Units 12/17/2023    5:19 AM 12/16/2023    5:37 PM 12/16/2023    3:43 PM  CBC EXTENDED  WBC 4.0 - 10.5 K/uL 7.2   11.0   RBC 3.87 - 5.11 MIL/uL 3.59   4.07   Hemoglobin 12.0 - 15.0 g/dL 89.5  89.0  88.1   HCT 36.0 - 46.0 % 31.9  32.0  36.2   Platelets 150 - 400 K/uL 329   376   NEUT# 1.7 - 7.7 K/uL 3.3   8.8   Lymph# 0.7 - 4.0 K/uL 3.1   1.5      There is no height or weight on file to calculate BMI.  Orders:  No orders of the defined types were placed in this encounter.  No orders of  the defined types were placed in this encounter.    Procedures: No procedures performed  Clinical Data: No additional findings.  ROS:  All other systems negative, except as noted in the HPI. Review of Systems  Objective: Vital Signs: There were no vitals taken for this visit.  Specialty Comments:  No specialty comments available.  PMFS History: Patient Active Problem List   Diagnosis Date Noted   Essential hypertension 12/17/2023   Cellulitis of buttock, left 12/17/2023   ARF (acute renal failure) (HCC) 12/17/2023   Hyponatremia 12/17/2023   Non-pressure chronic ulcer of other part of right foot limited to breakdown of skin (HCC) 12/17/2023   Osteomyelitis (HCC) 12/16/2023   DKA, type 1, not at goal [facility]) 10/12/2023   Abdominal pain 10/12/2023   Dehydration 10/12/2023   Hypokalemia 06/29/2023   DKA (diabetic ketoacidosis) (HCC) 06/28/2023   Chronic allergic conjunctivitis 06/28/2023   Vasomotor rhinitis 06/28/2023   Tachycardia 06/28/2023   Proteinuria 06/28/2023   Elevated lipase 06/28/2023    Mild protein malnutrition (HCC) 06/28/2023   Anemia 06/28/2023   Prolonged QT interval 06/28/2023   Hypophosphatemia 06/28/2023   Lactic acidosis 06/28/2023   Intractable diarrhea 06/27/2023   AKI (acute kidney injury) (HCC) 03/06/2023   Osteomyelitis of great toe of left foot (HCC) 01/15/2023   Cellulitis in diabetic foot (HCC) 01/15/2023   Hyperlipidemia 10/06/2020   Diabetic polyneuropathy associated with type 1 diabetes mellitus (HCC) 08/18/2018   Chronic post-traumatic stress disorder (PTSD) 05/26/2017   Pneumococcal vaccination declined 11/15/2014   Type 1 diabetes mellitus with hyperglycemia (HCC) 07/23/2010   Past Medical History:  Diagnosis Date   ADHD (attention deficit hyperactivity disorder)    AKI (acute kidney injury) (HCC) 10/06/2020   Anemia    Blood transfusion without reported diagnosis    Constipation    Diabetes mellitus type 1, uncontrolled    DKA (diabetic ketoacidosis) (HCC) 10/06/2020   GERD (gastroesophageal reflux disease)    Hyperlipidemia    Incontinence, feces     Family History  Problem Relation Age of Onset   Heart disease Mother    Congestive Heart Failure Mother    Congestive Heart Failure Sister    Diabetes Maternal Grandmother    Colon polyps Neg Hx    Crohn's disease Neg Hx    Esophageal cancer Neg Hx    Rectal cancer Neg Hx    Stomach cancer Neg Hx     Past Surgical History:  Procedure Laterality Date   ANAL RECTAL MANOMETRY N/A 07/29/2023   Procedure: ANO RECTAL MANOMETRY;  Surgeon: Nandigam, Kavitha V, MD;  Location: [facility];  Service: Gastroenterology;  Laterality: N/A;   I & D EXTREMITY Right 03/09/2021   Procedure: IRRIGATION AND DEBRIDEMENT RIGHT RING FINGER;  Surgeon: Murrell Drivers, MD;  Location: [facility];  Service: Orthopedics;  Laterality: Right;   TUBAL LIGATION     Social History   Occupational History   Not on file  Tobacco Use   Smoking status: Some Days    Current packs/day: 0.25    Average packs/day: 0.3  packs/day for 2.0 years (0.5 ttl pk-yrs)    Types: Cigarettes   Smokeless tobacco: Never   Tobacco comments:    also uses Black and Mild  Vaping Use   Vaping status: Never Used  Substance and Sexual Activity   Alcohol use: Never   Drug use: Yes    Comment: CBD edibles   Sexual activity: Yes    Birth control/protection: Surgical    Comment: Tubal Ligation

## 2024-01-17 ENCOUNTER — Encounter: Payer: Self-pay | Admitting: Orthopedic Surgery

## 2024-01-17 NOTE — Progress Notes (Signed)
 Office Visit Note   Patient: Connie Vaughn           Date of Birth: 1975/12/05           MRN: [id number] Visit Date: 01/07/2024              Requested by: Dayna Motto, DO [address] [place],  [place] [address] PCP: Dayna Motto, DO  Chief Complaint  Patient presents with   Right Foot - Hospitalization Follow-up   Left Great Toe - Hospitalization Follow-up      HPI: Patient is a 48 year old woman who is seen for initial evaluation of ulceration right great toe and callus beneath the left great toe.  Patient is undergoing an MRI scan.  Patient has a history of type 1 diabetes.  Assessment & Plan: Visit Diagnoses:  1. 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  Discussed recommendation to use a new balance walking sneaker and a sole orthotic.  Recommended using an insulin  pump.  Follow-Up Instructions: No follow-ups on file.   Ortho Exam  Patient is alert, oriented, no adenopathy, well-dressed, normal affect, normal respiratory effort. Examination patient has palpable pulses bilaterally.  Most recent hemoglobin A1c 11.4.  Patient has a Wagner grade 1 ulcer right great toe MTP joint.  After informed consent a 10 blade knife was used to debride the skin and soft tissue back to healthy viable granulation tissue.  There is no tunneling the ulcer does not probe to bone.  The ulcer is 2 cm after debridement in diameter.  Patient has a callus beneath the left great toe and this was also pared.  Patient has good dorsiflexion of the ankle of 20 degrees great toe dorsiflexion of 45 degrees.  Review of the MRI scan does not show infection of the great toe MTP joint does show edema in the tibial sesamoid.    Imaging: No results found. No images are attached to the encounter.  Labs: Lab Results  Component Value Date   HGBA1C 11.4 (H) 10/12/2023    HGBA1C 11.4 (H) 01/15/2023   HGBA1C 11.1 (H) 10/07/2020   ESRSEDRATE 44 (H) 03/06/2023   ESRSEDRATE 50 (H) 01/16/2023   CRP 0.7 03/06/2023   CRP 0.8 01/16/2023   LABURIC 4.2 01/16/2023   REPTSTATUS 12/21/2023 FINAL 12/16/2023   GRAMSTAIN  03/09/2021    ABUNDANT WBC PRESENT, PREDOMINANTLY PMN RARE GRAM POSITIVE COCCI    CULT  12/16/2023    NO GROWTH 5 DAYS Performed at [facility], [address] [place], [place] [address]    [facility] STAPHYLOCOCCUS AUREUS 03/09/2021     Lab Results  Component Value Date   ALBUMIN 3.3 (L) 12/16/2023   ALBUMIN 3.8 12/16/2023   ALBUMIN 3.1 (L) 10/12/2023    Lab Results  Component Value Date   MG 1.9 10/15/2023   MG 1.9 10/14/2023   MG 2.0 10/12/2023   No results found for: VD25OH  No results found for: PREALBUMIN    Latest Ref Rng & Units 12/17/2023    5:19 AM 12/16/2023    5:37 PM 12/16/2023    3:43 PM  CBC EXTENDED  WBC 4.0 - 10.5 K/uL 7.2   11.0   RBC 3.87 - 5.11 MIL/uL 3.59   4.07   Hemoglobin 12.0 - 15.0 g/dL 89.5  89.0  88.1   HCT 36.0 -  46.0 % 31.9  32.0  36.2   Platelets 150 - 400 K/uL 329   376   NEUT# 1.7 - 7.7 K/uL 3.3   8.8   Lymph# 0.7 - 4.0 K/uL 3.1   1.5      There is no height or weight on file to calculate BMI.  Orders:  No orders of the defined types were placed in this encounter.  No orders of the defined types were placed in this encounter.    Procedures: No procedures performed  Clinical Data: No additional findings.  ROS:  All other systems negative, except as noted in the HPI. Review of Systems  Objective: Vital Signs: There were no vitals taken for this visit.  Specialty Comments:  No specialty comments available.  PMFS History: Patient Active Problem List   Diagnosis Date Noted   Essential hypertension 12/17/2023   Cellulitis of buttock, left 12/17/2023   ARF (acute renal failure) (HCC) 12/17/2023   Hyponatremia 12/17/2023   Non-pressure chronic ulcer of other part of  right foot limited to breakdown of skin (HCC) 12/17/2023   Osteomyelitis (HCC) 12/16/2023   DKA, type 1, not at goal [facility]) 10/12/2023   Abdominal pain 10/12/2023   Dehydration 10/12/2023   Hypokalemia 06/29/2023   DKA (diabetic ketoacidosis) (HCC) 06/28/2023   Chronic allergic conjunctivitis 06/28/2023   Vasomotor rhinitis 06/28/2023   Tachycardia 06/28/2023   Proteinuria 06/28/2023   Elevated lipase 06/28/2023   Mild protein malnutrition (HCC) 06/28/2023   Anemia 06/28/2023   Prolonged QT interval 06/28/2023   Hypophosphatemia 06/28/2023   Lactic acidosis 06/28/2023   Intractable diarrhea 06/27/2023   AKI (acute kidney injury) (HCC) 03/06/2023   Osteomyelitis of great toe of left foot (HCC) 01/15/2023   Cellulitis in diabetic foot (HCC) 01/15/2023   Hyperlipidemia 10/06/2020   Diabetic polyneuropathy associated with type 1 diabetes mellitus (HCC) 08/18/2018   Chronic post-traumatic stress disorder (PTSD) 05/26/2017   Pneumococcal vaccination declined 11/15/2014   Type 1 diabetes mellitus with hyperglycemia (HCC) 07/23/2010   Past Medical History:  Diagnosis Date   ADHD (attention deficit hyperactivity disorder)    AKI (acute kidney injury) (HCC) 10/06/2020   Anemia    Blood transfusion without reported diagnosis    Constipation    Diabetes mellitus type 1, uncontrolled    DKA (diabetic ketoacidosis) (HCC) 10/06/2020   GERD (gastroesophageal reflux disease)    Hyperlipidemia    Incontinence, feces     Family History  Problem Relation Age of Onset   Heart disease Mother    Congestive Heart Failure Mother    Congestive Heart Failure Sister    Diabetes Maternal Grandmother    Colon polyps Neg Hx    Crohn's disease Neg Hx    Esophageal cancer Neg Hx    Rectal cancer Neg Hx    Stomach cancer Neg Hx     Past Surgical History:  Procedure Laterality Date   ANAL RECTAL MANOMETRY N/A 07/29/2023   Procedure: ANO RECTAL MANOMETRY;  Surgeon: Nandigam, Kavitha V, MD;   Location: [facility];  Service: Gastroenterology;  Laterality: N/A;   I & D EXTREMITY Right 03/09/2021   Procedure: IRRIGATION AND DEBRIDEMENT RIGHT RING FINGER;  Surgeon: Murrell Drivers, MD;  Location: [facility];  Service: Orthopedics;  Laterality: Right;   TUBAL LIGATION     Social History   Occupational History   Not on file  Tobacco Use   Smoking status: Some Days    Current packs/day: 0.25    Average packs/day: 0.3 packs/day for  2.0 years (0.5 ttl pk-yrs)    Types: Cigarettes   Smokeless tobacco: Never   Tobacco comments:    also uses Black and Mild  Vaping Use   Vaping status: Never Used  Substance and Sexual Activity   Alcohol use: Never   Drug use: Yes    Comment: CBD edibles   Sexual activity: Yes    Birth control/protection: Surgical    Comment: Tubal Ligation

## 2024-01-19 NOTE — Progress Notes (Shared)
 [facility] - Clinic Note  01/27/2024   CHIEF COMPLAINT Patient presents for No chief complaint on file.  HISTORY OF PRESENT ILLNESS: Connie Vaughn is a 48 y.o. female who presents to the clinic today for:   Pt states she was in the hospital for her foot a few weeks ago, they thought she had an infection in her bone at first, but she doesn't, she states her floaters have gotten better    Referring physician: Frazier, [place], OD [address] [place],  [place] [address]  HISTORICAL INFORMATION:  Selected notes from the MEDICAL RECORD NUMBER Referred by Dr. Vivian for concern of NPDR  LEE:  Ocular Hx-  Previously followed w/ [facility] -- last visit 09.26.2022 history of focal laser OU w/ [facility] -- OD 09.03.2020, 09.06.2022; OS 09.14.22  PMH-   CURRENT MEDICATIONS: No current outpatient medications on file. (Ophthalmic Drugs)   No current facility-administered medications for this visit. (Ophthalmic Drugs)   Current Outpatient Medications (Other)  Medication Sig   b complex vitamins capsule Take 1 capsule by mouth daily.   Blood Glucose Monitoring Suppl (BLOOD GLUCOSE MONITOR SYSTEM) w/Device KIT Use to check blood sugar three times daily   Cholecalciferol (VITAMIN D3) 1.25 MG (50000 UT) CAPS Take 1 capsule by mouth once a week.   docusate sodium  (COLACE) 100 MG capsule Take 1 capsule (100 mg total) by mouth 2 (two) times daily as needed for mild constipation.   EPINEPHrine 0.3 mg/0.3 mL IJ SOAJ injection Inject 0.3 mg into the muscle as needed for anaphylaxis.   ferrous sulfate  325 (65 FE) MG tablet Take 1 tablet (325 mg total) by mouth 2 (two) times daily with a meal.   Glucose Blood (BLOOD GLUCOSE TEST STRIPS) STRP Use to check blood sugar three times daily   glucose blood test strip Use as instructed   insulin  glargine (LANTUS ) 100 UNIT/ML Solostar Pen Inject 25 Units into the skin daily.   insulin  lispro (HUMALOG ) 100 UNIT/ML KwikPen Inject 3 Units  into the skin 3 (three) times daily with meals. If eating and Blood Glucose (BG) 80 or higher inject 5 units for meal coverage and add correction dose per scale. If not eating, correction dose only. BG <150= 0 unit; BG 150-200= 1 unit; BG 201-250= 3 unit; BG 251-300= 5 unit; BG 301-350= 7 unit; BG 351-400= 9 unit; BG >400= 11 unit and Call Primary Care.   Insulin  Pen Needle (B-D UF III MINI PEN NEEDLES) 31G X 5 MM MISC Use as instructed (Patient taking differently: 1 each by Other route in the morning, at noon, and at bedtime. Use as instructed)   Insulin  Pen Needle (PEN NEEDLES) 31G X 5 MM MISC Use to inject insulin  three times daily.   Lancet Device MISC 1 each by Does not apply route 3 (three) times daily. May dispense any manufacturer covered by patient's insurance.   Lancets MISC Use to check blood sugar 3 times daily   lisinopril  (ZESTRIL ) 20 MG tablet Take 1 tablet (20 mg total) by mouth daily.   pantoprazole  (PROTONIX ) 40 MG tablet Take 1 tablet (40 mg total) by mouth daily before breakfast.   traMADol (ULTRAM) 50 MG tablet Take 50 mg by mouth daily as needed.   TRESIBA FLEXTOUCH 100 UNIT/ML FlexTouch Pen Inject 10-12 Units into the skin at bedtime.   No current facility-administered medications for this visit. (Other)   REVIEW OF SYSTEMS:     ALLERGIES Allergies  Allergen Reactions  Sulfa Antibiotics Hives   PAST MEDICAL HISTORY Past Medical History:  Diagnosis Date   ADHD (attention deficit hyperactivity disorder)    AKI (acute kidney injury) (HCC) 10/06/2020   Anemia    Blood transfusion without reported diagnosis    Constipation    Diabetes mellitus type 1, uncontrolled    DKA (diabetic ketoacidosis) (HCC) 10/06/2020   GERD (gastroesophageal reflux disease)    Hyperlipidemia    Incontinence, feces    Past Surgical History:  Procedure Laterality Date   ANAL RECTAL MANOMETRY N/A 07/29/2023   Procedure: ANO RECTAL MANOMETRY;  Surgeon: Shila Gustav GAILS, MD;   Location: [facility];  Service: Gastroenterology;  Laterality: N/A;   I & D EXTREMITY Right 03/09/2021   Procedure: IRRIGATION AND DEBRIDEMENT RIGHT RING FINGER;  Surgeon: Murrell Drivers, MD;  Location: [facility];  Service: Orthopedics;  Laterality: Right;   TUBAL LIGATION     FAMILY HISTORY Family History  Problem Relation Age of Onset   Heart disease Mother    Congestive Heart Failure Mother    Congestive Heart Failure Sister    Diabetes Maternal Grandmother    Colon polyps Neg Hx    Crohn's disease Neg Hx    Esophageal cancer Neg Hx    Rectal cancer Neg Hx    Stomach cancer Neg Hx    SOCIAL HISTORY Social History   Tobacco Use   Smoking status: Some Days    Current packs/day: 0.25    Average packs/day: 0.3 packs/day for 2.0 years (0.5 ttl pk-yrs)    Types: Cigarettes   Smokeless tobacco: Never   Tobacco comments:    also uses Black and Mild  Vaping Use   Vaping status: Never Used  Substance Use Topics   Alcohol use: Never   Drug use: Yes    Comment: CBD edibles       OPHTHALMIC EXAM:  Not recorded    IMAGING AND PROCEDURES  Imaging and Procedures for 01/27/2024         ASSESSMENT/PLAN: No diagnosis found.  1-4. Proliferative diabetic retinopathy, left eye        Severe NPDR, right eye  - s/p IVA OS #1 (05.16.25), #2 (06.25.25)  - previously managed by [facility], but lost to retina f/u since 09.26.2022  - h/o focal laser OU by [facility] -- OD 09.03.2020, 09.06.2022; OS 09.14.22  - pt referred back here by Dr. Vivian  - pt reports 1 yr history of waxing/waning floaters OS, floaters more persistent OS last several months - exam shows scattered MA/DBH OU; +NV w/ fibrosis OS - OCT shows OD: Focal cystic changes and IRHM temporal macula; OS: +vitreous opacities -- stably improved; ERM with pucker, persistent focal cystic changes and IRHM temporal macula at 5+ weeks - BCVA OD 20/20; OS 20/25 from 20/30 - recommend IVA OS #3 today, 07.30.25 for PDR and VH - pt wishes to  proceed but injection  - RBA of procedure discussed, questions answered - IVA informed consent obtained and signed, 05.16.25 - see procedure note - f/u in 4 wks -- DFE/OCT, FA transit OS, possible injection  5,6. Hypertensive retinopathy OU  - BP today (05.05.25) 166/108 - discussed importance of tight BP control - monitor  7. Nuclear sclerosis OU - The symptoms of cataract, surgical options, and treatments and risks were discussed with patient. - discussed diagnosis and progression - monitor    Ophthalmic Meds Ordered this visit:  No orders of the defined types were placed in this encounter.    No  follow-ups on file.  There are no Patient Instructions on file for this visit.  This document serves as a record of services personally performed by Redell JUDITHANN Hans, MD, PhD. It was created on their behalf by Alan PARAS. Delores, OA an ophthalmic technician. The creation of this record is the provider's dictation and/or activities during the visit.    Electronically signed by: Alan PARAS. Delores BIHARI 01/19/24 12:23 PM   Redell JUDITHANN Hans, M.D., Ph.D. Diseases & Surgery of the Retina and Vitreous [facility] 12/23/2023    Abbreviations: M myopia (nearsighted); A astigmatism; H hyperopia (farsighted); P presbyopia; Mrx spectacle prescription;  CTL contact lenses; OD right eye; OS left eye; OU both eyes  XT exotropia; ET esotropia; PEK punctate epithelial keratitis; PEE punctate epithelial erosions; DES dry eye syndrome; MGD meibomian gland dysfunction; ATs artificial tears; PFAT's preservative free artificial tears; NSC nuclear sclerotic cataract; PSC posterior subcapsular cataract; ERM epi-retinal membrane; PVD posterior vitreous detachment; RD retinal detachment; DM diabetes mellitus; DR diabetic retinopathy; NPDR non-proliferative diabetic retinopathy; PDR proliferative diabetic retinopathy; CSME clinically significant macular edema; DME diabetic macular edema; dbh dot  blot hemorrhages; CWS cotton wool spot; POAG primary open angle glaucoma; C/D cup-to-disc ratio; HVF humphrey visual field; GVF goldmann visual field; OCT optical coherence tomography; IOP intraocular pressure; BRVO Branch retinal vein occlusion; CRVO central retinal vein occlusion; CRAO central retinal artery occlusion; BRAO branch retinal artery occlusion; RT retinal tear; SB scleral buckle; PPV pars plana vitrectomy; VH Vitreous hemorrhage; PRP panretinal laser photocoagulation; IVK intravitreal kenalog; VMT vitreomacular traction; MH Macular hole;  NVD neovascularization of the disc; NVE neovascularization elsewhere; AREDS age related eye disease study; ARMD age related macular degeneration; POAG primary open angle glaucoma; EBMD epithelial/anterior basement membrane dystrophy; ACIOL anterior chamber intraocular lens; IOL intraocular lens; PCIOL posterior chamber intraocular lens; Phaco/IOL phacoemulsification with intraocular lens placement; PRK photorefractive keratectomy; LASIK laser assisted in situ keratomileusis; HTN hypertension; DM diabetes mellitus; COPD chronic obstructive pulmonary disease

## 2024-01-27 ENCOUNTER — Encounter (INDEPENDENT_AMBULATORY_CARE_PROVIDER_SITE_OTHER): Admitting: Ophthalmology

## 2024-01-27 DIAGNOSIS — E103512 Type 1 diabetes mellitus with proliferative diabetic retinopathy with macular edema, left eye: Secondary | ICD-10-CM

## 2024-01-27 DIAGNOSIS — I10 Essential (primary) hypertension: Secondary | ICD-10-CM

## 2024-01-27 DIAGNOSIS — H2513 Age-related nuclear cataract, bilateral: Secondary | ICD-10-CM

## 2024-01-27 DIAGNOSIS — H4312 Vitreous hemorrhage, left eye: Secondary | ICD-10-CM

## 2024-01-27 DIAGNOSIS — E103411 Type 1 diabetes mellitus with severe nonproliferative diabetic retinopathy with macular edema, right eye: Secondary | ICD-10-CM

## 2024-01-27 DIAGNOSIS — Z794 Long term (current) use of insulin: Secondary | ICD-10-CM

## 2024-01-27 DIAGNOSIS — H35033 Hypertensive retinopathy, bilateral: Secondary | ICD-10-CM

## 2024-02-18 ENCOUNTER — Encounter: Payer: Self-pay | Admitting: Orthopedic Surgery

## 2024-02-18 ENCOUNTER — Ambulatory Visit: Admitting: Orthopedic Surgery

## 2024-02-18 DIAGNOSIS — E1042 Type 1 diabetes mellitus with diabetic polyneuropathy: Secondary | ICD-10-CM

## 2024-02-18 DIAGNOSIS — L97529 Non-pressure chronic ulcer of other part of left foot with unspecified severity: Secondary | ICD-10-CM | POA: Diagnosis not present

## 2024-02-18 DIAGNOSIS — R159 Full incontinence of feces: Secondary | ICD-10-CM

## 2024-02-18 DIAGNOSIS — L97511 Non-pressure chronic ulcer of other part of right foot limited to breakdown of skin: Secondary | ICD-10-CM | POA: Diagnosis not present

## 2024-02-18 NOTE — Progress Notes (Signed)
 Office Visit Note   Patient: Connie Vaughn           Date of Birth: 08-Feb-1976           MRN: [id number] Visit Date: 02/18/2024              Requested by: Dayna Motto, DO [address] [place],  [place] [address] PCP: Dayna Motto, DO  Chief Complaint  Patient presents with   Right Foot - Follow-up   Left Foot - Follow-up      HPI: Patient is a 48 year old woman who presents in follow-up for Wagner grade 1 ulcers right and left great toe.  Patient is currently wearing thin comfort shoes.  Assessment & Plan: Visit Diagnoses:  1. Diabetic polyneuropathy associated with type 1 diabetes mellitus (HCC)   2. Ulcer of left foot, unspecified ulcer stage (HCC)   3. Non-pressure chronic ulcer of other part of right foot limited to breakdown of skin (HCC)     Plan: Ulcer on the left foot is healed the callus was pared.  Ulcer on the right great toe is also healing well.  Continue current care.  Follow-Up Instructions: Return in about 4 weeks (around 03/17/2024).   Ortho Exam  Patient is alert, oriented, no adenopathy, well-dressed, normal affect, normal respiratory effort. Examination of the left foot the ulcer beneath the IP joint is almost completely healed.  The callus was pared and there is no open wounds no tunneling no cellulitis.  Examination the right foot the first metatarsal head ulcer is healing well.  After informed consent a 10 blade knife was used to debride the skin and soft tissue back to healthy viable granulation tissue.  There is no tunneling.  Silver nitrate was used for hemostasis and a Band-Aid was applied.    Imaging: No results found. No images are attached to the encounter.  Labs: Lab Results  Component Value Date   HGBA1C 11.4 (H) 10/12/2023   HGBA1C 11.4 (H) 01/15/2023   HGBA1C 11.1 (H) 10/07/2020   ESRSEDRATE 44 (H) 03/06/2023   ESRSEDRATE 50 (H) 01/16/2023   CRP 0.7 03/06/2023   CRP 0.8 01/16/2023   LABURIC 4.2 01/16/2023   REPTSTATUS  12/21/2023 FINAL 12/16/2023   GRAMSTAIN  03/09/2021    ABUNDANT WBC PRESENT, PREDOMINANTLY PMN RARE GRAM POSITIVE COCCI    CULT  12/16/2023    NO GROWTH 5 DAYS Performed at [facility], [address] [place], [place] [address]    [facility] STAPHYLOCOCCUS AUREUS 03/09/2021     Lab Results  Component Value Date   ALBUMIN 3.3 (L) 12/16/2023   ALBUMIN 3.8 12/16/2023   ALBUMIN 3.1 (L) 10/12/2023    Lab Results  Component Value Date   MG 1.9 10/15/2023   MG 1.9 10/14/2023   MG 2.0 10/12/2023   No results found for: VD25OH  No results found for: PREALBUMIN    Latest Ref Rng & Units 12/17/2023    5:19 AM 12/16/2023    5:37 PM 12/16/2023    3:43 PM  CBC EXTENDED  WBC 4.0 - 10.5 K/uL 7.2   11.0   RBC 3.87 - 5.11 MIL/uL 3.59   4.07   Hemoglobin 12.0 - 15.0 g/dL 89.5  89.0  88.1   HCT 36.0 - 46.0 % 31.9  32.0  36.2   Platelets 150 - 400 K/uL 329   376   NEUT# 1.7 - 7.7 K/uL 3.3   8.8   Lymph# 0.7 - 4.0 K/uL 3.1  1.5      There is no height or weight on file to calculate BMI.  Orders:  No orders of the defined types were placed in this encounter.  No orders of the defined types were placed in this encounter.    Procedures: No procedures performed  Clinical Data: No additional findings.  ROS:  All other systems negative, except as noted in the HPI. Review of Systems  Objective: Vital Signs: There were no vitals taken for this visit.  Specialty Comments:  No specialty comments available.  PMFS History: Patient Active Problem List   Diagnosis Date Noted   Essential hypertension 12/17/2023   Cellulitis of buttock, left 12/17/2023   ARF (acute renal failure) (HCC) 12/17/2023   Hyponatremia 12/17/2023   Non-pressure chronic ulcer of other part of right foot limited to breakdown of skin (HCC) 12/17/2023   Osteomyelitis (HCC) 12/16/2023   DKA, type 1, not at goal [facility]) 10/12/2023   Abdominal pain 10/12/2023   Dehydration 10/12/2023   Hypokalemia  06/29/2023   DKA (diabetic ketoacidosis) (HCC) 06/28/2023   Chronic allergic conjunctivitis 06/28/2023   Vasomotor rhinitis 06/28/2023   Tachycardia 06/28/2023   Proteinuria 06/28/2023   Elevated lipase 06/28/2023   Mild protein malnutrition (HCC) 06/28/2023   Anemia 06/28/2023   Prolonged QT interval 06/28/2023   Hypophosphatemia 06/28/2023   Lactic acidosis 06/28/2023   Intractable diarrhea 06/27/2023   AKI (acute kidney injury) (HCC) 03/06/2023   Osteomyelitis of great toe of left foot (HCC) 01/15/2023   Cellulitis in diabetic foot (HCC) 01/15/2023   Hyperlipidemia 10/06/2020   Diabetic polyneuropathy associated with type 1 diabetes mellitus (HCC) 08/18/2018   Chronic post-traumatic stress disorder (PTSD) 05/26/2017   Pneumococcal vaccination declined 11/15/2014   Type 1 diabetes mellitus with hyperglycemia (HCC) 07/23/2010   Past Medical History:  Diagnosis Date   ADHD (attention deficit hyperactivity disorder)    AKI (acute kidney injury) (HCC) 10/06/2020   Anemia    Blood transfusion without reported diagnosis    Constipation    Diabetes mellitus type 1, uncontrolled    DKA (diabetic ketoacidosis) (HCC) 10/06/2020   GERD (gastroesophageal reflux disease)    Hyperlipidemia    Incontinence, feces     Family History  Problem Relation Age of Onset   Heart disease Mother    Congestive Heart Failure Mother    Congestive Heart Failure Sister    Diabetes Maternal Grandmother    Colon polyps Neg Hx    Crohn's disease Neg Hx    Esophageal cancer Neg Hx    Rectal cancer Neg Hx    Stomach cancer Neg Hx     Past Surgical History:  Procedure Laterality Date   ANAL RECTAL MANOMETRY N/A 07/29/2023   Procedure: ANO RECTAL MANOMETRY;  Surgeon: Nandigam, Kavitha V, MD;  Location: [facility];  Service: Gastroenterology;  Laterality: N/A;   I & D EXTREMITY Right 03/09/2021   Procedure: IRRIGATION AND DEBRIDEMENT RIGHT RING FINGER;  Surgeon: Murrell Drivers, MD;  Location: [facility] OR;   Service: Orthopedics;  Laterality: Right;   TUBAL LIGATION     Social History   Occupational History   Not on file  Tobacco Use   Smoking status: Some Days    Current packs/day: 0.25    Average packs/day: 0.3 packs/day for 2.0 years (0.5 ttl pk-yrs)    Types: Cigarettes   Smokeless tobacco: Never   Tobacco comments:    also uses Black and Mild  Vaping Use   Vaping status: Never Used  Substance and  Sexual Activity   Alcohol use: Never   Drug use: Yes    Comment: CBD edibles   Sexual activity: Yes    Birth control/protection: Surgical    Comment: Tubal Ligation

## 2024-02-22 ENCOUNTER — Telehealth: Payer: Self-pay | Admitting: Dietician

## 2024-02-22 NOTE — Telephone Encounter (Signed)
 Called patient to follow up re:  Omnipod 5 order.  Call placed to Dr. Mickeal office regarding patient request to start an insulin  pump and need for an order for the Omnipod 5 as well as Dexcom CGM.  Both patient and her husband were not available.  Message left of patient's cell number.  Will also send her a my chart message.  Leita Constable, RD, LDN, CDCES, DipACLM

## 2024-03-17 ENCOUNTER — Ambulatory Visit: Admitting: Orthopedic Surgery

## 2024-03-22 ENCOUNTER — Ambulatory Visit: Admitting: Orthopedic Surgery

## 2024-03-22 DIAGNOSIS — E1042 Type 1 diabetes mellitus with diabetic polyneuropathy: Secondary | ICD-10-CM | POA: Diagnosis not present

## 2024-03-22 DIAGNOSIS — L97529 Non-pressure chronic ulcer of other part of left foot with unspecified severity: Secondary | ICD-10-CM

## 2024-03-22 DIAGNOSIS — L97511 Non-pressure chronic ulcer of other part of right foot limited to breakdown of skin: Secondary | ICD-10-CM | POA: Diagnosis not present

## 2024-03-27 ENCOUNTER — Encounter: Payer: Self-pay | Admitting: Orthopedic Surgery

## 2024-03-27 NOTE — Progress Notes (Signed)
 Office Visit Note   Patient: Connie Vaughn           Date of Birth: 1975-12-01           MRN: [id number] Visit Date: 03/22/2024              Requested by: Dayna Motto, DO [address] [place],  [place] [address] PCP: Dayna Motto, DO  Chief Complaint  Patient presents with   Right Foot - Wound Check   Left Foot - Wound Check      HPI: Discussed the use of AI scribe software for clinical note transcription with the patient, who gave verbal consent to proceed.  History of Present Illness Connie Vaughn is a 48 year old female with diabetes who presents with foot ulcers and tight Achilles tendons.  She is currently using a pump to manage her diabetes, which she started two weeks ago, and is hopeful it will help improve her condition.  She experiences tightness in her Achilles tendons, which has been a persistent issue. Her shoes are stiff but appropriate for her condition.  She manages her diabetes with a pump and a Libre system that works with the Dexcom 7. She notes feeling less fatigued since starting the pump, describing it as a 'tired that you're tired but not the kind where you're like you've been fighting your sugar all day tired.'     Assessment & Plan: Visit Diagnoses:  1. Diabetic polyneuropathy associated with type 1 diabetes mellitus (HCC)   2. Non-pressure chronic ulcer of other part of right foot limited to breakdown of skin (HCC)   3. Ulcer of left foot, unspecified ulcer stage (HCC)     Plan: Assessment and Plan Assessment & Plan Bilateral diabetic foot ulcers (Wagner grade 1) Bilateral diabetic foot ulcers, Wagner grade 1, beneath left great toe and right first metatarsal head. Ulcers debrided to healthy granulation tissue. No significant Achilles contracture. Dorsiflexion of ankles just past neutral, great toe dorsiflexion 75 degrees without hallux rigidus. - Debride ulcers to healthy granulation tissue. - Continue Achilles stretching  exercises. - Wear cushioned shoes. - Discussed surgical options: bone wedge resection, muscle lengthening; emphasized risks of poor wound healing due to diabetes. - Emphasized non-surgical management to prevent recurrence. - Follow up in six weeks.      Follow-Up Instructions: Return in about 4 weeks (around 04/19/2024).   Ortho Exam  Patient is alert, oriented, no adenopathy, well-dressed, normal affect, normal respiratory effort. Physical Exam CARDIOVASCULAR: Strong dorsalis pedis pulse bilaterally. EXTREMITIES: Wagner grade 1 ulcer beneath left great toe and right first metatarsal head. Right first metatarsal head ulcer 1 cm diameter after debridement. Left great toe ulcer 1 cm diameter after debridement.  After informed consent a 10 blade knife was used to debride the skin and soft tissue from bilateral great toe ulcers.  First metatarsal head ulcer on the right and the left great toe ulcer. MUSCULOSKELETAL: Dorsiflexion of both ankles just past neutral, no significant Achilles contracture. Dorsiflexion of great toe about 75 degrees without hallux rigidus.      Imaging: No results found. No images are attached to the encounter.  Labs: Lab Results  Component Value Date   HGBA1C 11.4 (H) 10/12/2023   HGBA1C 11.4 (H) 01/15/2023   HGBA1C 11.1 (H) 10/07/2020   ESRSEDRATE 44 (H) 03/06/2023   ESRSEDRATE 50 (H) 01/16/2023   CRP 0.7 03/06/2023   CRP 0.8 01/16/2023   LABURIC 4.2 01/16/2023   REPTSTATUS 12/21/2023 FINAL 12/16/2023  GRAMSTAIN  03/09/2021    ABUNDANT WBC PRESENT, PREDOMINANTLY PMN RARE GRAM POSITIVE COCCI    CULT  12/16/2023    NO GROWTH 5 DAYS Performed at [facility], [address], [place], [place] [address]    [facility] STAPHYLOCOCCUS AUREUS 03/09/2021     Lab Results  Component Value Date   ALBUMIN 3.3 (L) 12/16/2023   ALBUMIN 3.8 12/16/2023   ALBUMIN 3.1 (L) 10/12/2023    Lab Results  Component Value Date   MG 1.9 10/15/2023   MG 1.9  10/14/2023   MG 2.0 10/12/2023   No results found for: VD25OH  No results found for: PREALBUMIN    Latest Ref Rng & Units 12/17/2023    5:19 AM 12/16/2023    5:37 PM 12/16/2023    3:43 PM  CBC EXTENDED  WBC 4.0 - 10.5 K/uL 7.2   11.0   RBC 3.87 - 5.11 MIL/uL 3.59   4.07   Hemoglobin 12.0 - 15.0 g/dL 89.5  89.0  88.1   HCT 36.0 - 46.0 % 31.9  32.0  36.2   Platelets 150 - 400 K/uL 329   376   NEUT# 1.7 - 7.7 K/uL 3.3   8.8   Lymph# 0.7 - 4.0 K/uL 3.1   1.5      There is no height or weight on file to calculate BMI.  Orders:  No orders of the defined types were placed in this encounter.  No orders of the defined types were placed in this encounter.    Procedures: No procedures performed  Clinical Data: No additional findings.  ROS:  All other systems negative, except as noted in the HPI. Review of Systems  Objective: Vital Signs: There were no vitals taken for this visit.  Specialty Comments:  No specialty comments available.  PMFS History: Patient Active Problem List   Diagnosis Date Noted   Incontinence of feces 02/18/2024   Essential hypertension 12/17/2023   Cellulitis of buttock, left 12/17/2023   ARF (acute renal failure) 12/17/2023   Hyponatremia 12/17/2023   Non-pressure chronic ulcer of other part of right foot limited to breakdown of skin (HCC) 12/17/2023   Osteomyelitis (HCC) 12/16/2023   DKA, type 1, not at goal [facility]) 10/12/2023   Abdominal pain 10/12/2023   Dehydration 10/12/2023   Hypokalemia 06/29/2023   DKA (diabetic ketoacidosis) (HCC) 06/28/2023   Chronic allergic conjunctivitis 06/28/2023   Vasomotor rhinitis 06/28/2023   Tachycardia 06/28/2023   Proteinuria 06/28/2023   Elevated lipase 06/28/2023   Mild protein malnutrition 06/28/2023   Anemia 06/28/2023   Prolonged QT interval 06/28/2023   Hypophosphatemia 06/28/2023   Lactic acidosis 06/28/2023   Intractable diarrhea 06/27/2023   AKI (acute kidney injury) 03/06/2023    Osteomyelitis of great toe of left foot (HCC) 01/15/2023   Cellulitis in diabetic foot (HCC) 01/15/2023   Hyperlipidemia 10/06/2020   Diabetic polyneuropathy associated with type 1 diabetes mellitus (HCC) 08/18/2018   Chronic post-traumatic stress disorder (PTSD) 05/26/2017   Pneumococcal vaccination declined 11/15/2014   Type 1 diabetes mellitus with hyperglycemia (HCC) 07/23/2010   Past Medical History:  Diagnosis Date   ADHD (attention deficit hyperactivity disorder)    AKI (acute kidney injury) 10/06/2020   Anemia    Blood transfusion without reported diagnosis    Constipation    Diabetes mellitus type 1, uncontrolled    DKA (diabetic ketoacidosis) (HCC) 10/06/2020   GERD (gastroesophageal reflux disease)    Hyperlipidemia    Incontinence, feces     Family  History  Problem Relation Age of Onset   Heart disease Mother    Congestive Heart Failure Mother    Congestive Heart Failure Sister    Diabetes Maternal Grandmother    Colon polyps Neg Hx    Crohn's disease Neg Hx    Esophageal cancer Neg Hx    Rectal cancer Neg Hx    Stomach cancer Neg Hx     Past Surgical History:  Procedure Laterality Date   ANAL RECTAL MANOMETRY N/A 07/29/2023   Procedure: ANO RECTAL MANOMETRY;  Surgeon: Nandigam, Kavitha V, MD;  Location: [facility];  Service: Gastroenterology;  Laterality: N/A;   I & D EXTREMITY Right 03/09/2021   Procedure: IRRIGATION AND DEBRIDEMENT RIGHT RING FINGER;  Surgeon: Murrell Drivers, MD;  Location: [facility];  Service: Orthopedics;  Laterality: Right;   TUBAL LIGATION     Social History   Occupational History   Not on file  Tobacco Use   Smoking status: Some Days    Current packs/day: 0.25    Average packs/day: 0.3 packs/day for 2.0 years (0.5 ttl pk-yrs)    Types: Cigarettes   Smokeless tobacco: Never   Tobacco comments:    also uses Black and Mild  Vaping Use   Vaping status: Never Used  Substance and Sexual Activity   Alcohol use: Never   Drug use: Yes     Comment: CBD edibles   Sexual activity: Yes    Birth control/protection: Surgical    Comment: Tubal Ligation

## 2024-05-02 ENCOUNTER — Encounter: Payer: Self-pay | Admitting: Radiology

## 2024-05-16 ENCOUNTER — Emergency Department (HOSPITAL_COMMUNITY): Admission: EM | Admit: 2024-05-16 | Discharge: 2024-05-17 | Discharge status: Against Medical Advice

## 2024-05-16 ENCOUNTER — Other Ambulatory Visit: Payer: Self-pay

## 2024-05-16 ENCOUNTER — Encounter (HOSPITAL_COMMUNITY): Payer: Self-pay

## 2024-05-16 ENCOUNTER — Emergency Department (HOSPITAL_COMMUNITY)

## 2024-05-16 DIAGNOSIS — E119 Type 2 diabetes mellitus without complications: Secondary | ICD-10-CM | POA: Insufficient documentation

## 2024-05-16 DIAGNOSIS — Z5321 Procedure and treatment not carried out due to patient leaving prior to being seen by health care provider: Secondary | ICD-10-CM | POA: Insufficient documentation

## 2024-05-16 DIAGNOSIS — L03116 Cellulitis of left lower limb: Secondary | ICD-10-CM | POA: Diagnosis present

## 2024-05-16 LAB — CBC WITH DIFFERENTIAL/PLATELET
Abs Immature Granulocytes: 0.04 K/uL (ref 0.00–0.07)
Basophils Absolute: 0.1 K/uL (ref 0.0–0.1)
Basophils Relative: 1 %
Eosinophils Absolute: 0.1 K/uL (ref 0.0–0.5)
Eosinophils Relative: 1 %
HCT: 33.9 % — ABNORMAL LOW (ref 36.0–46.0)
Hemoglobin: 11 g/dL — ABNORMAL LOW (ref 12.0–15.0)
Immature Granulocytes: 0 %
Lymphocytes Relative: 13 %
Lymphs Abs: 1.6 K/uL (ref 0.7–4.0)
MCH: 28.7 pg (ref 26.0–34.0)
MCHC: 32.4 g/dL (ref 30.0–36.0)
MCV: 88.5 fL (ref 80.0–100.0)
Monocytes Absolute: 0.9 K/uL (ref 0.1–1.0)
Monocytes Relative: 7 %
Neutro Abs: 9.6 K/uL — ABNORMAL HIGH (ref 1.7–7.7)
Neutrophils Relative %: 78 %
Platelets: 275 K/uL (ref 150–400)
RBC: 3.83 MIL/uL — ABNORMAL LOW (ref 3.87–5.11)
RDW: 14.3 % (ref 11.5–15.5)
WBC: 12.3 K/uL — ABNORMAL HIGH (ref 4.0–10.5)
nRBC: 0 % (ref 0.0–0.2)

## 2024-05-16 LAB — BASIC METABOLIC PANEL WITH GFR
Anion gap: 10 (ref 5–15)
BUN: 32 mg/dL — ABNORMAL HIGH (ref 6–20)
CO2: 24 mmol/L (ref 22–32)
Calcium: 9 mg/dL (ref 8.9–10.3)
Chloride: 104 mmol/L (ref 98–111)
Creatinine, Ser: 0.96 mg/dL (ref 0.44–1.00)
GFR, Estimated: >60 mL/min (ref >60)
Glucose, Bld: 152 mg/dL — ABNORMAL HIGH (ref 70–99)
Potassium: 3.9 mmol/L (ref 3.5–5.1)
Sodium: 138 mmol/L (ref 135–145)

## 2024-05-16 LAB — I-STAT CG4 LACTIC ACID, ED: Lactic Acid, Venous: 1.7 mmol/L (ref 0.5–1.9)

## 2024-05-16 NOTE — ED Notes (Signed)
 Pt said she is diabetic and went to cafeteria for food because sugar is dropping

## 2024-05-16 NOTE — ED Triage Notes (Addendum)
 Pt c/o infection to left foot near big toe that first appeared 3 days ago that has continued to worsen. Also reports numbness to foot. Hx diabetes

## 2024-05-16 NOTE — ED Triage Notes (Addendum)
 Pt has redness and swelling to left big toe, purulent drainage with foul odor starting yesterday. Pain when walking. Has been seen for same in July, stepped on something and has wound that has not healed since. Pt endorses neuropathy.

## 2024-05-16 NOTE — ED Provider Triage Note (Signed)
 Emergency Medicine Provider Triage Evaluation Note  MILAYA HORA , a 48 y.o. female  was evaluated in triage.  Pt complains of left hallux swelling and deep infection; diabetic and following w/ Dr Harden w/ surgery.  Review of Systems  Positive: As above  Negative:   Physical Exam  BP (!) 157/87 (BP Location: Right Arm)   Pulse 98   Temp 98.4 F (36.9 C)   Resp 17   Ht 5' 4 (1.626 m)   Wt 62.6 kg   SpO2 95%   BMI 23.69 kg/m  Gen:   Awake, no distress   Resp:  Normal effort MSK:   Moves extremities without difficulty  Other:  Left hallux has a deep wound at the first MTP, profound swelling and edema noted.  Picture attached    Medical Decision Making  Medically screening exam initiated at 1:51 PM.  Appropriate orders placed.  Hadassah SHAUNNA Bless was informed that the remainder of the evaluation will be completed by another provider, this initial triage assessment does not replace that evaluation, and the importance of remaining in the ED until their evaluation is complete.  Initial imaging and lab orders placed.   Myriam Dorn BROCKS, [place] 05/16/24 [id number]

## 2024-05-16 NOTE — ED Notes (Signed)
 Extra Blue, SST, Red & DG tube drawn

## 2024-05-16 NOTE — ED Notes (Addendum)
 Pt was seen cleaning her infection on her foot with a sanitizing wipe told her I would not recommend doing that she continued to do so.

## 2024-05-16 NOTE — ED Notes (Signed)
 Pt stated she is leaving and will try to get her results from her doctor

## 2024-05-16 NOTE — ED Notes (Signed)
 Pt stated she needs to go get some food because she is diabetic and her sugar is dropping

## 2024-05-17 ENCOUNTER — Ambulatory Visit: Admitting: Family

## 2024-05-17 ENCOUNTER — Encounter: Payer: Self-pay | Admitting: Family

## 2024-05-17 DIAGNOSIS — L03032 Cellulitis of left toe: Secondary | ICD-10-CM

## 2024-05-17 DIAGNOSIS — L97521 Non-pressure chronic ulcer of other part of left foot limited to breakdown of skin: Secondary | ICD-10-CM | POA: Diagnosis not present

## 2024-05-17 MED ORDER — DOXYCYCLINE HYCLATE 100 MG PO TABS: 100.0000 mg | ORAL_TABLET | Freq: Two times a day (BID) | ORAL | 0 refills | Status: AC

## 2024-05-17 MED ORDER — CIPROFLOXACIN HCL 500 MG PO TABS: 500.0000 mg | ORAL_TABLET | Freq: Two times a day (BID) | ORAL | 0 refills | Status: AC

## 2024-05-17 NOTE — Progress Notes (Signed)
 Office Visit Note   Patient: Connie Vaughn           Date of Birth: March 19, 1976           MRN: [id number] Visit Date: 05/17/2024              Requested by: Dayna Motto, DO [address] [place],  [place] [address] PCP: Pcp, No  No chief complaint on file.     HPI: The patient is a 48 year old woman who is seen today for concern of infection to her left great toe  Has had an ulcer for quite some time this was present on her last hospitalization in September of this year has not yet healed.  At that time she also had issues with her right foot.  She was discharged on Cipro  and Doxy  This week has had return of redness swelling and significant pain to the ulcer to the medial border of the left great toe.  She has not had any fevers or chills she did report to the emergency department yesterday she had radiographs as well as some lab work she did not stay for the completion of her visit she reports she was in the waiting room for many hours and eventually went home  Assessment & Plan: Visit Diagnoses: No diagnosis found.  Plan: Will resume the Cipro  and Doxy.  Antibacterial ointment dressing changes after daily Dial  soap cleansing.  Radiographs were reassuring.  Discussed option of MRI of the left foot.  Today patient declined.  Discussed return precautions.  Follow-Up Instructions: No follow-ups on file.   Ortho Exam  Patient is alert, oriented, no adenopathy, well-dressed, normal affect, normal respiratory effort. On examination left foot there is an ulcer over the medial border of the great toe the ulceration is 5 mm in diameter 5 mm deep this does not probe to bone or tendon there is some surrounding maceration and mild erythema there is no palpable fluctuance.  The erythema has not extended beyond the area which was marked yesterday in the ED    Imaging: DG Foot Complete Left Result Date: 05/16/2024 CLINICAL DATA:  Left big toe wound with redness and swelling. EXAM:  LEFT FOOT - COMPLETE 3+ VIEW COMPARISON:  Left foot radiographs 03/06/2023. FINDINGS: Soft tissue wound at the medial great toe with surrounding swelling, overlying the level of the interphalangeal joint. No evidence of acute osteolysis or erosive changes of the underlying bone to suggest acute osteomyelitis. No acute fracture or dislocation. Hallux valgus with mild first MTP degenerative changes and subcortical cysts again noted in the first metatarsal head. IMPRESSION: 1. Soft tissue wound at the medial great toe with surrounding swelling, overlying the level of the interphalangeal joint. No radiographic evidence of acute osteomyelitis. 2. Hallux valgus with mild first MTP degenerative changes. Electronically Signed   By: Harrietta Sherry M.D.   On: 05/16/2024 14:46   No images are attached to the encounter.  Labs: Lab Results  Component Value Date   HGBA1C 11.4 (H) 10/12/2023   HGBA1C 11.4 (H) 01/15/2023   HGBA1C 11.1 (H) 10/07/2020   ESRSEDRATE 44 (H) 03/06/2023   ESRSEDRATE 50 (H) 01/16/2023   CRP 0.7 03/06/2023   CRP 0.8 01/16/2023   LABURIC 4.2 01/16/2023   REPTSTATUS PENDING 05/16/2024   GRAMSTAIN  03/09/2021    ABUNDANT WBC PRESENT, PREDOMINANTLY PMN RARE GRAM POSITIVE COCCI    CULT  05/16/2024    NO GROWTH < 24 HOURS Performed at [facility],  [address] [place], [place] [address]    [facility] STAPHYLOCOCCUS AUREUS 03/09/2021     Lab Results  Component Value Date   ALBUMIN 3.3 (L) 12/16/2023   ALBUMIN 3.8 12/16/2023   ALBUMIN 3.1 (L) 10/12/2023    Lab Results  Component Value Date   MG 1.9 10/15/2023   MG 1.9 10/14/2023   MG 2.0 10/12/2023   No results found for: VD25OH  No results found for: PREALBUMIN    Latest Ref Rng & Units 05/16/2024    1:53 PM 12/17/2023    5:19 AM 12/16/2023    5:37 PM  CBC EXTENDED  WBC 4.0 - 10.5 K/uL 12.3  7.2    RBC 3.87 - 5.11 MIL/uL 3.83  3.59    Hemoglobin 12.0 - 15.0 g/dL 88.9  89.5  89.0   HCT 36.0 - 46.0 %  33.9  31.9  32.0   Platelets 150 - 400 K/uL 275  329    NEUT# 1.7 - 7.7 K/uL 9.6  3.3    Lymph# 0.7 - 4.0 K/uL 1.6  3.1       There is no height or weight on file to calculate BMI.  Orders:  No orders of the defined types were placed in this encounter.  No orders of the defined types were placed in this encounter.    Procedures: No procedures performed  Clinical Data: No additional findings.  ROS:  All other systems negative, except as noted in the HPI. Review of Systems  Objective: Vital Signs: There were no vitals taken for this visit.  Specialty Comments:  No specialty comments available.  PMFS History: Patient Active Problem List   Diagnosis Date Noted   Incontinence of feces 02/18/2024   Essential hypertension 12/17/2023   Cellulitis of buttock, left 12/17/2023   ARF (acute renal failure) 12/17/2023   Hyponatremia 12/17/2023   Non-pressure chronic ulcer of other part of right foot limited to breakdown of skin (HCC) 12/17/2023   Osteomyelitis (HCC) 12/16/2023   DKA, type 1, not at goal [facility]) 10/12/2023   Abdominal pain 10/12/2023   Dehydration 10/12/2023   Hypokalemia 06/29/2023   DKA (diabetic ketoacidosis) (HCC) 06/28/2023   Chronic allergic conjunctivitis 06/28/2023   Vasomotor rhinitis 06/28/2023   Tachycardia 06/28/2023   Proteinuria 06/28/2023   Elevated lipase 06/28/2023   Mild protein malnutrition 06/28/2023   Anemia 06/28/2023   Prolonged QT interval 06/28/2023   Hypophosphatemia 06/28/2023   Lactic acidosis 06/28/2023   Intractable diarrhea 06/27/2023   AKI (acute kidney injury) 03/06/2023   Osteomyelitis of great toe of left foot (HCC) 01/15/2023   Cellulitis in diabetic foot (HCC) 01/15/2023   Hyperlipidemia 10/06/2020   Diabetic polyneuropathy associated with type 1 diabetes mellitus (HCC) 08/18/2018   Chronic post-traumatic stress disorder (PTSD) 05/26/2017   Pneumococcal vaccination declined 11/15/2014   Type 1 diabetes mellitus with  hyperglycemia (HCC) 07/23/2010   Past Medical History:  Diagnosis Date   ADHD (attention deficit hyperactivity disorder)    AKI (acute kidney injury) 10/06/2020   Anemia    Blood transfusion without reported diagnosis    Constipation    Diabetes mellitus type 1, uncontrolled    DKA (diabetic ketoacidosis) (HCC) 10/06/2020   GERD (gastroesophageal reflux disease)    Hyperlipidemia    Incontinence, feces     Family History  Problem Relation Age of Onset   Heart disease Mother    Congestive Heart Failure Mother    Congestive Heart Failure Sister    Diabetes Maternal Grandmother  Colon polyps Neg Hx    Crohn's disease Neg Hx    Esophageal cancer Neg Hx    Rectal cancer Neg Hx    Stomach cancer Neg Hx     Past Surgical History:  Procedure Laterality Date   ANAL RECTAL MANOMETRY N/A 07/29/2023   Procedure: ANO RECTAL MANOMETRY;  Surgeon: Nandigam, Kavitha V, MD;  Location: [facility];  Service: Gastroenterology;  Laterality: N/A;   I & D EXTREMITY Right 03/09/2021   Procedure: IRRIGATION AND DEBRIDEMENT RIGHT RING FINGER;  Surgeon: Murrell Drivers, MD;  Location: [facility] OR;  Service: Orthopedics;  Laterality: Right;   TUBAL LIGATION     Social History   Occupational History   Not on file  Tobacco Use   Smoking status: Some Days    Current packs/day: 0.25    Average packs/day: 0.3 packs/day for 2.0 years (0.5 ttl pk-yrs)    Types: Cigarettes   Smokeless tobacco: Never   Tobacco comments:    also uses Black and Mild  Vaping Use   Vaping status: Never Used  Substance and Sexual Activity   Alcohol use: Never   Drug use: Yes    Comment: CBD edibles   Sexual activity: Yes    Birth control/protection: Surgical    Comment: Tubal Ligation

## 2024-05-18 ENCOUNTER — Telehealth: Payer: Self-pay | Admitting: Orthopedic Surgery

## 2024-05-18 NOTE — Telephone Encounter (Signed)
 Yes, gave her the work note to be out for one week. I will get her scheduled for follow up with Dr. Harden when he returns to the office.

## 2024-05-18 NOTE — Telephone Encounter (Signed)
 Pt called saying that her work is requiring her to have a note for the boot that she has to wear on her left foot. Her employer is also saying that the boot is too soft and she needs a hard one Call back number is [phone]

## 2024-05-21 LAB — CULTURE, BLOOD (ROUTINE X 2): Culture: NO GROWTH

## 2024-05-23 ENCOUNTER — Telehealth: Payer: Self-pay | Admitting: Orthopedic Surgery

## 2024-05-23 NOTE — Telephone Encounter (Signed)
 Pt called stated Harden was to see her back in a week. Pt states Harden nurse told her they would email her with an appt. Please call pt at [phone].

## 2024-05-23 NOTE — Telephone Encounter (Signed)
 See previous message

## 2024-05-24 ENCOUNTER — Other Ambulatory Visit: Payer: Self-pay | Admitting: Family

## 2024-05-24 NOTE — Telephone Encounter (Signed)
Letter ready at the front desk.

## 2024-05-24 NOTE — Telephone Encounter (Signed)
 Pt coming in for her appt on 05/30/24. She wants a letter to return to work as  no one else pays her bills. I let her know that won't be a problem I can write her a note to return to work and she can pick it up after lunch today.

## 2024-05-30 ENCOUNTER — Ambulatory Visit: Admitting: Orthopedic Surgery

## 2024-05-30 DIAGNOSIS — L97521 Non-pressure chronic ulcer of other part of left foot limited to breakdown of skin: Secondary | ICD-10-CM

## 2024-05-30 DIAGNOSIS — E1042 Type 1 diabetes mellitus with diabetic polyneuropathy: Secondary | ICD-10-CM

## 2024-05-30 DIAGNOSIS — L97511 Non-pressure chronic ulcer of other part of right foot limited to breakdown of skin: Secondary | ICD-10-CM

## 2024-05-31 ENCOUNTER — Encounter: Payer: Self-pay | Admitting: Orthopedic Surgery

## 2024-05-31 NOTE — Progress Notes (Signed)
 Office Visit Note   Patient: Connie Vaughn           Date of Birth: [birth year]           MRN: [id number] Visit Date: 05/30/2024              Requested by: No referring provider defined for this encounter. PCP: Pcp, No  Chief Complaint  Patient presents with   Left Foot - Wound Check      HPI: Discussed the use of AI scribe software for clinical note transcription with the patient, who gave verbal consent to proceed.  History of Present Illness Connie Vaughn is a 48 year old female who presents with concerns about calluses and foot size changes.  She has developed a callus on the second toe of her right foot, which she attributes to the toe hitting the end of her shoe. She mentions that she has worn size eight and a half shoes since the fifth grade, but now needs a size ten due to changes in her foot size.  Her left foot has a callus that is still bleeding slightly. She has been keeping a Band-Aid on it for protection and is concerned that the infection seemed to have gone deeper into the foot.  She has been taking antibiotics and has one day left of the medication. She is unsure if it is still necessary to continue the medication.  She works in an environment where many elderly people shop, which is where she overheard information about foot growth related to menopause and pregnancy.     Assessment & Plan: Visit Diagnoses:  1. Ulcer of toe of left foot, limited to breakdown of skin (HCC)   2. Diabetic polyneuropathy associated with type 1 diabetes mellitus (HCC)   3. Chronic ulcer of great toe of right foot, limited to breakdown of skin (HCC)     Plan: Assessment and Plan Assessment & Plan Wagner grade 1 ulcer of left great toe plantar aspect Wagner grade 1 ulcer on the plantar aspect of the IP joint of the left great toe with good petechial bleeding. The open wound measures 1 mm by 5 mm. The ulcer is healing well with no signs of infection. - Applied Band-Aid to  the ulcer. - Recommended wider and longer shoe wear to accommodate foot structure.  Callus of right second toe Callus on the right second toe due to impingement at the end of the shoe. The callus was pared without complication. - Trimmed callus on the right second toe.  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 - Recommended wider and longer shoe wear to accommodate foot structure.      Follow-Up Instructions: No follow-ups on file.   Ortho Exam  Patient is alert, oriented, no adenopathy, well-dressed, normal affect, normal respiratory effort. Physical Exam EXTREMITIES: Right foot normal. Callus on second toe of right foot from shoe impingement. Callus on right foot paired without complication. Wagner grade 1 ulcer on plantar aspect of left great toe IP joint. Callus on left foot paired. Good petechial bleeding on left foot. Open wound on left foot 1mm x 5mm. First metatarsal head completely healed.      Imaging: No results found. No images are attached to the encounter.  Labs: Lab Results  Component Value Date   HGBA1C 11.4 (H) 10/12/2023   HGBA1C 11.4 (H) 01/15/2023  HGBA1C 11.1 (H) 10/07/2020   ESRSEDRATE 44 (H) 03/06/2023   ESRSEDRATE 50 (H) 01/16/2023   CRP 0.7 03/06/2023   CRP 0.8 01/16/2023   LABURIC 4.2 01/16/2023   REPTSTATUS 05/21/2024 FINAL 05/16/2024   GRAMSTAIN  03/09/2021    ABUNDANT WBC PRESENT, PREDOMINANTLY PMN RARE GRAM POSITIVE COCCI    CULT  05/16/2024    NO GROWTH 5 DAYS Performed at [facility], [address] [place], [place] [address]    [facility] STAPHYLOCOCCUS AUREUS 03/09/2021     Lab Results  Component Value Date   ALBUMIN 3.3 (L) 12/16/2023   ALBUMIN 3.8 12/16/2023   ALBUMIN 3.1 (L) 10/12/2023    Lab Results  Component Value Date   MG 1.9 10/15/2023   MG 1.9 10/14/2023   MG 2.0 10/12/2023   No results  found for: VD25OH  No results found for: PREALBUMIN    Latest Ref Rng & Units 05/16/2024    1:53 PM 12/17/2023    5:19 AM 12/16/2023    5:37 PM  CBC EXTENDED  WBC 4.0 - 10.5 K/uL 12.3  7.2    RBC 3.87 - 5.11 MIL/uL 3.83  3.59    Hemoglobin 12.0 - 15.0 g/dL 88.9  89.5  89.0   HCT 36.0 - 46.0 % 33.9  31.9  32.0   Platelets 150 - 400 K/uL 275  329    NEUT# 1.7 - 7.7 K/uL 9.6  3.3    Lymph# 0.7 - 4.0 K/uL 1.6  3.1       There is no height or weight on file to calculate BMI.  Orders:  No orders of the defined types were placed in this encounter.  No orders of the defined types were placed in this encounter.    Procedures: No procedures performed  Clinical Data: No additional findings.  ROS:  All other systems negative, except as noted in the HPI. Review of Systems  Objective: Vital Signs: There were no vitals taken for this visit.  Specialty Comments:  No specialty comments available.  PMFS History: Patient Active Problem List   Diagnosis Date Noted   Incontinence of feces 02/18/2024   Essential hypertension 12/17/2023   Cellulitis of buttock, left 12/17/2023   ARF (acute renal failure) 12/17/2023   Hyponatremia 12/17/2023   Non-pressure chronic ulcer of other part of right foot limited to breakdown of skin (HCC) 12/17/2023   Osteomyelitis (HCC) 12/16/2023   DKA, type 1, not at goal [facility]) 10/12/2023   Abdominal pain 10/12/2023   Dehydration 10/12/2023   Hypokalemia 06/29/2023   DKA (diabetic ketoacidosis) (HCC) 06/28/2023   Chronic allergic conjunctivitis 06/28/2023   Vasomotor rhinitis 06/28/2023   Tachycardia 06/28/2023   Proteinuria 06/28/2023   Elevated lipase 06/28/2023   Mild protein malnutrition 06/28/2023   Anemia 06/28/2023   Prolonged QT interval 06/28/2023   Hypophosphatemia 06/28/2023   Lactic acidosis 06/28/2023   Intractable diarrhea 06/27/2023   AKI (acute kidney injury) 03/06/2023   Osteomyelitis of great toe of left foot (HCC)  01/15/2023   Cellulitis in diabetic foot (HCC) 01/15/2023   Hyperlipidemia 10/06/2020   Diabetic polyneuropathy associated with type 1 diabetes mellitus (HCC) 08/18/2018   Chronic post-traumatic stress disorder (PTSD) 05/26/2017   Pneumococcal vaccination declined 11/15/2014   Type 1 diabetes mellitus with hyperglycemia (HCC) 07/23/2010   Past Medical History:  Diagnosis Date   ADHD (attention deficit hyperactivity disorder)    AKI (acute kidney injury) 10/06/2020   Anemia    Blood transfusion without reported diagnosis  Constipation    Diabetes mellitus type 1, uncontrolled    DKA (diabetic ketoacidosis) (HCC) 10/06/2020   GERD (gastroesophageal reflux disease)    Hyperlipidemia    Incontinence, feces     Family History  Problem Relation Age of Onset   Heart disease Mother    Congestive Heart Failure Mother    Congestive Heart Failure Sister    Diabetes Maternal Grandmother    Colon polyps Neg Hx    Crohn's disease Neg Hx    Esophageal cancer Neg Hx    Rectal cancer Neg Hx    Stomach cancer Neg Hx     Past Surgical History:  Procedure Laterality Date   ANAL RECTAL MANOMETRY N/A 07/29/2023   Procedure: ANO RECTAL MANOMETRY;  Surgeon: Nandigam, Kavitha V, MD;  Location: [facility];  Service: Gastroenterology;  Laterality: N/A;   I & D EXTREMITY Right 03/09/2021   Procedure: IRRIGATION AND DEBRIDEMENT RIGHT RING FINGER;  Surgeon: Murrell Drivers, MD;  Location: [facility] OR;  Service: Orthopedics;  Laterality: Right;   TUBAL LIGATION     Social History   Occupational History   Not on file  Tobacco Use   Smoking status: Some Days    Current packs/day: 0.25    Average packs/day: 0.3 packs/day for 2.0 years (0.5 ttl pk-yrs)    Types: Cigarettes   Smokeless tobacco: Never   Tobacco comments:    also uses Black and Mild  Vaping Use   Vaping status: Never Used  Substance and Sexual Activity   Alcohol use: Never   Drug use: Yes    Comment: CBD edibles   Sexual activity: Yes     Birth control/protection: Surgical    Comment: Tubal Ligation
